# Patient Record
Sex: Female | Born: 1935
Health system: Southern US, Community
[De-identification: ages and names within clinical notes are randomized; demographics above are authoritative.]

## PROBLEM LIST (undated history)

## (undated) DIAGNOSIS — Z8719 Personal history of other diseases of the digestive system: Secondary | ICD-10-CM

## (undated) DIAGNOSIS — A159 Respiratory tuberculosis unspecified: Secondary | ICD-10-CM

## (undated) DIAGNOSIS — K222 Esophageal obstruction: Secondary | ICD-10-CM

## (undated) DIAGNOSIS — K5730 Diverticulosis of large intestine without perforation or abscess without bleeding: Secondary | ICD-10-CM

## (undated) DIAGNOSIS — F329 Major depressive disorder, single episode, unspecified: Secondary | ICD-10-CM

## (undated) DIAGNOSIS — I10 Essential (primary) hypertension: Secondary | ICD-10-CM

## (undated) DIAGNOSIS — M1990 Unspecified osteoarthritis, unspecified site: Secondary | ICD-10-CM

## (undated) DIAGNOSIS — D649 Anemia, unspecified: Secondary | ICD-10-CM

## (undated) DIAGNOSIS — G709 Myoneural disorder, unspecified: Secondary | ICD-10-CM

## (undated) DIAGNOSIS — E039 Hypothyroidism, unspecified: Secondary | ICD-10-CM

## (undated) DIAGNOSIS — F419 Anxiety disorder, unspecified: Secondary | ICD-10-CM

## (undated) DIAGNOSIS — C801 Malignant (primary) neoplasm, unspecified: Secondary | ICD-10-CM

## (undated) DIAGNOSIS — K219 Gastro-esophageal reflux disease without esophagitis: Secondary | ICD-10-CM

## (undated) DIAGNOSIS — K253 Acute gastric ulcer without hemorrhage or perforation: Secondary | ICD-10-CM

## (undated) DIAGNOSIS — K635 Polyp of colon: Secondary | ICD-10-CM

## (undated) DIAGNOSIS — F32A Depression, unspecified: Secondary | ICD-10-CM

## (undated) HISTORY — PX: EYE SURGERY: SHX253

## (undated) HISTORY — DX: Diverticulosis of large intestine without perforation or abscess without bleeding: K57.30

## (undated) HISTORY — PX: BREAST SURGERY: SHX581

## (undated) HISTORY — PX: MASTECTOMY: SHX3

## (undated) HISTORY — DX: Polyp of colon: K63.5

## (undated) HISTORY — DX: Esophageal obstruction: K22.2

## (undated) HISTORY — DX: Acute gastric ulcer without hemorrhage or perforation: K25.3

## (undated) HISTORY — PX: JOINT REPLACEMENT: SHX530

## (undated) HISTORY — PX: TONSILLECTOMY: SUR1361

---

## 1999-10-27 ENCOUNTER — Encounter: Admission: RE | Admit: 1999-10-27 | Discharge: 1999-11-30 | Payer: Self-pay | Admitting: Internal Medicine

## 1999-12-11 ENCOUNTER — Inpatient Hospital Stay (HOSPITAL_COMMUNITY): Admission: EM | Admit: 1999-12-11 | Discharge: 1999-12-13 | Payer: Self-pay

## 1999-12-11 ENCOUNTER — Encounter: Payer: Self-pay | Admitting: Gastroenterology

## 2000-01-04 DIAGNOSIS — K253 Acute gastric ulcer without hemorrhage or perforation: Secondary | ICD-10-CM

## 2000-01-04 DIAGNOSIS — Z8719 Personal history of other diseases of the digestive system: Secondary | ICD-10-CM

## 2000-01-04 DIAGNOSIS — K222 Esophageal obstruction: Secondary | ICD-10-CM

## 2000-01-04 HISTORY — DX: Esophageal obstruction: K22.2

## 2000-01-04 HISTORY — DX: Personal history of other diseases of the digestive system: Z87.19

## 2000-01-04 HISTORY — DX: Acute gastric ulcer without hemorrhage or perforation: K25.3

## 2000-02-16 ENCOUNTER — Encounter: Payer: Self-pay | Admitting: Gastroenterology

## 2000-02-16 ENCOUNTER — Other Ambulatory Visit: Admission: RE | Admit: 2000-02-16 | Discharge: 2000-02-16 | Payer: Self-pay | Admitting: Gastroenterology

## 2000-02-16 ENCOUNTER — Encounter (INDEPENDENT_AMBULATORY_CARE_PROVIDER_SITE_OTHER): Payer: Self-pay | Admitting: Specialist

## 2000-04-25 ENCOUNTER — Encounter: Payer: Self-pay | Admitting: Gastroenterology

## 2001-08-01 ENCOUNTER — Encounter: Payer: Self-pay | Admitting: Gastroenterology

## 2001-08-28 ENCOUNTER — Encounter: Payer: Self-pay | Admitting: Gastroenterology

## 2001-12-19 ENCOUNTER — Other Ambulatory Visit: Admission: RE | Admit: 2001-12-19 | Discharge: 2001-12-19 | Payer: Self-pay | Admitting: Obstetrics and Gynecology

## 2002-05-08 ENCOUNTER — Ambulatory Visit (HOSPITAL_COMMUNITY): Admission: RE | Admit: 2002-05-08 | Discharge: 2002-05-08 | Payer: Self-pay | Admitting: Obstetrics and Gynecology

## 2002-05-08 ENCOUNTER — Encounter (INDEPENDENT_AMBULATORY_CARE_PROVIDER_SITE_OTHER): Payer: Self-pay | Admitting: *Deleted

## 2003-11-18 ENCOUNTER — Ambulatory Visit: Payer: Self-pay | Admitting: Internal Medicine

## 2004-01-20 ENCOUNTER — Ambulatory Visit: Payer: Self-pay | Admitting: Internal Medicine

## 2004-01-26 ENCOUNTER — Ambulatory Visit (HOSPITAL_COMMUNITY): Admission: RE | Admit: 2004-01-26 | Discharge: 2004-01-26 | Payer: Self-pay | Admitting: Internal Medicine

## 2004-01-26 ENCOUNTER — Ambulatory Visit: Payer: Self-pay | Admitting: Internal Medicine

## 2004-02-05 ENCOUNTER — Ambulatory Visit: Payer: Self-pay

## 2004-09-16 ENCOUNTER — Ambulatory Visit: Payer: Self-pay | Admitting: Internal Medicine

## 2004-10-08 ENCOUNTER — Ambulatory Visit: Payer: Self-pay | Admitting: Physical Medicine & Rehabilitation

## 2004-10-08 ENCOUNTER — Inpatient Hospital Stay (HOSPITAL_COMMUNITY): Admission: RE | Admit: 2004-10-08 | Discharge: 2004-10-12 | Payer: Self-pay | Admitting: Orthopaedic Surgery

## 2004-10-12 ENCOUNTER — Inpatient Hospital Stay
Admission: RE | Admit: 2004-10-12 | Discharge: 2004-10-19 | Payer: Self-pay | Admitting: Physical Medicine & Rehabilitation

## 2004-11-22 ENCOUNTER — Ambulatory Visit: Payer: Self-pay | Admitting: Internal Medicine

## 2005-08-09 ENCOUNTER — Ambulatory Visit: Payer: Self-pay | Admitting: Internal Medicine

## 2005-09-01 ENCOUNTER — Ambulatory Visit: Payer: Self-pay | Admitting: Licensed Clinical Social Worker

## 2005-09-19 ENCOUNTER — Ambulatory Visit: Payer: Self-pay | Admitting: Licensed Clinical Social Worker

## 2005-10-10 ENCOUNTER — Ambulatory Visit: Payer: Self-pay | Admitting: Licensed Clinical Social Worker

## 2006-04-26 ENCOUNTER — Ambulatory Visit: Payer: Self-pay | Admitting: Internal Medicine

## 2006-04-27 LAB — CONVERTED CEMR LAB
ALT: 23 units/L (ref 0–40)
AST: 42 units/L — ABNORMAL HIGH (ref 0–37)
Albumin: 3.6 g/dL (ref 3.5–5.2)
Alkaline Phosphatase: 55 units/L (ref 39–117)
BUN: 20 mg/dL (ref 6–23)
Basophils Absolute: 0 10*3/uL (ref 0.0–0.1)
Basophils Relative: 0.6 % (ref 0.0–1.0)
Bilirubin, Direct: 0.2 mg/dL (ref 0.0–0.3)
CO2: 31 meq/L (ref 19–32)
Calcium: 9 mg/dL (ref 8.4–10.5)
Chloride: 107 meq/L (ref 96–112)
Cholesterol: 133 mg/dL (ref 0–200)
Creatinine, Ser: 1 mg/dL (ref 0.4–1.2)
Eosinophils Absolute: 0.4 10*3/uL (ref 0.0–0.6)
Eosinophils Relative: 7.4 % — ABNORMAL HIGH (ref 0.0–5.0)
GFR calc Af Amer: 70 mL/min
GFR calc non Af Amer: 58 mL/min
Glucose, Bld: 107 mg/dL — ABNORMAL HIGH (ref 70–99)
HCT: 36.5 % (ref 36.0–46.0)
HDL: 54.4 mg/dL (ref >39.0)
Hemoglobin: 12 g/dL (ref 12.0–15.0)
LDL Cholesterol: 67 mg/dL (ref 0–99)
Lymphocytes Relative: 22.9 % (ref 12.0–46.0)
MCHC: 33 g/dL (ref 30.0–36.0)
MCV: 90.1 fL (ref 78.0–100.0)
Monocytes Absolute: 0.6 10*3/uL (ref 0.2–0.7)
Monocytes Relative: 12.1 % — ABNORMAL HIGH (ref 3.0–11.0)
Neutro Abs: 2.9 10*3/uL (ref 1.4–7.7)
Neutrophils Relative %: 57 % (ref 43.0–77.0)
Platelets: 252 10*3/uL (ref 150–400)
Potassium: 4.6 meq/L (ref 3.5–5.1)
RBC: 4.05 M/uL (ref 3.87–5.11)
RDW: 13.1 % (ref 11.5–14.6)
Sodium: 143 meq/L (ref 135–145)
TSH: 1.97 microintl units/mL (ref 0.35–5.50)
Total Bilirubin: 1 mg/dL (ref 0.3–1.2)
Total CHOL/HDL Ratio: 2.4
Total Protein: 7.1 g/dL (ref 6.0–8.3)
Triglycerides: 56 mg/dL (ref 0–149)
VLDL: 11 mg/dL (ref 0–40)
WBC: 5.1 10*3/uL (ref 4.5–10.5)

## 2006-05-30 ENCOUNTER — Ambulatory Visit: Payer: Self-pay | Admitting: Internal Medicine

## 2006-06-13 ENCOUNTER — Ambulatory Visit: Payer: Self-pay | Admitting: Internal Medicine

## 2006-06-13 LAB — CONVERTED CEMR LAB: Tissue Transglutaminase Ab, IgA: <1 units (ref <7)

## 2006-11-20 ENCOUNTER — Encounter: Payer: Self-pay | Admitting: Internal Medicine

## 2006-12-18 ENCOUNTER — Telehealth: Payer: Self-pay | Admitting: Internal Medicine

## 2007-03-13 ENCOUNTER — Encounter: Payer: Self-pay | Admitting: *Deleted

## 2007-03-13 DIAGNOSIS — G609 Hereditary and idiopathic neuropathy, unspecified: Secondary | ICD-10-CM | POA: Insufficient documentation

## 2007-03-13 DIAGNOSIS — E039 Hypothyroidism, unspecified: Secondary | ICD-10-CM

## 2007-03-13 DIAGNOSIS — F32A Depression, unspecified: Secondary | ICD-10-CM | POA: Insufficient documentation

## 2007-03-13 DIAGNOSIS — L719 Rosacea, unspecified: Secondary | ICD-10-CM | POA: Insufficient documentation

## 2007-03-13 DIAGNOSIS — K5730 Diverticulosis of large intestine without perforation or abscess without bleeding: Secondary | ICD-10-CM | POA: Insufficient documentation

## 2007-03-13 DIAGNOSIS — Z8719 Personal history of other diseases of the digestive system: Secondary | ICD-10-CM | POA: Insufficient documentation

## 2007-03-13 DIAGNOSIS — Z9189 Other specified personal risk factors, not elsewhere classified: Secondary | ICD-10-CM | POA: Insufficient documentation

## 2007-03-13 DIAGNOSIS — R051 Acute cough: Secondary | ICD-10-CM | POA: Insufficient documentation

## 2007-03-13 DIAGNOSIS — F988 Other specified behavioral and emotional disorders with onset usually occurring in childhood and adolescence: Secondary | ICD-10-CM | POA: Insufficient documentation

## 2007-03-13 DIAGNOSIS — R05 Cough: Secondary | ICD-10-CM

## 2007-03-13 DIAGNOSIS — K219 Gastro-esophageal reflux disease without esophagitis: Secondary | ICD-10-CM | POA: Insufficient documentation

## 2007-03-13 DIAGNOSIS — F329 Major depressive disorder, single episode, unspecified: Secondary | ICD-10-CM | POA: Insufficient documentation

## 2007-03-13 DIAGNOSIS — C50919 Malignant neoplasm of unspecified site of unspecified female breast: Secondary | ICD-10-CM | POA: Insufficient documentation

## 2007-03-13 DIAGNOSIS — M1990 Unspecified osteoarthritis, unspecified site: Secondary | ICD-10-CM

## 2007-03-13 DIAGNOSIS — I10 Essential (primary) hypertension: Secondary | ICD-10-CM | POA: Insufficient documentation

## 2007-03-13 DIAGNOSIS — E785 Hyperlipidemia, unspecified: Secondary | ICD-10-CM | POA: Insufficient documentation

## 2007-03-13 DIAGNOSIS — Z96659 Presence of unspecified artificial knee joint: Secondary | ICD-10-CM

## 2007-09-19 ENCOUNTER — Ambulatory Visit: Payer: Self-pay | Admitting: Internal Medicine

## 2007-09-19 DIAGNOSIS — K900 Celiac disease: Secondary | ICD-10-CM | POA: Insufficient documentation

## 2007-09-19 LAB — CONVERTED CEMR LAB
ALT: 29 units/L (ref 0–35)
AST: 36 units/L (ref 0–37)
Albumin: 4 g/dL (ref 3.5–5.2)
Alkaline Phosphatase: 44 units/L (ref 39–117)
BUN: 20 mg/dL (ref 6–23)
Basophils Absolute: 0 10*3/uL (ref 0.0–0.1)
Basophils Relative: 1 % (ref 0.0–3.0)
Bilirubin, Direct: 0.2 mg/dL (ref 0.0–0.3)
CO2: 29 meq/L (ref 19–32)
Calcium: 9.3 mg/dL (ref 8.4–10.5)
Chloride: 106 meq/L (ref 96–112)
Cholesterol: 180 mg/dL (ref 0–200)
Creatinine, Ser: 1 mg/dL (ref 0.4–1.2)
Eosinophils Absolute: 0.2 10*3/uL (ref 0.0–0.7)
Eosinophils Relative: 5.1 % — ABNORMAL HIGH (ref 0.0–5.0)
GFR calc Af Amer: 70 mL/min
GFR calc non Af Amer: 57 mL/min
Glucose, Bld: 96 mg/dL (ref 70–99)
HCT: 32.7 % — ABNORMAL LOW (ref 36.0–46.0)
HDL: 67.4 mg/dL (ref >39.0)
Hemoglobin: 10.9 g/dL — ABNORMAL LOW (ref 12.0–15.0)
LDL Cholesterol: 87 mg/dL (ref 0–99)
Lymphocytes Relative: 23.7 % (ref 12.0–46.0)
MCHC: 33.2 g/dL (ref 30.0–36.0)
MCV: 83 fL (ref 78.0–100.0)
Monocytes Absolute: 0.8 10*3/uL (ref 0.1–1.0)
Monocytes Relative: 15.7 % — ABNORMAL HIGH (ref 3.0–12.0)
Neutro Abs: 2.7 10*3/uL (ref 1.4–7.7)
Neutrophils Relative %: 54.5 % (ref 43.0–77.0)
Platelets: 327 10*3/uL (ref 150–400)
Potassium: 4.6 meq/L (ref 3.5–5.1)
RBC: 3.94 M/uL (ref 3.87–5.11)
RDW: 15.9 % — ABNORMAL HIGH (ref 11.5–14.6)
Sodium: 140 meq/L (ref 135–145)
TSH: 2.65 microintl units/mL (ref 0.35–5.50)
Total Bilirubin: 1.3 mg/dL — ABNORMAL HIGH (ref 0.3–1.2)
Total CHOL/HDL Ratio: 2.7
Total Protein: 7.3 g/dL (ref 6.0–8.3)
Triglycerides: 128 mg/dL (ref 0–149)
VLDL: 26 mg/dL (ref 0–40)
WBC: 4.8 10*3/uL (ref 4.5–10.5)

## 2007-09-28 ENCOUNTER — Encounter: Payer: Self-pay | Admitting: Internal Medicine

## 2007-10-02 ENCOUNTER — Encounter: Payer: Self-pay | Admitting: Internal Medicine

## 2007-10-15 ENCOUNTER — Encounter: Payer: Self-pay | Admitting: Internal Medicine

## 2007-12-11 ENCOUNTER — Telehealth (INDEPENDENT_AMBULATORY_CARE_PROVIDER_SITE_OTHER): Payer: Self-pay | Admitting: *Deleted

## 2007-12-14 ENCOUNTER — Encounter: Payer: Self-pay | Admitting: Internal Medicine

## 2007-12-17 ENCOUNTER — Encounter: Payer: Self-pay | Admitting: Internal Medicine

## 2008-01-04 DIAGNOSIS — K635 Polyp of colon: Secondary | ICD-10-CM

## 2008-01-04 HISTORY — DX: Polyp of colon: K63.5

## 2008-04-10 ENCOUNTER — Encounter: Payer: Self-pay | Admitting: Internal Medicine

## 2008-04-12 ENCOUNTER — Encounter: Payer: Self-pay | Admitting: Internal Medicine

## 2008-04-14 ENCOUNTER — Telehealth: Payer: Self-pay | Admitting: Internal Medicine

## 2008-04-14 ENCOUNTER — Ambulatory Visit: Payer: Self-pay | Admitting: Internal Medicine

## 2008-04-14 LAB — CONVERTED CEMR LAB
Basophils Absolute: 0.1 10*3/uL (ref 0.0–0.1)
Basophils Relative: 1.3 % (ref 0.0–3.0)
Eosinophils Absolute: 0.2 10*3/uL (ref 0.0–0.7)
Eosinophils Relative: 4.2 % (ref 0.0–5.0)
HCT: 33.7 % — ABNORMAL LOW (ref 36.0–46.0)
Hemoglobin: 10.9 g/dL — ABNORMAL LOW (ref 12.0–15.0)
Lymphocytes Relative: 25.5 % (ref 12.0–46.0)
Lymphs Abs: 1.4 10*3/uL (ref 0.7–4.0)
MCHC: 32.3 g/dL (ref 30.0–36.0)
MCV: 78.2 fL (ref 78.0–100.0)
Monocytes Absolute: 0.6 10*3/uL (ref 0.1–1.0)
Monocytes Relative: 10.7 % (ref 3.0–12.0)
Neutro Abs: 3.3 10*3/uL (ref 1.4–7.7)
Neutrophils Relative %: 58.3 % (ref 43.0–77.0)
Platelets: 424 10*3/uL — ABNORMAL HIGH (ref 150.0–400.0)
RBC: 4.31 M/uL (ref 3.87–5.11)
RDW: 15.5 % — ABNORMAL HIGH (ref 11.5–14.6)
WBC: 5.6 10*3/uL (ref 4.5–10.5)

## 2008-04-15 ENCOUNTER — Telehealth: Payer: Self-pay | Admitting: Internal Medicine

## 2008-04-17 ENCOUNTER — Ambulatory Visit: Payer: Self-pay | Admitting: Internal Medicine

## 2008-04-17 DIAGNOSIS — K319 Disease of stomach and duodenum, unspecified: Secondary | ICD-10-CM | POA: Insufficient documentation

## 2008-04-17 DIAGNOSIS — D649 Anemia, unspecified: Secondary | ICD-10-CM | POA: Insufficient documentation

## 2008-04-17 DIAGNOSIS — Z91013 Allergy to seafood: Secondary | ICD-10-CM

## 2008-04-17 DIAGNOSIS — K279 Peptic ulcer, site unspecified, unspecified as acute or chronic, without hemorrhage or perforation: Secondary | ICD-10-CM | POA: Insufficient documentation

## 2008-04-17 LAB — CONVERTED CEMR LAB
Basophils Absolute: 0.1 10*3/uL (ref 0.0–0.1)
Basophils Relative: 2.3 % (ref 0.0–3.0)
CEA: 1.5 ng/mL (ref 0.0–5.0)
Eosinophils Absolute: 0.2 10*3/uL (ref 0.0–0.7)
Eosinophils Relative: 4.3 % (ref 0.0–5.0)
Folate: >20 ng/mL
HCT: 31.3 % — ABNORMAL LOW (ref 36.0–46.0)
Hemoglobin: 10.6 g/dL — ABNORMAL LOW (ref 12.0–15.0)
Iron: 71 ug/dL (ref 42–145)
Lymphocytes Relative: 26.3 % (ref 12.0–46.0)
Lymphs Abs: 1.4 10*3/uL (ref 0.7–4.0)
MCHC: 33.8 g/dL (ref 30.0–36.0)
MCV: 77.8 fL — ABNORMAL LOW (ref 78.0–100.0)
Monocytes Absolute: 0.6 10*3/uL (ref 0.1–1.0)
Monocytes Relative: 10.4 % (ref 3.0–12.0)
Neutro Abs: 3 10*3/uL (ref 1.4–7.7)
Neutrophils Relative %: 56.7 % (ref 43.0–77.0)
Platelets: 399 10*3/uL (ref 150.0–400.0)
RBC: 4.03 M/uL (ref 3.87–5.11)
RDW: 15.8 % — ABNORMAL HIGH (ref 11.5–14.6)
Retic Ct Pct: 1.1 % (ref 0.4–3.1)
Saturation Ratios: 12.2 % — ABNORMAL LOW (ref 20.0–50.0)
Transferrin: 414 mg/dL — ABNORMAL HIGH (ref 212.0–360.0)
Vitamin B-12: 412 pg/mL (ref 211–911)
WBC: 5.3 10*3/uL (ref 4.5–10.5)

## 2008-04-22 ENCOUNTER — Ambulatory Visit: Payer: Self-pay | Admitting: Gastroenterology

## 2008-04-22 DIAGNOSIS — D509 Iron deficiency anemia, unspecified: Secondary | ICD-10-CM | POA: Insufficient documentation

## 2008-04-22 DIAGNOSIS — R1032 Left lower quadrant pain: Secondary | ICD-10-CM | POA: Insufficient documentation

## 2008-05-02 ENCOUNTER — Ambulatory Visit: Payer: Self-pay | Admitting: Gastroenterology

## 2008-05-02 ENCOUNTER — Encounter: Payer: Self-pay | Admitting: Gastroenterology

## 2008-05-07 ENCOUNTER — Encounter: Payer: Self-pay | Admitting: Gastroenterology

## 2008-07-02 ENCOUNTER — Telehealth: Payer: Self-pay | Admitting: Internal Medicine

## 2008-08-21 ENCOUNTER — Ambulatory Visit: Payer: Self-pay | Admitting: Internal Medicine

## 2008-08-21 DIAGNOSIS — R109 Unspecified abdominal pain: Secondary | ICD-10-CM

## 2008-08-22 ENCOUNTER — Ambulatory Visit: Payer: Self-pay | Admitting: Internal Medicine

## 2008-08-22 LAB — CONVERTED CEMR LAB
Bilirubin Urine: NEGATIVE
Hemoglobin, Urine: NEGATIVE
Ketones, ur: NEGATIVE mg/dL
Nitrite: NEGATIVE
Specific Gravity, Urine: 1.02 (ref 1.000–1.030)
Total Protein, Urine: NEGATIVE mg/dL
Urine Glucose: NEGATIVE mg/dL
Urobilinogen, UA: 0.2 (ref 0.0–1.0)
pH: 6 (ref 5.0–8.0)

## 2008-09-01 ENCOUNTER — Telehealth: Payer: Self-pay | Admitting: Internal Medicine

## 2008-09-02 ENCOUNTER — Ambulatory Visit: Payer: Self-pay | Admitting: Internal Medicine

## 2008-09-09 ENCOUNTER — Telehealth: Payer: Self-pay | Admitting: Internal Medicine

## 2008-09-15 ENCOUNTER — Telehealth: Payer: Self-pay | Admitting: Internal Medicine

## 2008-09-19 ENCOUNTER — Ambulatory Visit: Payer: Self-pay | Admitting: Internal Medicine

## 2008-09-26 ENCOUNTER — Telehealth: Payer: Self-pay | Admitting: Internal Medicine

## 2008-10-24 ENCOUNTER — Encounter: Payer: Self-pay | Admitting: Internal Medicine

## 2009-01-15 ENCOUNTER — Ambulatory Visit: Payer: Self-pay | Admitting: Internal Medicine

## 2009-01-16 ENCOUNTER — Inpatient Hospital Stay (HOSPITAL_COMMUNITY): Admission: EM | Admit: 2009-01-16 | Discharge: 2009-01-22 | Payer: Self-pay | Admitting: Internal Medicine

## 2009-01-16 ENCOUNTER — Ambulatory Visit: Payer: Self-pay | Admitting: Internal Medicine

## 2009-01-16 ENCOUNTER — Encounter: Payer: Self-pay | Admitting: Internal Medicine

## 2009-01-22 ENCOUNTER — Encounter: Payer: Self-pay | Admitting: Internal Medicine

## 2009-01-22 ENCOUNTER — Telehealth: Payer: Self-pay | Admitting: Internal Medicine

## 2009-01-23 ENCOUNTER — Encounter: Payer: Self-pay | Admitting: Internal Medicine

## 2009-01-29 ENCOUNTER — Ambulatory Visit: Payer: Self-pay | Admitting: Internal Medicine

## 2009-02-06 ENCOUNTER — Telehealth: Payer: Self-pay | Admitting: Internal Medicine

## 2009-02-20 ENCOUNTER — Ambulatory Visit: Payer: Self-pay | Admitting: Internal Medicine

## 2009-07-21 ENCOUNTER — Telehealth: Payer: Self-pay | Admitting: Internal Medicine

## 2009-08-20 ENCOUNTER — Encounter: Payer: Self-pay | Admitting: Internal Medicine

## 2009-09-24 ENCOUNTER — Ambulatory Visit: Payer: Self-pay | Admitting: Internal Medicine

## 2009-10-12 ENCOUNTER — Telehealth: Payer: Self-pay | Admitting: Internal Medicine

## 2009-10-26 ENCOUNTER — Encounter: Payer: Self-pay | Admitting: Internal Medicine

## 2009-10-28 ENCOUNTER — Telehealth: Payer: Self-pay | Admitting: Internal Medicine

## 2009-11-03 ENCOUNTER — Ambulatory Visit: Payer: Self-pay | Admitting: Internal Medicine

## 2009-11-03 ENCOUNTER — Encounter: Payer: Self-pay | Admitting: Internal Medicine

## 2009-11-03 LAB — CONVERTED CEMR LAB
ALT: 22 units/L (ref 0–35)
AST: 28 units/L (ref 0–37)
Albumin: 4.1 g/dL (ref 3.5–5.2)
Alkaline Phosphatase: 54 units/L (ref 39–117)
BUN: 25 mg/dL — ABNORMAL HIGH (ref 6–23)
Basophils Absolute: 0 10*3/uL (ref 0.0–0.1)
Basophils Relative: 0.5 % (ref 0.0–3.0)
Bilirubin, Direct: 0.1 mg/dL (ref 0.0–0.3)
CO2: 30 meq/L (ref 19–32)
Calcium: 9.4 mg/dL (ref 8.4–10.5)
Chloride: 101 meq/L (ref 96–112)
Cholesterol: 155 mg/dL (ref 0–200)
Creatinine, Ser: 1 mg/dL (ref 0.4–1.2)
Eosinophils Absolute: 0.3 10*3/uL (ref 0.0–0.7)
Eosinophils Relative: 4.2 % (ref 0.0–5.0)
GFR calc non Af Amer: 60.6 mL/min (ref >60)
Glucose, Bld: 87 mg/dL (ref 70–99)
HCT: 31.7 % — ABNORMAL LOW (ref 36.0–46.0)
HDL: 70.8 mg/dL (ref >39.00)
Hemoglobin: 10.8 g/dL — ABNORMAL LOW (ref 12.0–15.0)
LDL Cholesterol: 57 mg/dL (ref 0–99)
Lymphocytes Relative: 28.3 % (ref 12.0–46.0)
Lymphs Abs: 1.8 10*3/uL (ref 0.7–4.0)
MCHC: 33.9 g/dL (ref 30.0–36.0)
MCV: 86.7 fL (ref 78.0–100.0)
Monocytes Absolute: 0.9 10*3/uL (ref 0.1–1.0)
Monocytes Relative: 13.4 % — ABNORMAL HIGH (ref 3.0–12.0)
Neutro Abs: 3.5 10*3/uL (ref 1.4–7.7)
Neutrophils Relative %: 53.6 % (ref 43.0–77.0)
Platelets: 322 10*3/uL (ref 150.0–400.0)
Potassium: 4.3 meq/L (ref 3.5–5.1)
RBC: 3.66 M/uL — ABNORMAL LOW (ref 3.87–5.11)
RDW: 15.8 % — ABNORMAL HIGH (ref 11.5–14.6)
Sodium: 138 meq/L (ref 135–145)
TSH: 2.71 microintl units/mL (ref 0.35–5.50)
Total Bilirubin: 1.1 mg/dL (ref 0.3–1.2)
Total CHOL/HDL Ratio: 2
Total Protein: 7.6 g/dL (ref 6.0–8.3)
Triglycerides: 134 mg/dL (ref 0.0–149.0)
VLDL: 26.8 mg/dL (ref 0.0–40.0)
WBC: 6.5 10*3/uL (ref 4.5–10.5)

## 2009-11-11 ENCOUNTER — Telehealth: Payer: Self-pay | Admitting: Internal Medicine

## 2010-02-02 NOTE — Progress Notes (Signed)
Summary: Omeprazole PA  Phone Note From Pharmacy   Call[facility]  [address] [phone][facility]  [phone] Call For: ID:  [id number]  Case:  [id number]  Summary of Call: PA request--Omeprazole 20mg  two times a day. Per [employer] this could not be completed at this time. Must do via phone, it is required due to qty limit of 90 for every 180 days. They will fax forms. Initial call taken by: Lucious Groves,  January 22, 2009 9:23 AM  Follow-up for Phone Call        form completed and awaiting signature. Follow-up by: Lucious Groves,  January 22, 2009 10:08 AM  Additional Follow-up for Phone Call Additional follow up Details #1::        Done Additional Follow-up by: Jacques Navy MD,  January 22, 2009 12:44 PM     Appended Document: Omeprazole PA Faxed to [facility], will wait for reply.  Appended Document: Omeprazole PA Per automated system--approved until 2012.

## 2010-02-02 NOTE — Progress Notes (Signed)
  Phone Note Refill Request   Refills Requested: Medication #1:  SYNTHROID 50 MCG TABS Take once daily  Medication #2:  CITALOPRAM HYDROBROMIDE 20 MG  TABS Take one tablet once daily  Medication #3:  SIMVASTATIN 20 MG TABS 1 q PM  Medication #4:  AMITRIPTYLINE HCL 25 MG  TABS Take 1-3 at bedtime Initial call taken by: Ami Bullins CMA,  October 12, 2009 4:24 PM    Prescriptions: CITALOPRAM HYDROBROMIDE 20 MG  TABS (CITALOPRAM HYDROBROMIDE) Take one tablet once daily  #90 x 3   Entered by:   Ami Bullins CMA   Authorized by:   Jacques Navy MD   Signed by:   Bill Salinas CMA on 10/12/2009   Method used:   Faxed to ...       [facility] (mail-order)             , [place]         Ph: [phone]       Fax: [phone]   RxID:   [id number] SYNTHROID 50 MCG TABS (LEVOTHYROXINE SODIUM) Take once daily  #90 x 3   Entered by:   Ami Bullins CMA   Authorized by:   Jacques Navy MD   Signed by:   Bill Salinas CMA on 10/12/2009   Method used:   Faxed to ...       [facility] (mail-order)             , [place]         Ph: [phone]       Fax: [phone]   RxID:   [id number] AMITRIPTYLINE HCL 25 MG  TABS (AMITRIPTYLINE HCL) Take 1-3 at bedtime  #180 x 3   Entered by:   Ami Bullins CMA   Authorized by:   Jacques Navy MD   Signed by:   Bill Salinas CMA on 10/12/2009   Method used:   Faxed to ...       [facility] (mail-order)             , [place]         Ph: [phone]       Fax: [phone]   RxID:   [id number] SIMVASTATIN 20 MG TABS (SIMVASTATIN) 1 q PM  #90 x 3   Entered by:   Bill Salinas CMA   Authorized by:   Jacques Navy MD   Signed by:   Bill Salinas CMA on 10/12/2009   Method used:   Faxed to ...       [facility] (mail-order)             , [place]         Ph: [phone]       Fax: [phone]   RxID:   [id number] HYDROCHLOROTHIAZIDE 25 MG TABS (HYDROCHLOROTHIAZIDE) Take once daily  #90 x 3   Entered by:   Ami Bullins CMA   Authorized by:   Jacques Navy MD    Signed by:   Bill Salinas CMA on 10/12/2009   Method used:   Faxed to ...       [facility] (mail-order)             , [place]         Ph: [phone]       Fax: [phone]   RxID:   [id number]

## 2010-02-02 NOTE — Letter (Signed)
Summary: Pt fell/[facility]  Pt fell/[facility]   Imported By: Sherian Rein 01/19/2009 13:16:38  _____________________________________________________________________  External Attachment:    Type:   Image     Comment:   External Document

## 2010-02-02 NOTE — Medication Information (Signed)
Summary: Prior Auth for Omeprazole/[facility]  Prior Auth for Omeprazole/[facility]   Imported By: Sherian Rein 01/27/2009 15:37:08  _____________________________________________________________________  External Attachment:    Type:   Image     Comment:   External Document

## 2010-02-02 NOTE — Assessment & Plan Note (Signed)
Summary: f/u arm infection/#/cd   Vital Signs:  Patient profile:   77 year old female Height:      67 inches Weight:      220 pounds BMI:     34.58 O2 Sat:      97 % on Room air Temp:     97.8 degrees F oral Pulse rate:   66 / minute BP sitting:   122 / 78  (left arm) Cuff size:   large  Vitals Entered By: Bill Salinas CMA (February 20, 2009 2:58 PM)  O2 Flow:  Room air CC: pt here for follow up with right arm infection/ ab   Primary Care Provider:  Jacques Navy MD  CC:  pt here for follow up with right arm infection/ ab.  History of Present Illness: Patient presents for follow-up of cellulitis of the right UE. she has completed an extended course of oral doxycycline. She has had no fever, arm pain but there remains a 1cm nodule over the biceps and there is erythema of the proximal UE.  Current Medications (verified): 1)  Hydrochlorothiazide 25 Mg Tabs (Hydrochlorothiazide) .... Take Once Daily 2)  Simvastatin 20 Mg Tabs (Simvastatin) .Marland Kitchen.. 1 Q Pm 3)  Synthroid 50 Mcg Tabs (Levothyroxine Sodium) .... Take Once Daily 4)  Ranitidine Hcl 150 Mg Caps (Ranitidine Hcl) .Marland Kitchen.. 1 By Mouth Two Times A Day 5)  Amitriptyline Hcl 25 Mg  Tabs (Amitriptyline Hcl) .... Take 1-3 At Bedtime 6)  Citalopram Hydrobromide 20 Mg  Tabs (Citalopram Hydrobromide) .... Take One Tablet Once Daily 7)  Fish Oil   Oil (Fish Oil) .... Take Once Daily 8)  Multivitamins   Tabs (Multiple Vitamin) .... Take Once Daily  Allergies (verified): 1)  ! Iodine 2)  * Nexium 3)  * Shellfish PMH-FH-SH reviewed-no changes except otherwise noted  Review of Systems  The patient denies anorexia, fever, weight loss, decreased hearing, chest pain, syncope, peripheral edema, abdominal pain, muscle weakness, difficulty walking, and enlarged lymph nodes.    Physical Exam  General:  overweight white female in no distress Msk:  right proximal upper extremity with erythema over the biceps, a small non-tender 1 cm nodule  over the biceps. The erythematous area is not warm to touch compared to the left.   Impression & Recommendations:  Problem # 1:  CELLULITIS, ARM (ICD-682.3) The cellulitis of the right proximal UE seems resolved despite some residual erythema. NO indication for additional antibiotics. She may resume wearing an arm sleeve.   Complete Medication List: 1)  Hydrochlorothiazide 25 Mg Tabs (Hydrochlorothiazide) .... Take once daily 2)  Simvastatin 20 Mg Tabs (Simvastatin) .Marland Kitchen.. 1 q pm 3)  Synthroid 50 Mcg Tabs (Levothyroxine sodium) .... Take once daily 4)  Ranitidine Hcl 150 Mg Caps (Ranitidine hcl) .Marland Kitchen.. 1 by mouth two times a day 5)  Amitriptyline Hcl 25 Mg Tabs (Amitriptyline hcl) .... Take 1-3 at bedtime 6)  Citalopram Hydrobromide 20 Mg Tabs (Citalopram hydrobromide) .... Take one tablet once daily 7)  Fish Oil Oil (Fish oil) .... Take once daily 8)  Multivitamins Tabs (Multiple vitamin) .... Take once daily   Preventive Care Screening  Mammogram:    Date:  10/24/2008    Results:  normal   Last Flu Shot:    Date:  10/04/2008    Results:  given   Last Pneumovax:    Date:  02/13/2004    Results:  given

## 2010-02-02 NOTE — Medication Information (Signed)
Summary: Omeprazole approved/[facility]  Omeprazole approved/[facility]   Imported By: Sherian Rein 02/06/2009 07:14:31  _____________________________________________________________________  External Attachment:    Type:   Image     Comment:   External Document

## 2010-02-02 NOTE — Progress Notes (Signed)
    Preventive Care Screening  Mammogram:    Date:  10/26/2009    Results:  normal bilateral

## 2010-02-02 NOTE — Progress Notes (Signed)
Summary: Infection Update  Phone Note Call from Patient Call back at Home Phone [phone]   Summary of Call: Patient called in regards to the infection in her arm. Per the patient she has finished the antibiotics, and the area of her arm has decreased to half the size it was previously. Patient states that is it still red, hot, and sore. Patient would like to know if she should do another round of antibiotics or come into the office for eval. Please advise. Initial call taken by: Lucious Groves,  February 06, 2009 10:29 AM  Follow-up for Phone Call        continue warm compresses. Repeat course of doxycycline 100mg  two times a day x 10 days = #20 Follow-up by: Jacques Navy MD,  February 06, 2009 3:33 PM  Additional Follow-up for Phone Call Additional follow up Details #1::        Patient notified. Additional Follow-up by: Lucious Groves,  February 06, 2009 4:09 PM    New/Updated Medications: DOXYCYCLINE HYCLATE 100 MG CAPS (DOXYCYCLINE HYCLATE) 1 by mouth bid Prescriptions: DOXYCYCLINE HYCLATE 100 MG CAPS (DOXYCYCLINE HYCLATE) 1 by mouth bid  #20 x 0   Entered by:   Lucious Groves   Authorized by:   Jacques Navy MD   Signed by:   Lucious Groves on 02/06/2009   Method used:   [facility]        [facility]  [address] [phone][facility] (retail)       [address]       [place], [place]  [address]       Ph: [phone] or [phone]       Fax: [phone]   RxID:   [id number]

## 2010-02-02 NOTE — Assessment & Plan Note (Signed)
Summary: FU ON MEDS/NWS   Vital Signs:  Patient profile:   77 year old female Height:      67 inches Weight:      226 pounds BMI:     35.52 O2 Sat:      98 % on Room air Temp:     97.4 degrees F oral Pulse rate:   78 / minute BP sitting:   126 / 78  (left arm) Cuff size:   large  Vitals Entered By: Ami Bullins CMA (November 03, 2009 3:10 PM)  O2 Flow:  Room air CC: pt here for follow up/ ab  Vision Screening:      Vision Comments: Normal eye exam october 2011 with Dr Dagoberto Ligas   Primary Care Provider:  Jacques Navy MD  CC:  pt here for follow up/ ab.  History of Present Illness: Mrs. Gras presents today for a follow up on medication and for her anual physical.  She has no complaints today.  She feels that her chronic conditions are well controlled and has been taking all her medication as directed.  On falls assesment she did report a fall last weekend.  She was on a step stool and when getting down she lost her balance because she says she wasn't paying attention slipped and landed on her buttocks.  She denies any soreness or pain as a result of the fall.  She feels very safe at home and has no broblem with her balance or her ability to walk.  She states she fell mainly because she was clumsy not because she felt unstable.    She reports being able to manage all of her finances and house hold chores.  She is able to live independently safely.  She denies any depressed feeling or suicidal ideation, and is very happy with her current regimen of anti-depressants.    She has had her mammogram for the year and states that there were no abnormalities found.  Preventive Screening-Counseling & Management  Alcohol-Tobacco     Alcohol drinks/day: <1     Alcohol type: wine     >5/day in last 3 mos: yes     Smoking Status: never  Caffeine-Diet-Exercise     Caffeine use/day: 2 cups per day     Does Patient Exercise: no  Hep-HIV-STD-Contraception     Dental Visit-last 6  months yes     Sun Exposure-Excessive: no  Safety-Violence-Falls     Seat Belt Use: yes     Helmet Use: n/a     Firearms in the Home: firearms in the home     Smoke Detectors: yes     Violence in the Home: no risk noted     Sexual Abuse: no     Fall Risk: small fall risk  Current Medications (verified): 1)  Hydrochlorothiazide 25 Mg Tabs (Hydrochlorothiazide) .... Take Once Daily 2)  Simvastatin 20 Mg Tabs (Simvastatin) .Marland Kitchen.. 1 Q Pm 3)  Synthroid 50 Mcg Tabs (Levothyroxine Sodium) .... Take Once Daily 4)  Ranitidine Hcl 150 Mg Caps (Ranitidine Hcl) .Marland Kitchen.. 1 By Mouth Two Times A Day 5)  Amitriptyline Hcl 25 Mg  Tabs (Amitriptyline Hcl) .... Take 1-3 At Bedtime 6)  Citalopram Hydrobromide 20 Mg  Tabs (Citalopram Hydrobromide) .... Take One Tablet Once Daily 7)  Fish Oil   Oil (Fish Oil) .... Take Once Daily 8)  Multivitamins   Tabs (Multiple Vitamin) .... Take Once Daily  Allergies (verified): 1)  ! Iodine 2)  *  Nexium 3)  * Shellfish  Past History:  Past Medical History: Last updated: 04/22/2008 CELIAC DISEASE (ICD-579.0) Hx of DIVERTICULOSIS, COLON (ICD-562.10) BREAST BIOPSY, HX OF X2 (ICD-V15.9) PERIPHERAL NEUROPATHY (ICD-356.9) TONSILLECTOMY, UNDER AGE 75, HX OF (ICD-V15.89) MASTECTOMY, MODIFIED RADICAL, HX OF RIGHT (ICD-V45.71) Hx of ADENOCARCINOMA, BREAST, RIGHT (ICD-174.9) GASTROINTESTINAL HEMORRHAGE, HX OF (ICD-V12.79) HYPOTHYROIDISM (ICD-244.9) HYPERLIPIDEMIA (ICD-272.4) KNEE REPLACEMENT, LEFT, HX OF (ICD-V43.65) DEGENERATIVE JOINT DISEASE (ICD-715.90) DEPRESSION (ICD-311) ATTENTION DEFICIT DISORDER (ICD-314.00) ROSACEA (ICD-695.3) Hx of COUGH, CHRONIC (ICD-786.2) GASTROESOPHAGEAL REFLUX DISEASE (ICD-530.81) HYPERTENSION (ICD-401.9) Esophageal stricture 2001 Gastric ulcer with bleeding 2001  Past Surgical History: Last updated: 03/13/2007 BREAST BIOPSY, HX OF X2 (ICD-V15.9) TONSILLECTOMY, UNDER AGE 77, HX OF (ICD-V15.89) MASTECTOMY, MODIFIED RADICAL, HX OF  RIGHT (ICD-V45.71) KNEE REPLACEMENT, LEFT, HX OF (ICD-V43.65)  Family History: Last updated: 04/17/2008 Lung cancer: father Family History of Heart Disease: Mother  Social History: Last updated: 04/22/2008 Married Patient has never smoked.  Alcohol Use - no Daily Caffeine Use 1 per day Illicit Drug Use - no Patient does not get regular exercise.   Risk Factors: Alcohol Use: <1 (11/03/2009) >5 drinks/d w/in last 3 months: yes (11/03/2009) Caffeine Use: 2 cups per day (11/03/2009) Exercise: no (11/03/2009)  Risk Factors: Smoking Status: never (11/03/2009)  Social History: Caffeine use/day:  2 cups per day Dental Care w/in 6 mos.:  yes Sun Exposure-Excessive:  no Seat Belt Use:  yes Fall Risk:  small fall risk  Physical Exam  General:  alert, well-developed, and well-nourished.   Head:  normocephalic and atraumatic.   Eyes:  pupils equal, pupils round, pupils reactive to light, and corneas and lenses clear.   Ears:  no external deformities, R cerumen impaction, and L Cerumen impaction.   Nose:  no external deformity, no nasal discharge, no mucosal pallor, no mucosal edema, and no airflow obstruction.   Mouth:  good dentition, pharynx pink and moist, no erythema, and no exudates.   Neck:  supple, full ROM, and no masses.   Lungs:  normal respiratory effort, normal breath sounds, no crackles, and no wheezes.   Heart:  normal rate, regular rhythm, no murmur, no gallop, and no rub.   Abdomen:  soft, non-tender, normal bowel sounds, no distention, no masses, no guarding, no rigidity, no abdominal hernia, and no hepatomegaly.   Msk:  normal ROM, no joint tenderness, no joint swelling, and no joint warmth.   Pulses:  2+ radial, 1+ PT and DP bilaterally Extremities:  1+ left pedal edema and 1+ right pedal edema.   Neurologic:  alert & oriented X3, cranial nerves II-XII intact, strength normal in all extremities, sensation intact to light touch, sensation intact to pinprick, and  gait normal.   Skin:  turgor normal, color normal, and no rashes.   Cervical Nodes:  no anterior cervical adenopathy and no posterior cervical adenopathy.   Psych:  Oriented X3, memory intact for recent and remote, normally interactive, good eye contact, not anxious appearing, and not depressed appearing.     Impression & Recommendations:  Problem # 1:  ROUTINE GENERAL MEDICAL EXAM@HEALTH  CARE FACL (ICD-V70.0) Mrs. Verville presents today with no complaints.  Interval history is negative for any acute illnes, surgery or injury. Her phsical exam is normal. Her lab results are within normal limits. Last mammogram October 24, '11. Last colonoscopy August '03. Immunizations: Pnemovax Sept '11.   She is 100% independent and able to perform ALDs such as managing her finances.  She is able to interact socially and does not have any signs  of depression.      Orders: Medicare -1st Annual Wellness Visit [phone])  Problem # 2:  HYPOTHYROIDISM (ICD-244.9) The patient only complains of occasional dry skin, but other than that has been taking her Synthroid as directed and is not showing any signs of hypo or hyperthyroidism.  Plan:  Continue current medication           Check TFT  Her updated medication list for this problem includes:    Synthroid 50 Mcg Tabs (Levothyroxine sodium) .Marland Kitchen... Take once daily  Orders: TLB-TSH (Thyroid Stimulating Hormone) (84443-TSH)  Problem # 3:  HYPERLIPIDEMIA (ICD-272.4) She has been taking her medication as instructed.  BEcause she is on Simvastatin she needs a liver function test as well.  Plan:  Continue current medication           Check Lipid panel and LFTs.  Her updated medication list for this problem includes:    Simvastatin 20 Mg Tabs (Simvastatin) .Marland Kitchen... 1 q pm  Orders: TLB-Lipid Panel (80061-LIPID) TLB-Hepatic/Liver Function Pnl (80076-HEPATIC)  Addendum - LDL 57  =  great control  Problem # 4:  HYPERTENSION (ICD-401.9) The patients blood pressure  is well controlled with her BP being 126/78 at todays visit.  Plan:  Continue current medication           Check BMP  Her updated medication list for this problem includes:    Hydrochlorothiazide 25 Mg Tabs (Hydrochlorothiazide) .Marland Kitchen... Take once daily  Orders: TLB-BMP (Basic Metabolic Panel-BMET) (80048-METABOL)  Complete Medication List: 1)  Hydrochlorothiazide 25 Mg Tabs (Hydrochlorothiazide) .... Take once daily 2)  Simvastatin 20 Mg Tabs (Simvastatin) .Marland Kitchen.. 1 q pm 3)  Synthroid 50 Mcg Tabs (Levothyroxine sodium) .... Take once daily 4)  Ranitidine Hcl 150 Mg Caps (Ranitidine hcl) .Marland Kitchen.. 1 by mouth two times a day 5)  Amitriptyline Hcl 25 Mg Tabs (Amitriptyline hcl) .... Take 1-3 at bedtime 6)  Citalopram Hydrobromide 20 Mg Tabs (Citalopram hydrobromide) .... Take one tablet once daily 7)  Fish Oil Oil (Fish oil) .... Take once daily 8)  Multivitamins Tabs (Multiple vitamin) .... Take once daily  Other Orders: TLB-CBC Platelet - w/Differential (85025-CBCD)  Patient: Danelia Marriott Note: All result statuses are Final unless otherwise noted.  Tests: (1) CBC Platelet w/Diff (CBCD)   White Cell Count          6.5 K/uL                    4.5-10.5   Red Cell Count       [L]  3.66 Mil/uL                 3.87-5.11   Hemoglobin           [L]  10.8 g/dL                   60.4-54.0   Hematocrit           [L]  31.7 %                      36.0-46.0   MCV                       86.7 fl                     78.0-100.0   MCHC  33.9 g/dL                   60.4-54.0   RDW                  [H]  15.8 %                      11.5-14.6   Platelet Count            322.0 K/uL                  150.0-400.0   Neutrophil %              53.6 %                      43.0-77.0   Lymphocyte %              28.3 %                      12.0-46.0   Monocyte %           [H]  13.4 %                      3.0-12.0   Eosinophils%              4.2 %                       0.0-5.0   Basophils %                0.5 %                       0.0-3.0   Neutrophill Absolute      3.5 K/uL                    1.4-7.7   Lymphocyte Absolute       1.8 K/uL                    0.7-4.0   Monocyte Absolute         0.9 K/uL                    0.1-1.0  Eosinophils, Absolute                             0.3 K/uL                    0.0-0.7   Basophils Absolute        0.0 K/uL                    0.0-0.1  Tests: (2) TSH (TSH)   FastTSH                   2.71 uIU/mL                 0.35-5.50  Tests: (3) Lipid Panel (LIPID)   Cholesterol               155 mg/dL                   9-811     ATP III Classification            Desirable:  <  200 mg/dL                    Borderline High:  200 - 239 mg/dL               High:  > = 240 mg/dL   Triglycerides             134.0 mg/dL                 1.6-109.6     Normal:  <150 mg/dL     Borderline High:  045 - 199 mg/dL   HDL                       40.98 mg/dL                 >11.91   VLDL Cholesterol          26.8 mg/dL                  4.7-82.9   LDL Cholesterol           57 mg/dL                    5-62  CHO/HDL Ratio:  CHD Risk                             2                    Men          Women     1/2 Average Risk     3.4          3.3     Average Risk          5.0          4.4     2X Average Risk          9.6          7.1     3X Average Risk          15.0          11.0                           Tests: (4) Hepatic/Liver Function Panel (HEPATIC)   Total Bilirubin           1.1 mg/dL                   1.3-0.8   Direct Bilirubin          0.1 mg/dL                   6.5-7.8   Alkaline Phosphatase      54 U/L                      39-117   AST                       28 U/L                      0-37   ALT                       22 U/L  0-35   Total Protein             7.6 g/dL                    1.6-1.0   Albumin                   4.1 g/dL                    9.6-0.4  Tests: (5) BMP (METABOL)   Sodium                    138 mEq/L                    135-145   Potassium                 4.3 mEq/L                   3.5-5.1   Chloride                  101 mEq/L                   96-112   Carbon Dioxide            30 mEq/L                    19-32   Glucose                   87 mg/dL                    54-09   BUN                  [H]  25 mg/dL                    8-11   Creatinine                1.0 mg/dL                   9.1-4.7   Calcium                   9.4 mg/dL                   8.2-95.6   GFR                       60.60 mL/min                >60  Orders Added: 1)  TLB-CBC Platelet - w/Differential [85025-CBCD] 2)  TLB-TSH (Thyroid Stimulating Hormone) [84443-TSH] 3)  TLB-Lipid Panel [80061-LIPID] 4)  TLB-Hepatic/Liver Function Pnl [80076-HEPATIC] 5)  TLB-BMP (Basic Metabolic Panel-BMET) [80048-METABOL] 6)  Medicare -1st Annual Wellness Visit [G0438]

## 2010-02-02 NOTE — Progress Notes (Signed)
Summary: Nose bleed  Phone Note Call from Patient   Summary of Call: Pt c/o "bad" nose bleed. Took 15 minutes for it to stop. She has never had nose bleed as an adult  and would like a call back.  Initial call taken by: Lamar Sprinkles, CMA,  July 21, 2009 1:30 PM  Follow-up for Phone Call        Spoke w/pt No c/o pain, h/a's, sinus issues. No med changes. Overall feels like her normal self. She was very concerned about this nose bleed b/c she has never had one and would like MD to advise.   Follow-up by: Lamar Sprinkles, CMA,  July 21, 2009 2:05 PM  Additional Follow-up for Phone Call Additional follow up Details #1::        ON no medications that would contribute to epistaxis.  called pt. - no answer. If she has recurrence may need ENT to exam and look for superficial vessels that may be the culprit.  Additional Follow-up by: Jacques Navy MD,  July 21, 2009 5:31 PM    Additional Follow-up for Phone Call Additional follow up Details #2::    Pt informed  Follow-up by: Lamar Sprinkles, CMA,  July 22, 2009 11:33 AM

## 2010-02-02 NOTE — Assessment & Plan Note (Signed)
Summary: POST HOSP/NWS   Vital Signs:  Patient profile:   77 year old female Height:      67 inches Weight:      221 pounds BMI:     34.74 O2 Sat:      99 % on Room air Temp:     97.6 degrees F oral Pulse rate:   86 / minute BP sitting:   128 / 62  (left arm) Cuff size:   large  Vitals Entered By: Bill Salinas CMA (January 29, 2009 3:17 PM)  O2 Flow:  Room air CC: hosp fu/ ab   Primary Care Provider:  Jacques Navy MD  CC:  hosp fu/ ab.  History of Present Illness: Hospitalized for right UE cellulits - she recieved a full 7 days of Vancomycin and then was switched to doxycyline. she had a furuncle over the right biceps with pointing. Since d/c/ she has continued the doxycyline and application of heat to the arm. she is much better but still has some residual redness and tenderness to the arm.  she needs to discuss meds: state health plan is changing. She has been told to switch to prilosec otc.Will try H2 blocker therapy.   Current Medications (verified): 1)  Hydrochlorothiazide 25 Mg Tabs (Hydrochlorothiazide) .... Take Once Daily 2)  Simvastatin 20 Mg Tabs (Simvastatin) .Marland Kitchen.. 1 Q Pm 3)  Synthroid 50 Mcg Tabs (Levothyroxine Sodium) .... Take Once Daily 4)  Omeprazole 20 Mg Cpdr (Omeprazole) .... Take 2 Tabs Daily 5)  Amitriptyline Hcl 25 Mg  Tabs (Amitriptyline Hcl) .... Take 1-3 At Bedtime 6)  Citalopram Hydrobromide 20 Mg  Tabs (Citalopram Hydrobromide) .... Take One Tablet Once Daily 7)  Fish Oil   Oil (Fish Oil) .... Take Once Daily 8)  Multivitamins   Tabs (Multiple Vitamin) .... Take Once Daily  Allergies (verified): 1)  ! Iodine 2)  * Nexium 3)  * Shellfish  Past History:  Past Medical History: Last updated: 04/22/2008 CELIAC DISEASE (ICD-579.0) Hx of DIVERTICULOSIS, COLON (ICD-562.10) BREAST BIOPSY, HX OF X2 (ICD-V15.9) PERIPHERAL NEUROPATHY (ICD-356.9) TONSILLECTOMY, UNDER AGE 90, HX OF (ICD-V15.89) MASTECTOMY, MODIFIED RADICAL, HX OF RIGHT  (ICD-V45.71) Hx of ADENOCARCINOMA, BREAST, RIGHT (ICD-174.9) GASTROINTESTINAL HEMORRHAGE, HX OF (ICD-V12.79) HYPOTHYROIDISM (ICD-244.9) HYPERLIPIDEMIA (ICD-272.4) KNEE REPLACEMENT, LEFT, HX OF (ICD-V43.65) DEGENERATIVE JOINT DISEASE (ICD-715.90) DEPRESSION (ICD-311) ATTENTION DEFICIT DISORDER (ICD-314.00) ROSACEA (ICD-695.3) Hx of COUGH, CHRONIC (ICD-786.2) GASTROESOPHAGEAL REFLUX DISEASE (ICD-530.81) HYPERTENSION (ICD-401.9) Esophageal stricture 2001 Gastric ulcer with bleeding 2001  Past Surgical History: Last updated: 03/13/2007 BREAST BIOPSY, HX OF X2 (ICD-V15.9) TONSILLECTOMY, UNDER AGE 90, HX OF (ICD-V15.89) MASTECTOMY, MODIFIED RADICAL, HX OF RIGHT (ICD-V45.71) KNEE REPLACEMENT, LEFT, HX OF (ICD-V43.65) PSH reviewed for relevance, FH reviewed for relevance  Review of Systems  The patient denies anorexia, fever, weight loss, weight gain, vision loss, dyspnea on exertion, peripheral edema, headaches, hemoptysis, abdominal pain, severe indigestion/heartburn, incontinence, muscle weakness, difficulty walking, depression, and abnormal bleeding.    Physical Exam  General:  alert, well-developed, and well-nourished.   Head:  normocephalic, atraumatic, and no abnormalities observed.   Eyes:  pupils equal and pupils round.   Skin:  area over the right biceps with erythema and there is a small nodule Subcutaneously, but no pointing lesion. The posterior arm is red and hot.   Impression & Recommendations:  Problem # 1:  CELLULITIS, ARM (ICD-682.3)  Much better but with residual redness.  Plan - extend doxycyline 2100mg  two times a day for additional 7 days.   Her updated medication list  for this problem includes:    Doxycycline Hyclate 100 Mg Caps (Doxycycline hyclate) .Marland Kitchen... 1 by mouth two times a day x 7 day  Problem # 2:  PUD (ICD-533.90) Patient is stable doing well. She has reduced EtOH intake and red meat intake.  Plan switch from PPI to H2 blocker therapy.  Her  updated medication list for this problem includes:    Ranitidine Hcl 150 Mg Caps (Ranitidine hcl) .Marland Kitchen... 1 by mouth two times a day  Complete Medication List: 1)  Hydrochlorothiazide 25 Mg Tabs (Hydrochlorothiazide) .... Take once daily 2)  Simvastatin 20 Mg Tabs (Simvastatin) .Marland Kitchen.. 1 q pm 3)  Synthroid 50 Mcg Tabs (Levothyroxine sodium) .... Take once daily 4)  Ranitidine Hcl 150 Mg Caps (Ranitidine hcl) .Marland Kitchen.. 1 by mouth two times a day 5)  Amitriptyline Hcl 25 Mg Tabs (Amitriptyline hcl) .... Take 1-3 at bedtime 6)  Citalopram Hydrobromide 20 Mg Tabs (Citalopram hydrobromide) .... Take one tablet once daily 7)  Fish Oil Oil (Fish oil) .... Take once daily 8)  Multivitamins Tabs (Multiple vitamin) .... Take once daily 9)  Doxycycline Hyclate 100 Mg Caps (Doxycycline hyclate) .Marland Kitchen.. 1 by mouth two times a day x 7 day Prescriptions: RANITIDINE HCL 150 MG CAPS (RANITIDINE HCL) 1 by mouth two times a day  #60 x 12   Entered and Authorized by:   Jacques Navy MD   Signed by:   Jacques Navy MD on 01/29/2009   Method used:   Electronically to        [facility] [address] [phone]* (retail)       [address]       [place], [place]  [address]       Ph: [phone] or [phone]       Fax: [phone]   RxID:   [id number] DOXYCYCLINE HYCLATE 100 MG CAPS (DOXYCYCLINE HYCLATE) 1 by mouth two times a day x 7 day  #14 x 0   Entered and Authorized by:   Jacques Navy MD   Signed by:   Jacques Navy MD on 01/29/2009   Method used:   Electronically to        [facility] [address] [phone]* (retail)       [address]       [place], [place]  [address]       Ph: [phone] or [phone]       Fax: [phone]   RxID:   [id number]

## 2010-02-02 NOTE — Progress Notes (Signed)
Summary: REQ FOR RX  Phone Note Call from Patient Call back at Home Phone [phone]   Summary of Call: Patient is requesting refill of omeprazole to go [facility]. This is not on med list but pt states she is taking and pharm has rx history. OK to fill?  Initial call taken by: Lamar Sprinkles, CMA,  November 11, 2009 12:29 PM  Follow-up for Phone Call        K omeprazole 40mg  # 30 1 q AM, refill as needed. Add to med list Follow-up by: Jacques Navy MD,  November 11, 2009 10:50 PM  Additional Follow-up for Phone Call Additional follow up Details #1::        No answer, no vm..............................Marland KitchenLamar Sprinkles, CMA  November 12, 2009 11:18 AM     Additional Follow-up for Phone Call Additional follow up Details #2::    lmoam for pt Follow-up by: Ami Bullins CMA,  November 12, 2009 11:53 AM  Additional Follow-up for Phone Call Additional follow up Details #3:: Details for Additional Follow-up Action Taken: Pt informed  Additional Follow-up by: Lamar Sprinkles, CMA,  November 12, 2009 3:47 PM  New/Updated Medications: OMEPRAZOLE 40 MG CPDR (OMEPRAZOLE) 1 every am Prescriptions: OMEPRAZOLE 40 MG CPDR (OMEPRAZOLE) 1 every am  #90 x 3   Entered by:   Lamar Sprinkles, CMA   Authorized by:   Jacques Navy MD   Signed by:   Lamar Sprinkles, CMA on 11/12/2009   Method used:   [facility]ically [facility]        [facility] [address] [phone]* (retail)       [address]       [place], [place]  [address]       Ph: [phone] or [phone]       Fax: [phone]   RxID:   [id number]

## 2010-02-02 NOTE — Assessment & Plan Note (Signed)
Summary: FLU AND PNEUMONIA VAC  MEN  STC  Nurse Visit   Allergies: 1)  ! Iodine 2)  * Nexium 3)  * Shellfish  Immunizations Administered:  Pneumonia Vaccine:    Vaccine Type: Pneumovax    Site: left deltoid    Mfr: Merck    Dose: 0.5 ml    Route: IM    Given by: Alysia Penna    Exp. Date: 03/18/2011    Lot #: [id number]    VIS given: 12/08/08 version given September 24, 2009.  Orders Added: 1)  Flu Vaccine 65yrs + MEDICARE PATIENTS [Q2039] 2)  Administration Flu vaccine - MCR [G0008] 3)  Pneumococcal Vaccine [90732] 4)  Admin 1st Vaccine [56387]      Flu Vaccine Consent Questions     Do you have a history of severe allergic reactions to this vaccine? no    Any prior history of allergic reactions to egg and/or gelatin? no    Do you have a sensitivity to the preservative Thimersol? no    Do you have a past history of Guillan-Barre Syndrome? no    Do you currently have an acute febrile illness? no    Have you ever had a severe reaction to latex? no    Vaccine information given and explained to patient? yes    Are you currently pregnant? no    Lot Number:[id number]   Exp Date:07/03/2010   Site Given  Left Deltoid IMu

## 2010-02-02 NOTE — Initial Assessments (Signed)
Summary: cellulitis / SD   Vital Signs:  Patient profile:   77 year old female Height:      67 inches Weight:      223 pounds BMI:     35.05 O2 Sat:      97 % on Room air Temp:     100.7 degrees F oral Pulse rate:   94 / minute BP sitting:   108 / 60  (left arm) Cuff size:   large  Vitals Entered By: Bill Salinas CMA (January 16, 2009 10:55 AM)  O2 Flow:  Room air CC: office visit for evaluation of cellulitis/ ab   Primary Care Provider:  Jacques Navy MD  CC:  office visit for evaluation of cellulitis/ ab.  History of Present Illness: Patient fell yesterday striking her right arm. This AM she awoke with terrible pain in the right arm and marked erythema. She went to Urgent Care where she had x-rays with no difinitive evidence of a fracture although there is a question about and area of the distal radius. Her arm is red and hot and painful. Her temperature is going up. Her BP is down. She is now admitted with acute cellulitis.   Current Medications (verified): 1)  Hydrochlorothiazide 25 Mg Tabs (Hydrochlorothiazide) .... Take Once Daily 2)  Simvastatin 20 Mg Tabs (Simvastatin) .Marland Kitchen.. 1 Q Pm 3)  Synthroid 50 Mcg Tabs (Levothyroxine Sodium) .... Take Once Daily 4)  Omeprazole 20 Mg Cpdr (Omeprazole) .... Take 2 Tabs Daily 5)  Amitriptyline Hcl 25 Mg  Tabs (Amitriptyline Hcl) .... Take 1-3 At Bedtime 6)  Citalopram Hydrobromide 20 Mg  Tabs (Citalopram Hydrobromide) .... Take One Tablet Once Daily 7)  Fish Oil   Oil (Fish Oil) .... Take Once Daily 8)  Multivitamins   Tabs (Multiple Vitamin) .... Take Once Daily  Allergies (verified): 1)  ! Iodine 2)  * Nexium 3)  * Shellfish  Past History:  Past Medical History: Last updated: 04/22/2008 CELIAC DISEASE (ICD-579.0) Hx of DIVERTICULOSIS, COLON (ICD-562.10) BREAST BIOPSY, HX OF X2 (ICD-V15.9) PERIPHERAL NEUROPATHY (ICD-356.9) TONSILLECTOMY, UNDER AGE 41, HX OF (ICD-V15.89) MASTECTOMY, MODIFIED RADICAL, HX OF RIGHT  (ICD-V45.71) Hx of ADENOCARCINOMA, BREAST, RIGHT (ICD-174.9) GASTROINTESTINAL HEMORRHAGE, HX OF (ICD-V12.79) HYPOTHYROIDISM (ICD-244.9) HYPERLIPIDEMIA (ICD-272.4) KNEE REPLACEMENT, LEFT, HX OF (ICD-V43.65) DEGENERATIVE JOINT DISEASE (ICD-715.90) DEPRESSION (ICD-311) ATTENTION DEFICIT DISORDER (ICD-314.00) ROSACEA (ICD-695.3) Hx of COUGH, CHRONIC (ICD-786.2) GASTROESOPHAGEAL REFLUX DISEASE (ICD-530.81) HYPERTENSION (ICD-401.9) Esophageal stricture 2001 Gastric ulcer with bleeding 2001  Past Surgical History: Last updated: 03/13/2007 BREAST BIOPSY, HX OF X2 (ICD-V15.9) TONSILLECTOMY, UNDER AGE 41, HX OF (ICD-V15.89) MASTECTOMY, MODIFIED RADICAL, HX OF RIGHT (ICD-V45.71) KNEE REPLACEMENT, LEFT, HX OF (ICD-V43.65)  Family History: Last updated: 04/17/2008 Lung cancer: father Family History of Heart Disease: Mother  Social History: Last updated: 04/22/2008 Married Patient has never smoked.  Alcohol Use - no Daily Caffeine Use 1 per day Illicit Drug Use - no Patient does not get regular exercise.   Risk Factors: Exercise: no (04/22/2008)  Risk Factors: Smoking Status: never (04/22/2008)  Review of Systems  The patient denies anorexia, fever, weight loss, weight gain, decreased hearing, hoarseness, syncope, dyspnea on exertion, peripheral edema, headaches, abdominal pain, incontinence, muscle weakness, difficulty walking, depression, enlarged lymph nodes, and angioedema.    Physical Exam  General:  Heavy set white female in no distress Head:  Normocephalic and atraumatic without obvious abnormalities. No apparent alopecia or balding. Eyes:  No corneal or conjunctival inflammation noted. EOMI. Perrla. Funduscopic exam benign, without hemorrhages, exudates or papilledema.  Vision grossly normal. Ears:  External ear exam shows no significant lesions or deformities.  Otoscopic examination reveals clear canals, tympanic membranes are intact bilaterally without bulging,  retraction, inflammation or discharge. Hearing is grossly normal bilaterally. Nose:  no external deformity and no external erythema.   Mouth:  Oral mucosa and oropharynx without lesions or exudates.  Teeth in good repair. Neck:  supple, full ROM, and no thyromegaly.   Chest Wall:  no deformities.   Lungs:  Normal respiratory effort, chest expands symmetrically. Lungs are clear to auscultation, no crackles or wheezes. Heart:  Normal rate and regular rhythm. S1 and S2 normal without gallop, murmur, click, rub or other extra sounds. Abdomen:  soft, non-tender, normal bowel sounds, no distention, no rigidity, and no hepatomegaly.   Genitalia:  deferred Msk:  normal ROM, no joint tenderness, no joint swelling, no joint warmth, no redness over joints, and no joint instability.  Knees enlarged but no tender Pulses:  2+ radial Extremities:  1+ distal LE edema Neurologic:  alert & oriented X3, cranial nerves II-XII intact, strength normal in all extremities, sensation intact to light touch, gait normal, and DTRs symmetrical and normal.   Skin:  right arm is rred from mid-forearm to 7 cm above the elbow, hot and tender to touch. Cervical Nodes:  supra-clavicular node right Axillary Nodes:  tender but no descrete nodes Inguinal Nodes:  none Psych:  Cognition and judgment appear intact. Alert and cooperative with normal attention span and concentration. No apparent delusions, illusions, hallucinations   Impression & Recommendations:  Problem # 1:  CELLULITIS, ARM (ICD-682.3)  Acute rapidly progressive cellulitis of right arm with fever and drop in BP  Plan - regular admit          routine lab - CBCD, Blood cultures from two sites.           Vancomycin -pharmacy to dose, Cefazolin 2g IV q 8            Orders: No Charge Patient Arrived (NCPA0) (NCPA0)  Problem # 2:  ANEMIA, IRON DEFICIENCY (ICD-280.9) Stable\  Plan - CBC a admission  The following medications were removed from the medication  list:    Ferrous Sulfate 324 Mg Tbec (Ferrous sulfate) .Marland Kitchen... 1 tab by mouth two times a day  Problem # 3:  HYPERTENSION (ICD-401.9) Will continue home meds  Her updated medication list for this problem includes:    Hydrochlorothiazide 25 Mg Tabs (Hydrochlorothiazide) .Marland Kitchen... Take once daily  Complete Medication List: 1)  Hydrochlorothiazide 25 Mg Tabs (Hydrochlorothiazide) .... Take once daily 2)  Simvastatin 20 Mg Tabs (Simvastatin) .Marland Kitchen.. 1 q pm 3)  Synthroid 50 Mcg Tabs (Levothyroxine sodium) .... Take once daily 4)  Omeprazole 20 Mg Cpdr (Omeprazole) .... Take 2 tabs daily 5)  Amitriptyline Hcl 25 Mg Tabs (Amitriptyline hcl) .... Take 1-3 at bedtime 6)  Citalopram Hydrobromide 20 Mg Tabs (Citalopram hydrobromide) .... Take one tablet once daily 7)  Fish Oil Oil (Fish oil) .... Take once daily 8)  Multivitamins Tabs (Multiple vitamin) .... Take once daily

## 2010-02-18 ENCOUNTER — Telehealth (INDEPENDENT_AMBULATORY_CARE_PROVIDER_SITE_OTHER): Payer: Self-pay | Admitting: *Deleted

## 2010-02-22 ENCOUNTER — Encounter: Payer: Self-pay | Admitting: Internal Medicine

## 2010-02-24 ENCOUNTER — Encounter: Payer: Self-pay | Admitting: Internal Medicine

## 2010-02-24 ENCOUNTER — Ambulatory Visit (INDEPENDENT_AMBULATORY_CARE_PROVIDER_SITE_OTHER): Payer: Medicare Other | Admitting: Internal Medicine

## 2010-02-24 DIAGNOSIS — R05 Cough: Secondary | ICD-10-CM

## 2010-03-02 NOTE — Progress Notes (Signed)
Summary: PA-Omeprazole  Phone Note From Pharmacy   Summary of Call: PA-Omeprazole called [facility] @ [phone], awaiting form. Daisy Bryant  February 18, 2010 4:59 PM Form to Dr Debby Bud to complete. Daisy Bryant  February 19, 2010 12:41 PM Faxed to [facility] @ [phone], awaiting approval. Daisy Bryant  February 22, 2010 3:10 PM PA Approved 02/01/10 - 02/22/11, pt aware. Initial call taken by: Daisy Bryant,  February 23, 2010 3:08 PM

## 2010-03-02 NOTE — Assessment & Plan Note (Signed)
Summary: URI / NWS  #   Vital Signs:  Patient profile:   77 year old female Height:      67 inches Weight:      220 pounds BMI:     34.58 O2 Sat:      97 % on Room air Temp:     98.8 degrees F oral Pulse rate:   75 / minute BP sitting:   120 / 72  (left arm) Cuff size:   large  Vitals Entered By: Bill Salinas CMA (February 24, 2010 11:23 AM)  O2 Flow:  Room air CC: pt here with c/o cough with sore throat. Pt was seen at [facility] and given Zpak and tesslon perles. Pt states meds helped but still has slight cough with sore throat and fatige/ ab   Primary Care Provider:  Jacques Navy MD  CC:  pt here with c/o cough with sore throat. Pt was seen at [facility] and given Zpak and tesslon perles. Pt states meds helped but still has slight cough with sore throat and fatige/ ab.  History of Present Illness: Daisy Bryant presents with a 3 week h/o sore throat and sinus draining. she had terrible coughing  that is controlled by benzonatate perle. She did got to [facility] about 10 days  ago and was started on the Z-pak and the tessalon perles. No fever since finishing the Z-pak. The persistent symptoms are the coughing and the sore throat and malaise. No hard chills. MIld SOB/DOE, no wheezing. No N/V/D.  GI- very few symptoms since stopping the bread - gluten intolerance.   At Urgent Care she was told that she had a heart murmur.   Current Medications (verified): 1)  Hydrochlorothiazide 25 Mg Tabs (Hydrochlorothiazide) .... Take Once Daily 2)  Simvastatin 20 Mg Tabs (Simvastatin) .Marland Kitchen.. 1 Q Pm 3)  Synthroid 50 Mcg Tabs (Levothyroxine Sodium) .... Take Once Daily 4)  Amitriptyline Hcl 25 Mg  Tabs (Amitriptyline Hcl) .... Take 1-3 At Bedtime 5)  Citalopram Hydrobromide 20 Mg  Tabs (Citalopram Hydrobromide) .... Take One Tablet Once Daily 6)  Fish Oil   Oil (Fish Oil) .... Take Once Daily 7)  Multivitamins   Tabs (Multiple Vitamin) .... Take Once Daily 8)  Omeprazole 40 Mg Cpdr (Omeprazole) .Marland Kitchen.. 1  Every Am  Allergies (verified): 1)  ! Iodine 2)  * Nexium 3)  * Shellfish  Past History:  Past Medical History: Last updated: 04/22/2008 CELIAC DISEASE (ICD-579.0) Hx of DIVERTICULOSIS, COLON (ICD-562.10) BREAST BIOPSY, HX OF X2 (ICD-V15.9) PERIPHERAL NEUROPATHY (ICD-356.9) TONSILLECTOMY, UNDER AGE 55, HX OF (ICD-V15.89) MASTECTOMY, MODIFIED RADICAL, HX OF RIGHT (ICD-V45.71) Hx of ADENOCARCINOMA, BREAST, RIGHT (ICD-174.9) GASTROINTESTINAL HEMORRHAGE, HX OF (ICD-V12.79) HYPOTHYROIDISM (ICD-244.9) HYPERLIPIDEMIA (ICD-272.4) KNEE REPLACEMENT, LEFT, HX OF (ICD-V43.65) DEGENERATIVE JOINT DISEASE (ICD-715.90) DEPRESSION (ICD-311) ATTENTION DEFICIT DISORDER (ICD-314.00) ROSACEA (ICD-695.3) Hx of COUGH, CHRONIC (ICD-786.2) GASTROESOPHAGEAL REFLUX DISEASE (ICD-530.81) HYPERTENSION (ICD-401.9) Esophageal stricture 2001 Gastric ulcer with bleeding 2001  Past Surgical History: Last updated: 03/13/2007 BREAST BIOPSY, HX OF X2 (ICD-V15.9) TONSILLECTOMY, UNDER AGE 55, HX OF (ICD-V15.89) MASTECTOMY, MODIFIED RADICAL, HX OF RIGHT (ICD-V45.71) KNEE REPLACEMENT, LEFT, HX OF (ICD-V43.65)  Family History: Last updated: 04/17/2008 Lung cancer: father Family History of Heart Disease: Mother  Social History: Last updated: 04/22/2008 Married Patient has never smoked.  Alcohol Use - no Daily Caffeine Use 1 per day Illicit Drug Use - no Patient does not get regular exercise.   Review of Systems       The patient complains of anorexia,  hoarseness, and dyspnea on exertion.  The patient denies fever, vision loss, decreased hearing, chest pain, peripheral edema, abdominal pain, incontinence, suspicious skin lesions, enlarged lymph nodes, and angioedema.    Physical Exam  General:  alert, well-developed, and well-nourished.   Head:  normocephalic, atraumatic, and no abnormalities observed.  No sinus tenderness to percussion Eyes:  C&S clear Mouth:  throat is clear Neck:  supple and no  masses.   Lungs:  normal respiratory effort, no intercostal retractions, no accessory muscle use, normal breath sounds, no crackles, and no wheezes.   Heart:  normal rate and regular rhythm.     Impression & Recommendations:  Problem # 1:  COUGH (ICD-786.2) cyclical cough  Plan - continue tessalon perles           phen/co           prednisone 20x 3, 10 x 6  Complete Medication List: 1)  Hydrochlorothiazide 25 Mg Tabs (Hydrochlorothiazide) .... Take once daily 2)  Simvastatin 20 Mg Tabs (Simvastatin) .Marland Kitchen.. 1 q pm 3)  Synthroid 50 Mcg Tabs (Levothyroxine sodium) .... Take once daily 4)  Amitriptyline Hcl 25 Mg Tabs (Amitriptyline hcl) .... Take 1-3 at bedtime 5)  Citalopram Hydrobromide 20 Mg Tabs (Citalopram hydrobromide) .... Take one tablet once daily 6)  Fish Oil Oil (Fish oil) .... Take once daily 7)  Multivitamins Tabs (Multiple vitamin) .... Take once daily 8)  Omeprazole 40 Mg Cpdr (Omeprazole) .Marland Kitchen.. 1 every am 9)  Benzonatate 100 Mg Caps (Benzonatate) .Marland Kitchen.. 1 by mouth three times a day 10)  Promethazine-codeine 6.25-10 Mg/33ml Syrp (Promethazine-codeine) .Marland Kitchen.. 1 tsp q 6 for cough 11)  Prednisone 10 Mg Tabs (Prednisone) .... 2 tabs q d x 3 days, 1 tab once daily x 6 days  Patient Instructions: 1)  cough - no sign of active bacterial infection.Suspect a "cyclical" cough. Plan - continue tessalon perles; add phenergan with codeine cough syrup 1 tsp every 6 hrs; prednisone 10mg   2 tabs once daily x 3, 1 tab once daily x 6. 2)  Heart Murmur - I don't hear a murmur. Prescriptions: PREDNISONE 10 MG TABS (PREDNISONE) 2 tabs q d x 3 days, 1 tab once daily x 6 days  #12 x 0   Entered and Authorized by:   Jacques Navy MD   Signed by:   Jacques Navy MD on 02/24/2010   Method used:   Handwritten   RxID:   [id number] PROMETHAZINE-CODEINE 6.25-10 MG/5ML SYRP (PROMETHAZINE-CODEINE) 1 tsp q 6 for cough  #8 oz x 1   Entered and Authorized by:   Jacques Navy MD   Signed by:    Jacques Navy MD on 02/24/2010   Method used:   Handwritten   RxID:   [id number]    Orders Added: 1)  Est. Patient Level III [84132]

## 2010-03-02 NOTE — Medication Information (Signed)
Summary: Prior Authorization & Approved Omeprazole / [facility]  Prior Authorization & Approved Omeprazole / [facility]   Imported By: Lennie Odor 02/24/2010 12:22:44  _____________________________________________________________________  External Attachment:    Type:   Image     Comment:   External Document

## 2010-03-20 LAB — CBC
HCT: 31.9 % — ABNORMAL LOW (ref 36.0–46.0)
Hemoglobin: 10.9 g/dL — ABNORMAL LOW (ref 12.0–15.0)
MCHC: 34.1 g/dL (ref 30.0–36.0)
MCV: 84.5 fL (ref 78.0–100.0)
Platelets: 251 10*3/uL (ref 150–400)
RBC: 3.77 MIL/uL — ABNORMAL LOW (ref 3.87–5.11)
RDW: 15.6 % — ABNORMAL HIGH (ref 11.5–15.5)
WBC: 17.2 10*3/uL — ABNORMAL HIGH (ref 4.0–10.5)

## 2010-03-20 LAB — DIFFERENTIAL
Basophils Absolute: 0 10*3/uL (ref 0.0–0.1)
Basophils Relative: 0 % (ref 0–1)
Eosinophils Absolute: 0 10*3/uL (ref 0.0–0.7)
Eosinophils Relative: 0 % (ref 0–5)
Lymphocytes Relative: 3 % — ABNORMAL LOW (ref 12–46)
Lymphs Abs: 0.5 10*3/uL — ABNORMAL LOW (ref 0.7–4.0)
Monocytes Absolute: 0.8 10*3/uL (ref 0.1–1.0)
Monocytes Relative: 5 % (ref 3–12)
Neutro Abs: 15.9 10*3/uL — ABNORMAL HIGH (ref 1.7–7.7)
Neutrophils Relative %: 92 % — ABNORMAL HIGH (ref 43–77)

## 2010-03-20 LAB — CULTURE, BLOOD (ROUTINE X 2)
Culture: NO GROWTH
Culture: NO GROWTH

## 2010-03-20 LAB — BASIC METABOLIC PANEL
BUN: 20 mg/dL (ref 6–23)
CO2: 24 mEq/L (ref 19–32)
Calcium: 8.6 mg/dL (ref 8.4–10.5)
Chloride: 98 mEq/L (ref 96–112)
Creatinine, Ser: 1.1 mg/dL (ref 0.4–1.2)
GFR calc Af Amer: 58 mL/min — ABNORMAL LOW (ref >60)
GFR calc non Af Amer: 48 mL/min — ABNORMAL LOW (ref >60)
Glucose, Bld: 121 mg/dL — ABNORMAL HIGH (ref 70–99)
Potassium: 4 mEq/L (ref 3.5–5.1)
Sodium: 134 mEq/L — ABNORMAL LOW (ref 135–145)

## 2010-03-21 LAB — BASIC METABOLIC PANEL
CO2: 31 mEq/L (ref 19–32)
Calcium: 8.6 mg/dL (ref 8.4–10.5)
Chloride: 100 mEq/L (ref 96–112)
Creatinine, Ser: 1.03 mg/dL (ref 0.4–1.2)
GFR calc Af Amer: >60 mL/min (ref >60)
Sodium: 137 mEq/L (ref 135–145)

## 2010-03-21 LAB — CBC
HCT: 27.7 % — ABNORMAL LOW (ref 36.0–46.0)
Hemoglobin: 9.4 g/dL — ABNORMAL LOW (ref 12.0–15.0)
MCHC: 33.8 g/dL (ref 30.0–36.0)
MCV: 85.1 fL (ref 78.0–100.0)
Platelets: 207 10*3/uL (ref 150–400)
RBC: 3.26 MIL/uL — ABNORMAL LOW (ref 3.87–5.11)
RDW: 15.7 % — ABNORMAL HIGH (ref 11.5–15.5)
WBC: 8.7 10*3/uL (ref 4.0–10.5)

## 2010-03-21 LAB — DIFFERENTIAL
Basophils Absolute: 0 10*3/uL (ref 0.0–0.1)
Eosinophils Relative: 4 % (ref 0–5)
Lymphocytes Relative: 8 % — ABNORMAL LOW (ref 12–46)
Lymphs Abs: 0.7 10*3/uL (ref 0.7–4.0)
Neutrophils Relative %: 79 % — ABNORMAL HIGH (ref 43–77)

## 2010-05-18 NOTE — Letter (Signed)
May 30, 2006    Windy Fast L. Earlene Plater, M.D.  [address]  [place]  [place] [address]   RE:  Daisy Bryant, Daisy Bryant  MRN:  [id number]  /  DOB:  23-Aug-1930   Dear Ron:   Thank you very much for seeing Theadore Nan, a pleasant patient in my  practice, who is 77 years old and is having increasing problems stress  incontinence.  She is at a point where every morning when she wakes up  she has stress incontinence before being able to reach the bathroom.  She does report that previously she had been told by a gynecologist that  because a difficult labor, she was going to need her plumbing reworked  at some point.   Please find enclosed my dictation from September 16, 2004 which does  detail the patient's past medical history, family history and social  history.   An update on her social history is that her husband Lavada Langsam, a former  patient of yours, did pass away to sepsis one year ago.   If I could provide any additional information that will assist you in  the care of this nice patient, please do not hesitate to contact me.    Sincerely,      Rosalyn Gess. Norins, MD  Electronically Signed    MEN/Med[facility]  DD: 05/30/2006  DT: 05/30/2006  Job #: [id number]

## 2010-05-21 NOTE — H&P (Signed)
[facility]  Patient:    Daisy Bryant, Daisy Bryant                        MRN: [id number] Adm. Date:  [id number] Attending:  Evette Georges CC:         Rosalyn Gess. Norins, M.D. [facility]   History and Physical  PRIMARY CARE PHYSICIAN:  Rosalyn Gess. Norins, M.D.  ADMITTING PHYSICIAN:  Evette Georges, M.D. [facility]  TIME OF ADMISSION:  6:00 p.m.  HISTORY OF PRESENT ILLNESS:  This is one of many [facility] admissions for this 77 year old married white female, gravida 4, para 4, AB 0.  She comes in today as an acute ERE valve because of a GI bleed.  The patient states she felt well until two days ago, when she developed dark, tarry stools.  She had no fever, chills, nausea or vomiting.  Friday, the day prior to admission, she also had two black stools.  The day of admission she had one black stool and felt very weak.  Her husband called and she was advised by me to come to the emergency room stat.  In the emergency room she was found to have a BP of 60 systolic, and was extremely pale.  She was immediately placed in a stretcher.  IV fluids were started.  She was placed on a monitor.  Stat hemoglobin was 6.0.  She was subsequently triaged and admitted to the ICU for treatment of GI bleed.  The patient has never had a history of previous GI bleeds in the past.  At one point and time many years ago she was told by Dr. Loraine Leriche she might have an ulcer and was given Pepcid.  No diagnostic studies were done at the time.  She had a lower flexible sigmoidoscopy by her primary care physician, Dr. Illene Regulus, within the last two years -- that was normal.  PAST MEDICAL HISTORY: 1. Childbirth x 4. 2. Breast cancer on the right 10 years ago.  Had a modified radical    mastectomy.  Since that time she has had chronic lymphedema of her    arm.  She had subsequent chemotherapy and has done well.  She had a    biopsy of the left breast after that which was  negative.  PAST ILLNESSES:  None.  PAST INJURIES:  None.  ALLERGIES:  SHELL FISH (i.e., shrimp, mussels and scallops.  States gives her hives).  CURRENT MEDICATIONS: 1. Celexa 20 mg q.d. 2. Elavil 10 mg q.h.s. for depression, secondary to death of her mother. 3. Synthroid 50 mcg q.d. 4. Aleve two tablets b.i.d. (which she has been on for many years because    of OA). 5. Glucosamine one b.i.d.  PAST SURGERIES:  Outpatient surgery to her knee.  HABITS:  She does not smoke or drink any alcohol.  REVIEW OF SYSTEMS:  GENERAL:  Her weight has been steady.  HEENT:  She does wear glasses for distance, and gets routine eye checks for glaucoma.  She has decreased hearing, low range secondary to chemotherapy.  She gets regular dental care.  PULMONARY:  She has had no history of any pulmonary disease, except she was told as a child she had TB.  This was treated by bed rest for many months at home.  She was not given any medication.  She says her subsequent chest x-rays show a lot of calcium.  CARDIOVASCULAR:  She has  never had any history of heart disease.  GASTROINTESTINAL:  She has had no history of GI problems in the past, except for noted above.  GENITOURINARY:  She has had no history of GU problems.  OB/GYN:  She is gravida 4, para 4, AB 0.  Her LMP was 17 years ago.  She has never been on HRT.  ENDOCRINE:  Negative, except for the hypothyroidism (which was corrected with Synthroid).  FAMILY HISTORY:  Mother died at 37 of sepsis; she had a fractured hip and had either COPD or CHF (patient is not sure which).  Her father died of lung cancer (was a smoker).  One brother died of suicide.  No sisters.  PREVENTIVE TREATMENTS:  She is up on all her health maintenance activities. She gets routine mammography.  As noted above, Dr. Debby Bud did a flexible sigmoidoscopy in 2000 which was negative.  She is also up on her vaccinations.  SOCIAL HISTORY:  She is married.  [place]  She is a retired Runner, broadcasting/film/video, originally from [place], [place].  She has four children, one of whom lives in [place].  She lives here with her husband.  PHYSICAL EXAMINATION:  VITAL SIGNS:  Temperature 98, pulse 80 and regular, respirations 12 and regular, blood pressure initially 60/40 (now 100/70).  GENERAL:  She has IV fluids running.  Foley catheter was inserted.  She was placed head down.  In general she is a well-developed, pale female; in no acute distress.  HEENT:  Negative.  NECK:  Supple.  The thyroid is not enlarged.  No carotid bruits.  CHEST:  Clear to auscultation.  CARDIAC:  Negative.  BREASTS:  Left breast normal, except for a scar in the 12 oclock position from previous biopsy.  The right breast has been surgically removed.  She had a radical mastectomy which included the removal of lymph nodes in her arm. She has chronic lymphedema and wears a stocking around her arm.  ABDOMINAL:  Negative.  PELVIC:  Deferred.  RECTAL:  Normal. Stool guaiac-positive and black.  EXTREMITIES:  Normal skin.  Peripheral pulses normal, except she does have 2+ edema (which she says is chronic).  LABORATORY DATA:  Prothrombin time, PTT and platelet count all within normal limits.  Amylase and lipase were normal.  Metabolic panel normal, except for blood sugar at 151, with a BUN of 49 and creatinine normal at 0.7.  Hemoglobin 6.0.  IMPRESSION: 1. GI bleed.  Probably upper secondary to NSAID. 2. History of right breast cancer.  With subsequent modified radical    mastectomy and lymph node removal, with subsequent lymphedema in    that arm.  She also had chemotherapy and radiation. 3. Status post childbirth x 4. 4. Status post biopsy of left breast. 5. History of shellfish allergy.  PLAN:  Admit to ICU.  IV fluids.  Type and cross three units of packed red blood cells, give three units over the next 18 hours.  Will give Lasix between  each  unit because of the already underlying peripheral edema.  Will monitor. Dr. Russella Dar was called for a GI evaluation. DD:  12/11/99 TD:  12/12/99 Job: [id number]

## 2010-05-21 NOTE — H&P (Signed)
NAME:  Daisy Bryant, Daisy Bryant                           ACCOUNT NO.:  [id number]   MEDICAL RECORD NO.:  [id number]                   PATIENT TYPE:  AMB   LOCATION:  SDC                                  FACILITY:  [facility]   PHYSICIAN:  Naima A. Dillard, M.D.              DATE OF BIRTH:  Jan 13, 1930   DATE OF ADMISSION:  DATE OF DISCHARGE:                                HISTORY & PHYSICAL   CHIEF COMPLAINT:  Postmenopausal bleeding.   HISTORY AND PHYSICAL:  The patient is a 77 year old African-American female  gravida 4, para 4 who has been menopausal for many years who presented to me  on December 19, 2001 stating that she had some spotting and she had an  increased endometrial stripe on ultrasound.  The patient took Tamoxifen from  1992 to 1997 after a radical mastectomy for breast cancer.  She also used  HRT for many years about five to six years and stopped it in 1991.  The  patient says she only spotted one time after urination and has not seen it  since.  She denies having any history of fibroids.  She denies any new  medications.  No longer has menopausal symptoms, vaginal discharge,  abdominal pain, or increased stress.  The patient attempted endometrial  biopsy with cervical dilation on March 25, 2002 and patient did not tolerate  it so therefore we are proceeding with a D&C, hysteroscopy.   PAST MEDICAL HISTORY:  1. Breast cancer.  2. Hiatal hernia.  3. Hypothyroidism.  4. ADD with depression.  5. History of a bleeding ulcer.   PAST SURGICAL HISTORY:  1. Right radical mastectomy.  2. Tonsillectomy.   MEDICATIONS:  1. Nexium.  2. Celexa.  3. Synthroid.  4. Amitriptyline.   Each medicine taken every day.   ALLERGIES:  The patient is allergic to [facility].   PAST GYN HISTORY:  The patient denies any history of sexually transmitted  diseases.  She is sexually active without any problems.  Her last Pap smear  was 1997.  It was within normal limits.   SOCIAL HISTORY:   Negative x3.   FAMILY HISTORY:  Significant for father having lung cancer.  No GYN cancer.   REVIEW OF SYSTEMS:  ENDOCRINE:  Significant for hypothyroidism.  PSYCHIATRIC:  Significant for ADD and depression.  GENITOURINARY:  Significant for postmenopausal vaginal bleeding.  CARDIOVASCULAR:  Unremarkable.  GASTROINTESTINAL:  Significant for hiatal hernia and GERD.   PHYSICAL EXAMINATION:  GENERAL:  The patient is 231 pounds, blood pressure  120/80.  HEENT:  Pupils are equal.  Hearing is normal.  Throat is clear.  NECK:  Thyroid is not enlarged.  HEART:  Regular rate and rhythm.  CHEST:  Clear to auscultation bilaterally.  BREASTS:  Right breast is absent.  There are no lesions along the chest  wall.  The left breast has no masses, discharge, skin changes, or nipple  retraction.  BACK:  No CVA tenderness.  ABDOMEN:  Nontender without any masses or organomegaly.  EXTREMITIES:  No clubbing, cyanosis, edema.  NEUROLOGIC:  Within normal limits.  PELVIC:  Vulva, vaginal examination is atrophic without masses or lesions.  Cervix is nontender without any lesions, stenotic.  Uterus is normal size,  shape, and consistency and nontender.  Adnexa has no masses and nontender.  Rectovaginal examination has no masses.  The patient has increased  endometrial stripe with postmenopausal vaginal bleeding.   We attempted an endometrial biopsy which was unsuccessful.   PLAN:  Will proceed with D&C, hysteroscopy.  The patient understands the  risks are, but not limited to, bleeding, infection, damage to internal  organs such as bowel, bladder, and major blood vessels.                                               Naima A. Normand Sloop, M.D.    NAD/MEDQ  D:  05/07/2002  T:  05/07/2002  Job:  [id number]

## 2010-05-21 NOTE — Discharge Summary (Signed)
[name], [name] NO.:  [id number]   MEDICAL RECORD NO.:  [id number]          PATIENT TYPE:  INP   LOCATION:  5034                         FACILITY:  [facility]   PHYSICIAN:  Vanita Panda. Magnus Ivan, M.D.DATE OF BIRTH:  [birth year]   DATE OF ADMISSION:  10/08/2004  DATE OF DISCHARGE:  10/12/2004                                 DISCHARGE SUMMARY   ADMISSION DIAGNOSIS:  Left knee severe osteoarthritis/degenerative joint  disease.   DISCHARGE DIAGNOSIS:  Left knee severe degenerative joint disease, status  post total knee arthroplasty.   PROCEDURE:  Left total knee arthroplasty on October 08, 2004.   HOSPITAL COURSE:  Briefly, Daisy Bryant is a 77 year old patient well known to  me. She had severe degenerative joint disease involving her left knee and  failed conservative therapy including injections, NSAIDs, and physical  therapy. The pain in her knee had gotten to the point where it was affecting  her activities of daily living and she was to proceed with elective total  knee arthroplasty. The risks and benefits of this were well explained to her  and documented, and the intricacies of surgery were also explained to her.  She did wish to proceed with surgery.   PROCEDURE DESCRIPTION:  On the day of surgery she was brought to the  operating room, placed on the operating table, and general anesthesia was  obtained. An attempt at a femoral block was obtained as well. The patient  then underwent a left total knee arthroplasty using a computer navigated  system without complications. For a detailed description of the operation,  please refer to the dictated operating note in the patient's medical record.  Postoperatively, she began working well with physical therapy and was even  placed on a CPM to allow for motion of the knee. She progressed well and on  postoperative day #4 was deemed safe for transfer to the rehabilitation  section of the hospital. She was tolerating  oral medications as well as oral  diet. She was transitioned to Coumadin for DVT prophylaxis and was doing  well with this.   DISPOSITION:  To rehabilitation.   DISCHARGE INSTRUCTIONS:  While she is in rehabilitation she will continue on  her Coumadin and oral pain medications and work aggressively with physical  therapy for range of motion of her knee as well as gait training and  generalized activities of daily living. Follow up will be established in my  office two weeks after discharge from therapy.           ______________________________  Vanita Panda. Magnus Ivan, M.D.     CYB/MEDQ  D:  11/30/2004  T:  11/30/2004  Job:  [id number]

## 2010-05-21 NOTE — Op Note (Signed)
NAMECOBI, ALDAPE NO.:  [id number]   MEDICAL RECORD NO.:  [id number]          PATIENT TYPE:  INP   LOCATION:  5034                         FACILITY:  [facility]   PHYSICIAN:  Vanita Panda. Magnus Ivan, M.D.DATE OF BIRTH:  November 14, 1930   DATE OF PROCEDURE:  10/08/2004  DATE OF DISCHARGE:                                 OPERATIVE REPORT   PREOPERATIVE DIAGNOSIS:  Left knee severe degenerative joint disease.   POSTOPERATIVE DIAGNOSIS:  Left knee severe degenerative joint disease.   PROCEDURE:  Left total knee arthroplasty.   COMPONENTS:  DePuy/[employer] RP Sigma cruciate substituting total  knee with size-4 femur, size-3 tibia, size-12.5 polyethylene insert, a 35-mm  patella.   SURGEON:  Vanita Panda. Magnus Ivan, M.D.   ANESTHESIA:  General with regional/femoral block.   ANTIBIOTICS:  Ancef 1 gram IV.   BLOOD LOSS:  100 cc.   TOURNIQUET TIME:  Two hours.   COMPLICATIONS:  None.   INDICATIONS:  Briefly, Ms. Azpeitia is a pleasant 77 year old female who I had  seen over almost a period of a year.  She has had bilateral knee pain for  some time.  We had tried NSAIDs injections and physical therapy on her  knees.  She is certainly an obese individual, weighing between 260 and 280  pounds, but has been a quite motivated and active individual.  After  continued knee pain and severe tricompartmental arthritis of both knees, it  had been recommended that she undergo total knee arthroplasty.  I had given  her information concerning total knees some time ago, and she said she  wanted to think about.  Eventually she felt that this was starting to impact  her activities of daily living and wished to proceed with surgery.  The  risks and benefits of this were explained to her including the risk of deep  venous thrombosis, inferior, blood loss and death.  She wished to proceed  with surgery after the risks and benefits were explained to her and well  understood.   PROCEDURE DESCRIPTION:  After informed consent was obtained and the  appropriate left leg was marked, a femoral block was then obtained and Ms.  Eberlein was brought to the operating room and placed supine on the operating  table.  General anesthesia was then obtained and a nonsterile tourniquet was  placed around her upper thigh.  Her arms and other leg were appropriately  padded.  Then the leg was prepped with DuraPrep from the upper thigh down to  the toes.  Sterile drapes were applied including a sterile stockinette.  Esmarch was used to wrap out the leg, and the tourniquet was inflated to 300  mm of pressure.  Next, a midline incision was made directly over the patella  from the thigh down to the level of the tibial tubercle.  This was carried  down through the soft tissues.  Then a medial parapatellar arthrotomy was  carried out to exposure the knee.  There was a large effusion noted about  the knee and significant findings of tricompartmental arthritis.  The  cartilage  under the trochlea as well as the medial femoral condyle was  completely worn away as well as osteophytes surrounding the lateral  compartment and throughout the tibial plateau as well.  Next, the  osteophytes were removed with the rongeur.  The knee was set up for computer  navigation using the Roscoe system for navigating the knee.  Once the  osteophytes were cleaned, the hip was put through a range of motion and  appropriate points were marked on the leg using the navigation system with  marking the medial and lateral malleolus, the anterior tibia, the anterior  femur, the medial and lateral femoral condyles, the medial and lateral  tibial plateaus, Whiteside's line and the epicondylar axis to map and  generate a computer-generated model of the knee for navigation purposes.  Next, the knee was further cleaned of any soft tissue debris, and we  proceeded with starting first with our tibial cut.   The level was judged  to take approximately 8 mm off of the low side and 10  mm off of the high side of the plateau, a cutting guide was placed in the  tibia, and the tibial cut was made.  Next, appropriate jigs were selected,  and it was felt that a size-4 femur would be appropriate.  The cutting guide  was placed on the femur and then the distal femur cut was made.  After  making the distal femur cut, the next cutting block was placed and  posterior, anterior and chamfer cuts were made as well.  Next, a ligament  balancing guide was placed within the knee and the knee was put through  flexion and extension, and flexion and extension gaps were found to be  equal.  Appropriate soft tissue releases were performed.  Of note, the  patient had a mild varus deformity of a few degrees prior to starting the  case and appropriate soft tissue releases were performed to get the knee in  a better aligned position.  Once the soft tissue balancing was carried out,  the tibia was prepared further with the appropriate guides for inserting the  tibial tray.  Next, trial components were placed in the knee, and the knee  was put through a range of motion.  This included a 12.5-mm polyethylene  trial insert.  The knee was put through a full motion of flexion and  extension and was found to have full extension to at least 120 degrees of  flexion.  The decision was made to then proceed with the patella  resurfacing.  The patella was cut, and a size 35-mm patellar button was  placed as a trial component.  The knee again was put through a range of  motion, and the patella appeared to track well without malalignment.  All  trial components were then removed, and the knee was copiously irrigated  with pulsatile lavage solution.  The decision was made then to place the  real components, a size-4 femur with a size-3 tibial tray was selected.  Again, this was a cruciate-substituting knee.  A 35-mm patella was also selected.  DePuy SmartSet  cement was then mixed, and the components were  cemented into place without difficulty.  Extruding cement was removed with  Glorious Peach and other instrumentation.  The patella was secured as well.  Once the  components had dried, a 12.5-mm trial insert was placed.  The knee was put  through a range of motion and this was found to be stable, thus  the trial  polyethylene insert was removed and a 12.5 real polyethylene insert was  placed.  The knee again was put through a range of motion and found to be  stable.   The tourniquet was then deflated at exactly two hours.  The knee was  pulsatile lavaged again with an additional three liters of pulsatile lavage  solution.  Hemostasis was obtained with electrocautery.  A medium Hemovac  was placed in the knee joint.  The parapatellar arthrotomy incision was then  closed with interrupted #1 Ethibond suture.  Deep tissues were closed with  interrupted 0 Vicryl followed by interrupted 2-0 Vicryl in the subcutaneous  tissues.  Staples were used to close the midline incision as well as the  small incisions for the femoral and tibial pins for the navigation devices.  These pins were removed at the end of the case as well.  A sterile dressing  was placed around the knee followed by a knee immobilizer.  The patient was  awakened, extubated and taken to the recovery room in stable condition.           ______________________________  Vanita Panda. Magnus Ivan, M.D.     CYB/MEDQ  D:  10/09/2004  T:  10/09/2004  Job:  [id number]

## 2010-05-21 NOTE — Discharge Summary (Signed)
Daisy Bryant, Daisy [name]:  [id number]   MEDICAL RECORD NO.:  [id number]          PATIENT TYPE:  ORB   LOCATION:  4522                         FACILITY:  [facility]   PHYSICIAN:  Daisy Bryant, P.A.  DATE OF BIRTH:  [birth year]   DATE OF ADMISSION:  10/12/2004  DATE OF DISCHARGE:  10/18/2004                                 DISCHARGE SUMMARY   DISCHARGE DIAGNOSES:  1.  Left total knee arthroplasty secondary to degenerative disk disease      October 08, 2004.  2.  Pain management.  3.  Coumadin for deep vein thrombosis prophylaxis.  4.  Postoperative anemia.  5.  Hypertension.  6.  Hypothyroidism.  7.  Hyperlipidemia  8.  Depression.  9.  Morbid obesity.   HISTORY OF PRESENT ILLNESS:  This is a 77 year old female with history of  bilateral knee pain, left greater than right, secondary to degenerative  joint disease and no relief with conservative care.  Underwent a left total  knee arthroplasty October 08, 2004, per Dr. Magnus Bryant.  Placed on Coumadin for  deep vein thrombosis prophylaxis, weightbearing as tolerated.  Postoperative  anemia 7.7.  Transfused two units of packed red blood cells October 7 with  hemoglobin improved to 9.0.  No chest pain.  No shortness of breath.  The  patient was admitted to [facility].   PAST MEDICAL HISTORY:  See discharge diagnoses.  No alcohol or tobacco.   ALLERGIES:  IODINE.   SOCIAL HISTORY:  Lives with husband.  Husband works out of the house but can  do no lifting.  Local daughter works.  One level home.  One step to entry.   MEDICATIONS:  1.  Elavil 25 mg two tablets at bedtime.  2.  Hydrochlorothiazide 25 mg daily.  3.  Lipitor 20 mg at bedtime.  4.  Synthroid 50 mcg daily.  5.  Aciphex 20 mg daily.  6.  Celexa 40 mg one half tablet daily.   HOSPITAL COURSE:  Patient with progressive gains while on rehabilitation  services with therapies initiated on a daily basis.  The following issues  are followed  during patient's rehab course.  Pertaining to [name] left  total knee arthroplasty, October 08, 2004, surgical site healing nicely.  No  signs or infections.  She was weightbearing as tolerated, ambulating  extended distances with a walker.  Home health therapies had been arranged.  She was using Vicodin, Robaxin as needed for pain management with good  result.  Coumadin for deep vein thrombosis prophylaxis with latest INR of  2.8.  She would complete Coumadin protocol as advised.  Postoperative anemia  stable with latest hemoglobin of 8.8, hematocrit 26.9 and she continued on  iron supplement.  Blood pressures were monitored with hydrochlorothiazide.  Diastolic pressures 76-87.  She remained on a hormone supplement for  hypothyroidism.  Lipitor for hyperlipidemia without issue.  She had a  history of depression and she was on Celexa as prior to hospital admission.   Overall, for her functional status, she was minimal assist bed mobility,  minimal guard  for transfers, minimal guard ambulating with a rolling walker,  minimal assist car transfers, navigating stares with supervision, simple  setup for activities of daily living, upper body and minimal assist lower  body.  She was discharged to home in stable condition.   LABORATORY DATA:  Latest INR of 2.8, hemoglobin 8.8, hematocrit 26.9,  platelets 340,000.  Sodium 132, potassium 3.1 with supplement added.  BUN  12, creatinine 1.1.   DISCHARGE MEDICATIONS:  1.  Coumadin with latest dose of 4 mg to be completed on November 08, 2004.  2.  Trinsicon capsule twice daily.  3.  Elavil 50 mg at bedtime.  4.  Hydrochlorothiazide 25 mg daily.  5.  Zocor 40 mg daily.  6.  Synthroid 50 mcg daily.  7.  Protonix 40 mg daily.  8.  Celexa 20 mg daily.  9.  Vicodin 5/500 1-2 tablets q.4 h p.r.n. pain.  10. Robaxin 500 mg q.6 h  p.r.n. spasms.   DISCHARGE INSTRUCTIONS:  1.  Activity:  As tolerated.  2.  Diet:  Regular.  3.  Wound Care:   Cleanse incision daily with warm soap and water.  4.  Special Instructions:  Home Health nurse to complete Coumadin protocol.   FOLLOWUP:  The patient should follow up with Dr. Magnus Bryant, Orthopedic  Services, call for an appointment; Dr. Debby Bryant, Medical Management.      Daisy Bryant, P.A.     DA/MEDQ  D:  10/18/2004  T:  10/18/2004  Job:  [id number]   cc:   Daisy Bryant. Daisy Bryant, M.D.  Fax: [phone]   Daisy Gess. Bryant, M.D. [facility]  [address]  [place]  [place] [address]

## 2010-05-21 NOTE — Op Note (Signed)
NAME:  Daisy Bryant, Daisy Bryant                           ACCOUNT NO.:  [id number]   MEDICAL RECORD NO.:  [id number]                   PATIENT TYPE:  AMB   LOCATION:  SDC                                  FACILITY:  [facility]   PHYSICIAN:  Naima A. Dillard, M.D.              DATE OF BIRTH:  [birth year]   DATE OF PROCEDURE:  05/08/2002  DATE OF DISCHARGE:                                 OPERATIVE REPORT   PREOPERATIVE DIAGNOSIS:  Postmenopausal vaginal bleeding with a history of  Tamoxifen x5 years.   POSTOPERATIVE DIAGNOSIS:  Postmenopausal vaginal bleeding with a history of  Tamoxifen x5 years.   PROCEDURE:  1. Dilatation and curettage.  2. Hysteroscopy directed biopsy of endometrium.  3. Repair of cervical laceration from the tenaculum.   SURGEON:  Naima A. Normand Sloop, M.D.   ANESTHESIA:  General laryngeal mask airway.   IV FLUIDS:  400 mL.   ESTIMATED BLOOD LOSS:  Minimal.   COMPLICATIONS:  None.   FINDINGS:  Atrophic endometrium.  There was one area of fluffy endometrium  near the left ostium.  No fibroids and no polyps noted.   PROCEDURE IN DETAIL:  The patient was taken to the operating room where she  was given general anesthesia with laryngeal mask airway.  She was placed in  dorsal lithotomy position and prepped and draped in a normal sterile fashion  with Hibiclens.  The bladder was drained about 100 mL.  The patient was  examined and noted to have small anteverted uterus and no adnexal masses.  A  bivalve speculum was placed into the vagina.  The anterior lip of the cervix  was grasped with a single tooth tenaculum.  Cervix was then tried to dilate  with the sound but because of stenosis the cervix was dilated with mini  dilators first.  Once the os was open the uterus sounded to 7 cm.  The  cervix was further dilated with Pratt dilators up to 21.  The diagnostic  scope was placed into the uterine cavity.  Both ostia were visualized.  There were no fibroids and no polyps.  There  was mainly atrophic  endometrium, but one area of fluffy endometrium located near the left ostia.  Because of her history of Tamoxifen use, I wanted to do a directed biopsy of  this area so cervix was further dilated with Shawnie Pons dilators up to 31.  The  hysteroscope was placed into the uterus and a biopsy forceps was then used  to remove the fluffy area of endometrium.  The hysteroscope was then  removed.  A sharp curettage was then done.  Minimal curettings were  obtained.  There was a small laceration maybe about 0.5 cm in size on the  anterior lip that was created from the tenaculum, from pulling the  tenaculum.  This was  repaired with a figure-of-eight stitch of 0 Vicryl.  All instruments were  removed from the vagina and cervix.  Hemostasis was noted at the cervix.  Sponge, lap, and needle counts were correct x2.  The patient went to  recovery room in stable condition.                                               Naima A. Normand Sloop, M.D.    NAD/MEDQ  D:  05/08/2002  T:  05/08/2002  Job:  [id number]

## 2010-05-21 NOTE — H&P (Signed)
Daisy Bryant, VIZCARRONDO NO.:  [id number]   MEDICAL RECORD NO.:  [id number]          PATIENT TYPE:  ORB   LOCATION:  4522                         FACILITY:  [facility]   PHYSICIAN:  Erick Colace, M.D.DATE OF BIRTH:  [birth year]   DATE OF ADMISSION:  10/12/2004  DATE OF DISCHARGE:                                HISTORY & PHYSICAL   REASON FOR ADMISSION:  Left total knee replacement with reduced self care  and mobility skills.   HISTORY:  A 77 year old female with history of bilateral knee pain, left  greater than right, secondary to end-stage DJD.  She had no improvement with  conservative care and, therefore, underwent left total knee replacement  October 08, 2004, by Dr. Magnus Ivan.  She was placed on Warfarin protocol for  DVT prophylaxis and made weightbearing as tolerated.  She had postoperative  acute blood loss anemia with hemoglobin of 7.7 and was transfused 2 units of  packed red blood cells on October 09, 2004, with followup hemoglobin of 9.  She is now transferred to subacute care unit for a rehab program involving  PT and OT.   REVIEW OF SYSTEMS:  Negative chest pain, negative nausea and vomiting.  Positive reflux, positive joint swelling.   PAST MEDICAL HISTORY:  1.  Hypertension.  2.  Hyperlipidemia.  3.  Hypothyroidism.  4.  Morbid obesity.  5.  Tonsillectomy.  6.  Right mastectomy.  7.  Right knee arthroplasty.  8.  Depression.   Negative ETOH or tobacco usage.   FAMILY HISTORY:  Significant for father with lung cancer.  Mother died of  sepsis at 100 or 57.  One brother died of suicide.   SOCIAL HISTORY:  Lives with husband.  One step to enter one-level home.  Husband works outside of the house and unable to lift.  Local daughter  works.   FUNCTIONAL HISTORY:  Independent prior to admission.  Current functional  status: Needs assistance with ADLs and mobility.   MEDICATIONS AT HOME:  1.  Elavil 50 nightly.  2.  Hydrochlorothiazide 25  p.o. daily.  3.  Lipitor 20 mg p.o. nightly.  4.  Synthroid 50 mcg p.o. daily.  5.  Aciphex 20 mg p.o. daily.  6.  Celexa 40 mg 1/2 p.o. daily.   ALLERGIES:  DIOVAN.   PHYSICAL EXAMINATION:  VITAL SIGNS: Blood pressure 106/65, pulse 81,  respirations 20, temperature 98.7.  GENERAL: Morbidly obese female in no acute distress.  HEENT: Eyes anicteric, not injected.  External ENT normal.  NECK:  Supple without adenopathy.  LUNGS:  Respiratory effort is good, lungs clear.  HEART: Regular rate and rhythm. No murmurs, rubs, or extra sounds.  ABDOMEN: Positive bowel sounds.  Soft, nontender to palpation.  EXTREMITIES:  No clubbing, cyanosis, or edema.  NEUROLOGIC: Motor strength bilateral upper extremities 5/5.  Right lower  extremity 4/5, hip flexor 4/5, quad, 5/5 TA and gastroc. She has only 1/5  hip flexor and quad strength and 4/5 TA and gastroc on left side.  Mood,  memory, orientation, mood, and affect are intact.   IMPRESSION:  1.  Left total knee arthroplasty secondary to degenerative joint disease      October 08, 2004, postoperative day #4 with decreased self care and      mobility skills.  Will initiate [facility] level PT, OT.  2.  Pain management with Vicodin and Robaxin p.r.n.  3.  Deep vein thrombosis prophylaxis with Coumadin per pharmacy protocol.  4.  Postoperative anemia.  Follow up CBC, monitor.  5.  Hypertension.  Continue hydrochlorothiazide.  6.  Hypothyroidism.  Continue Synthroid.  7.  Hyperlipidemia.  Continue Zocor.  8.  Depression. Continue Celexa.   Estimated length of stay is 7 days.  Good rehab candidate.      Erick Colace, M.D.  Electronically Signed     AEK/MEDQ  D:  10/12/2004  T:  10/12/2004  Job:  [id number]   cc:   Rosalyn Gess. Norins, M.D. [facility]  [address]  [place]  [place] [address]   Vanita Panda. Magnus Ivan, M.D.  Fax: [phone]

## 2010-05-21 NOTE — Discharge Summary (Signed)
[place]  Patient:    Daisy Bryant, Daisy Bryant                        MRN: [id number] Adm. Date:  [id number] Disc. Date: 12/13/99 Attending:  Evette Georges Dictator:   Cornell Barman, P.A. CC:         Venita Lick. Pleas Koch., M.D. [facility]  Rosalyn Gess. Norins, M.D. [facility]   Discharge Summary  DISCHARGE DIAGNOSES: 1. Gastrointestinal bleed. 2. Anemia. 3. Gastric ulcer. 4. CLOtest positive.  BRIEF ADMISSION HISTORY:  Daisy Bryant is a 77 year old white female who presented with three day history of black stools.  The patient has been on Aleve twice daily secondary to arthritis pain.  LABS ON ADMISSION:  Hemoglobin 6.4, MCV 86.5.  BUN 49, glucose 151.  HOSPITAL COURSE:  #1 - GASTROINTESTINAL BLEED:  The patient was admitted with significant microcytic anemia.  The patient was transfused three units of packed red blood cells.  Venita Lick. Pleas Koch., M.D., performed an endoscopy on December 11, 1999.  This revealed a hiatal hernia and gastric ulcers.  She also had a stricture in the distal esophagus. Biopsies were obtained.  Her CLOtest biopsy was positive for Helicobacter pylori.  The patient has remained hemodynamically stable since her transfusion.  The patient was felt to be stable for discharge home.  #2 - DEGENERATIVE JOINT DISEASE:  The patient has been instructed not to take aspirin or nonsteroidal anti-inflammatories.  The patient is to use Darvocet or Tylenol as needed for pain.  LABS AT DISCHARGE:  Hemoglobin 9.3, hematocrit 27.  Helicobacter screen was positive.  Lipase was 31, amylase 43.  MEDICATIONS AT DISCHARGE: 1. Protonix 40 mg b.i.d. for 10 days then q.d. 2. Biaxin 500 mg q.d. for 10 days. 3. Amoxicillin 500 mg b.i.d. for 10 days. 4. Celexa 20 mg q.d. 5. Elavil 10 mg h.s. 6. Synthroid 50 mcg q.d. 7. Darvocet-N 100 one to two tablets q.4-6h. p.r.n.  FOLLOW-UP:  The patient is to follow up with Casimiro Needle E. Norins, M.D., in two to  three weeks and Dr. Russella Dar in two to three weeks. DD:  12/13/99 TD:  12/13/99 Job: [id number]

## 2010-10-22 ENCOUNTER — Other Ambulatory Visit: Payer: Self-pay | Admitting: Internal Medicine

## 2010-11-07 ENCOUNTER — Other Ambulatory Visit: Payer: Self-pay | Admitting: Internal Medicine

## 2010-11-09 ENCOUNTER — Other Ambulatory Visit: Payer: Self-pay | Admitting: Internal Medicine

## 2010-11-10 ENCOUNTER — Other Ambulatory Visit: Payer: Self-pay | Admitting: *Deleted

## 2010-11-10 MED ORDER — OMEPRAZOLE 40 MG PO CPDR: 40.0000 mg | DELAYED_RELEASE_CAPSULE | Freq: Every day | ORAL | Status: DC

## 2010-11-10 NOTE — Telephone Encounter (Signed)
k

## 2010-11-12 NOTE — Telephone Encounter (Signed)
Ok to refill all 4 rx x 11

## 2010-11-15 NOTE — Telephone Encounter (Signed)
Ok for refill prn 

## 2010-11-17 ENCOUNTER — Other Ambulatory Visit: Payer: Self-pay | Admitting: Internal Medicine

## 2011-01-08 ENCOUNTER — Ambulatory Visit (INDEPENDENT_AMBULATORY_CARE_PROVIDER_SITE_OTHER): Payer: Medicare Other

## 2011-01-08 DIAGNOSIS — M542 Cervicalgia: Secondary | ICD-10-CM

## 2011-01-27 ENCOUNTER — Other Ambulatory Visit: Payer: Self-pay | Admitting: Internal Medicine

## 2011-04-23 ENCOUNTER — Other Ambulatory Visit: Payer: Self-pay | Admitting: Internal Medicine

## 2011-04-26 ENCOUNTER — Telehealth: Payer: Self-pay | Admitting: *Deleted

## 2011-04-26 NOTE — Telephone Encounter (Signed)
Patient requesting Rx for "Bra Prosthesis" to [facility] Fax: [phone]/SLS

## 2011-04-27 NOTE — Telephone Encounter (Signed)
OK- please prepare RX

## 2011-04-29 NOTE — Telephone Encounter (Signed)
Order written and faxed to [facility]/SLS

## 2011-07-13 ENCOUNTER — Other Ambulatory Visit: Payer: Self-pay | Admitting: Internal Medicine

## 2011-08-04 ENCOUNTER — Encounter: Payer: Self-pay | Admitting: Internal Medicine

## 2011-08-04 ENCOUNTER — Ambulatory Visit (INDEPENDENT_AMBULATORY_CARE_PROVIDER_SITE_OTHER): Payer: Medicare Other | Admitting: Internal Medicine

## 2011-08-04 ENCOUNTER — Other Ambulatory Visit (INDEPENDENT_AMBULATORY_CARE_PROVIDER_SITE_OTHER): Payer: Medicare Other

## 2011-08-04 VITALS — BP 118/80 | HR 71 | Temp 98.0°F | Resp 16 | Wt 226.0 lb

## 2011-08-04 DIAGNOSIS — R300 Dysuria: Secondary | ICD-10-CM

## 2011-08-04 LAB — URINALYSIS, ROUTINE W REFLEX MICROSCOPIC
Hgb urine dipstick: NEGATIVE
Nitrite: NEGATIVE
Specific Gravity, Urine: 1.025 (ref 1.000–1.030)
Total Protein, Urine: NEGATIVE
Urobilinogen, UA: 0.2 (ref 0.0–1.0)

## 2011-08-04 MED ORDER — ESTROGENS, CONJUGATED 0.625 MG/GM VA CREA: TOPICAL_CREAM | Freq: Every day | VAGINAL | Status: DC

## 2011-08-04 NOTE — Patient Instructions (Addendum)
Pain with urination = dysuria, but your symptoms do not strongly suggest a bladder infection. Your symptoms are suggestion of atrophic vaginitis - thinning of the vaginal mucosa due to estrogen deprivation. Plan Urinalysis to be sure you do not have an infection.  Topical estrogen cream: premarin cream- 1 g (vaginal applicator) daily for 1 week then twice a week after that. It takes 4-8 weeks to see real results.   Atrophic Vaginitis Atrophic vaginitis is a problem of low levels of estrogen in women. This problem can happen at any age. It is most common in women who have gone through menopause ("the change").   HOW WILL I KNOW IF I HAVE THIS PROBLEM? You may have:  Trouble with peeing (urinating), such as:   Going to the bathroom often.   A hard time holding your pee until you reach a bathroom.   Leaking pee.   Having pain when you pee.   Itching or a burning feeling.   Vaginal bleeding and spotting.   Pain during sex.   Dryness of the vagina.   A yellow, bad-smelling fluid (discharge) coming from the vagina.  HOW WILL MY DOCTOR CHECK FOR THIS PROBLEM?  During your exam, your doctor will likely find the problem.   If there is a vaginal fluid, it may be checked for infection.  HOW WILL THIS PROBLEM BE TREATED? Keep the vulvar skin as clean as possible. Moisturizers and lubricants can help with some of the symptoms. Estrogen replacement can help. There are 2 ways to take estrogen:  Systemic estrogen gets estrogen to your whole body. It takes many weeks or months before the symptoms get better.   You take an estrogen pill.   You use a skin patch. This is a patch that you put on your skin.   If you still have your uterus, your doctor may ask you to take a hormone. Talk to your doctor about the right medicine for you.   Estrogen cream.  This puts estrogen only at the part of your body where you apply it. The cream is put into the vagina or put on the vulvar skin. For some  women, estrogen cream works faster than pills or the patch.  CAN ALL WOMEN WITH THIS PROBLEM USE ESTROGEN? No. Women with certain types of cancer, liver problems, or problems with blood clots should not take estrogen. Your doctor can help you decide the best treatment for your symptoms. Document Released: 06/08/2007 Document Revised: 12/09/2010 Document Reviewed: 06/08/2007 [facility] Patient Information 2012 [place], [place].

## 2011-08-04 NOTE — Progress Notes (Signed)
  Subjective:    Patient ID: Daisy Bryant, female    DOB: [birth year], 78 y.o.   MRN: [id number]  HPI Daisy Bryant presents with a 7 day h/o urinary frequency, dysuria both at the meatus and in the bladder. No post void spasm. No fevers or chills, no flank pain, no suprapubic pain. Admits to vaginal dryness  No past medical history on file. No past surgical history on file. No family history on file. History   Social History  . Marital Status: Widowed    Spouse Name: N/A    Number of Children: N/A  . Years of Education: N/A   Occupational History  . Not on file.   Social History Main Topics  . Smoking status: Never Smoker   . Smokeless tobacco: Not on file  . Alcohol Use: Not on file  . Drug Use: Not on file  . Sexually Active: Not on file   Other Topics Concern  . Not on file   Social History Narrative  . No narrative on file    Current Outpatient Prescriptions on File Prior to Visit  Medication Sig Dispense Refill  . amitriptyline (ELAVIL) 25 MG tablet TAKE 1 TO 3 TABLETS AT BEDTIME  180 tablet  2  . citalopram (CELEXA) 20 MG tablet TAKE 1 TABLET DAILY  90 tablet  2  . hydrochlorothiazide (HYDRODIURIL) 25 MG tablet TAKE 1 TABLET DAILY  30 tablet  0  . levothyroxine (SYNTHROID, LEVOTHROID) 50 MCG tablet TAKE 1 TABLET DAILY  90 tablet  0  . omeprazole (PRILOSEC) 40 MG capsule TAKE ONE CAPSULE EVERY MORNING  90 capsule  3  . simvastatin (ZOCOR) 20 MG tablet TAKE 1 TABLET EVERY EVENING  90 tablet  0      Review of Systems System review is negative for any constitutional, cardiac, pulmonary, GI or neuro symptoms or complaints other than as described in the HPI.     Objective:   Physical Exam Filed Vitals:   08/04/11 1615  BP: 118/80  Pulse: 71  Temp: 98 F (36.7 C)  Resp: 16   Wt Readings from Last 3 Encounters:  08/04/11 226 lb (102.513 kg)  02/24/10 220 lb (99.791 kg)  11/03/09 226 lb (102.513 kg)   Gen'l- older overweight white woman in no acute  distress HEENT- C&S clear Cor - RRR Pulm - normal respirations.  U/A negative      Assessment & Plan:  Dysuria - few signs of UTI. Suspect atrophic vaginitis as source of discomfort.  Plan Topical estrogen cream - Premarin to be used daily for 10-14 days then twice a week.

## 2011-08-10 ENCOUNTER — Encounter: Payer: Self-pay | Admitting: Internal Medicine

## 2011-09-23 ENCOUNTER — Other Ambulatory Visit: Payer: Self-pay | Admitting: Internal Medicine

## 2011-10-01 ENCOUNTER — Other Ambulatory Visit: Payer: Self-pay | Admitting: Internal Medicine

## 2011-10-08 ENCOUNTER — Other Ambulatory Visit: Payer: Self-pay | Admitting: Internal Medicine

## 2011-11-07 ENCOUNTER — Encounter (HOSPITAL_COMMUNITY): Payer: Self-pay | Admitting: Internal Medicine

## 2011-11-07 ENCOUNTER — Telehealth: Payer: Self-pay | Admitting: Internal Medicine

## 2011-11-07 ENCOUNTER — Observation Stay (HOSPITAL_COMMUNITY)
Admission: AD | Admit: 2011-11-07 | Discharge: 2011-11-10 | Disposition: A | Discharge status: Home / Self Care | Payer: Medicare Other | Source: Ambulatory Visit | Attending: Internal Medicine | Admitting: Internal Medicine

## 2011-11-07 DIAGNOSIS — D649 Anemia, unspecified: Secondary | ICD-10-CM

## 2011-11-07 DIAGNOSIS — Z01812 Encounter for preprocedural laboratory examination: Secondary | ICD-10-CM | POA: Insufficient documentation

## 2011-11-07 DIAGNOSIS — K319 Disease of stomach and duodenum, unspecified: Secondary | ICD-10-CM

## 2011-11-07 DIAGNOSIS — K279 Peptic ulcer, site unspecified, unspecified as acute or chronic, without hemorrhage or perforation: Secondary | ICD-10-CM | POA: Diagnosis present

## 2011-11-07 DIAGNOSIS — R109 Unspecified abdominal pain: Secondary | ICD-10-CM

## 2011-11-07 DIAGNOSIS — I10 Essential (primary) hypertension: Secondary | ICD-10-CM

## 2011-11-07 DIAGNOSIS — K449 Diaphragmatic hernia without obstruction or gangrene: Secondary | ICD-10-CM

## 2011-11-07 DIAGNOSIS — E039 Hypothyroidism, unspecified: Secondary | ICD-10-CM | POA: Diagnosis present

## 2011-11-07 DIAGNOSIS — K222 Esophageal obstruction: Secondary | ICD-10-CM | POA: Insufficient documentation

## 2011-11-07 DIAGNOSIS — D500 Iron deficiency anemia secondary to blood loss (chronic): Principal | ICD-10-CM | POA: Insufficient documentation

## 2011-11-07 DIAGNOSIS — D509 Iron deficiency anemia, unspecified: Secondary | ICD-10-CM

## 2011-11-07 HISTORY — DX: Respiratory tuberculosis unspecified: A15.9

## 2011-11-07 HISTORY — DX: Anemia, unspecified: D64.9

## 2011-11-07 HISTORY — DX: Personal history of other diseases of the digestive system: Z87.19

## 2011-11-07 HISTORY — DX: Malignant (primary) neoplasm, unspecified: C80.1

## 2011-11-07 HISTORY — DX: Major depressive disorder, single episode, unspecified: F32.9

## 2011-11-07 HISTORY — DX: Gastro-esophageal reflux disease without esophagitis: K21.9

## 2011-11-07 HISTORY — DX: Unspecified osteoarthritis, unspecified site: M19.90

## 2011-11-07 HISTORY — DX: Essential (primary) hypertension: I10

## 2011-11-07 HISTORY — DX: Anxiety disorder, unspecified: F41.9

## 2011-11-07 HISTORY — DX: Myoneural disorder, unspecified: G70.9

## 2011-11-07 HISTORY — DX: Hypothyroidism, unspecified: E03.9

## 2011-11-07 HISTORY — DX: Depression, unspecified: F32.A

## 2011-11-07 LAB — BASIC METABOLIC PANEL
CO2: 27 mEq/L (ref 19–32)
Calcium: 8.9 mg/dL (ref 8.4–10.5)
Potassium: 3.8 mEq/L (ref 3.5–5.1)
Sodium: 138 mEq/L (ref 135–145)

## 2011-11-07 LAB — HEMATOCRIT: HCT: 24.6 % — ABNORMAL LOW (ref 36.0–46.0)

## 2011-11-07 LAB — HEMOGLOBIN: Hemoglobin: 7.3 g/dL — ABNORMAL LOW (ref 12.0–15.0)

## 2011-11-07 MED ORDER — CITALOPRAM HYDROBROMIDE 20 MG PO TABS
20.0000 mg | ORAL_TABLET | Freq: Every day | ORAL | Status: DC
  Administered 2011-11-08 – 2011-11-10 (×2): 20 mg via ORAL
  Filled 2011-11-07 (×3): qty 1

## 2011-11-07 MED ORDER — ACETAMINOPHEN 650 MG RE SUPP: 650.0000 mg | Freq: Four times a day (QID) | RECTAL | Status: DC | PRN

## 2011-11-07 MED ORDER — HYDROCHLOROTHIAZIDE 25 MG PO TABS
25.0000 mg | ORAL_TABLET | Freq: Every day | ORAL | Status: DC
  Administered 2011-11-08 – 2011-11-10 (×2): 25 mg via ORAL
  Filled 2011-11-07 (×3): qty 1

## 2011-11-07 MED ORDER — AMITRIPTYLINE HCL 50 MG PO TABS
50.0000 mg | ORAL_TABLET | Freq: Every day | ORAL | Status: DC
  Administered 2011-11-07 – 2011-11-09 (×3): 50 mg via ORAL
  Filled 2011-11-07 (×4): qty 1

## 2011-11-07 MED ORDER — ACETAMINOPHEN 325 MG PO TABS: 650.0000 mg | ORAL_TABLET | Freq: Four times a day (QID) | ORAL | Status: DC | PRN

## 2011-11-07 MED ORDER — SODIUM CHLORIDE 0.9 % IV SOLN
INTRAVENOUS | Status: DC
  Administered 2011-11-07 – 2011-11-09 (×2): via INTRAVENOUS

## 2011-11-07 MED ORDER — LEVOTHYROXINE SODIUM 50 MCG PO TABS
50.0000 ug | ORAL_TABLET | Freq: Every day | ORAL | Status: DC
  Administered 2011-11-08 – 2011-11-10 (×3): 50 ug via ORAL
  Filled 2011-11-07 (×5): qty 1

## 2011-11-07 MED ORDER — FUROSEMIDE 10 MG/ML IJ SOLN
20.0000 mg | Freq: Once | INTRAMUSCULAR | Status: AC
  Administered 2011-11-07: 20 mg via INTRAVENOUS
  Filled 2011-11-07: qty 2

## 2011-11-07 MED ORDER — PANTOPRAZOLE SODIUM 40 MG PO TBEC
40.0000 mg | DELAYED_RELEASE_TABLET | Freq: Every day | ORAL | Status: DC
  Administered 2011-11-07: 40 mg via ORAL
  Filled 2011-11-07 (×2): qty 1

## 2011-11-07 NOTE — Telephone Encounter (Signed)
Spoke with daughter of patient with instructions of need for mother to be admitted this evening at [facility] for accute anemia. Daughter instructed to come here to Dr, Debby Bud office [facility] at 4:30 this evening for admission orders and talk with patient.

## 2011-11-07 NOTE — Telephone Encounter (Signed)
Spoke with Zella Ball at [facility] admission with orders of patient to be admitted MedSurg, diagnosis Acute Anemia.

## 2011-11-07 NOTE — Telephone Encounter (Signed)
Caller: Suzanne/Child; Patient Name: Daisy Bryant; PCP: Illene Regulus (Adults only); Best Callback Phone Number: [phone]; Reason for call: Other Pt is calling from [place] - she went to [facility] yesterday. Her hgb was 8. She states the MD there advised that she should be seen by her MD within 24h. She is on her way home today. Pt had an EKG and CXR. Pt was becoming SOB after moving around. all was normal except the Hgb. Pt was dx with anemia. 02 sats were 98% on r/a.  RN triaged  and scheduled an appt tomorrow at 11:00 with Dr.Norins. Pt states is she is still she is ok/if she moves around she is SOB.  Pts daughter is faxing the records/she is asking that staff call her back. Should her Mom be transfused today? can she wait until tomorrow. RN sent office note. OFFICE PLEASE CALL THE DAUGHTER/SUZANNE  BACK ON HER CELL # [phone]

## 2011-11-07 NOTE — H&P (Signed)
Daisy Bryant is an 78 y.o. female.   Chief Complaint: Known anemia with shortness of breath and weakness HPI: Daisy Bryant has a h/o UGI bleed to peptic ulcer in the antrum April '10 by EGD. She has been on omeprazole. For several weeks she has noticed a drop in level of energy and has been canceling activities. Last Thursday she noted that she was very weak and short of breath with minimal activity. Due to these symptoms she presented to Urgent Care [facility] with [facility] Sunday November 3rd  where she was found to have a Hgb of 8.0 grams but heme negative stool. She declined hospitalization so that she could return home. She continues to be short of breath with minimal activity, she gets a strong palpation (thumping) and mild chest pressure. She is now for observation in order to transfuse due to symptomatic anemia and for GI evaluation.    Past Medical History: CELIAC DISEASE (ICD-579.0) Hx of DIVERTICULOSIS, COLON (ICD-562.10) BREAST BIOPSY, HX OF X2 (ICD-V15.9) PERIPHERAL NEUROPATHY (ICD-356.9) TONSILLECTOMY, UNDER AGE 64, HX OF (ICD-V15.89) MASTECTOMY, MODIFIED RADICAL, HX OF RIGHT (ICD-V45.71) Hx of ADENOCARCINOMA, BREAST, RIGHT (ICD-174.9) GASTROINTESTINAL HEMORRHAGE, HX OF (ICD-V12.79) HYPOTHYROIDISM (ICD-244.9) HYPERLIPIDEMIA (ICD-272.4) KNEE REPLACEMENT, LEFT, HX OF (ICD-V43.65) DEGENERATIVE JOINT DISEASE (ICD-715.90) DEPRESSION (ICD-311) ATTENTION DEFICIT DISORDER (ICD-314.00) ROSACEA (ICD-695.3) Hx of COUGH, CHRONIC (ICD-786.2) GASTROESOPHAGEAL REFLUX DISEASE (ICD-530.81) HYPERTENSION (ICD-401.9) Esophageal stricture 2001 Gastric ulcer with bleeding 2001  Past Surgical History BREAST BIOPSY, HX OF X2 (ICD-V15.9) TONSILLECTOMY, UNDER AGE 66, HX OF (ICD-V15.89) MASTECTOMY, MODIFIED RADICAL, HX OF RIGHT (ICD-V45.71) KNEE REPLACEMENT, LEFT, HX OF (ICD-V43.65)  Family History: Lung cancer: father Family History of Heart Disease:  Mother      Social History:  History   Social History  . Marital Status: Widowed    Spouse Name: N/A    Number of Children: 4  . Years of Education: 17   Occupational History  . counseling -retired    Social History Main Topics  . Smoking status: Never Smoker   . Smokeless tobacco: Never Used  . Alcohol Use: 0.5 oz/week    1 drink(s) per week     Comment: wine  . Drug Use: No  . Sexually Active: No   Other Topics Concern  . 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 Married '55 - 2 years/divorced, married '65 - 2007/widowed. 2 sons - '55, '69; 2 dtr - '56, '68; 3 grandchildren. LIves alone and is independent all ADLs.  ACP- no CPR,  OK for short term mechanical ventilation, no heroic or futile measure in the face of irreversible disease.     Allergies:  Allergies  Allergen Reactions  . Esomeprazole Magnesium     REACTION: causes diarrhea  . Iodine     REACTION: itching   No current facility-administered medications on file prior to encounter.   Current Outpatient Prescriptions on File Prior to Encounter  Medication Sig Dispense Refill  . amitriptyline (ELAVIL) 25 MG tablet TAKE 1 TO 3 TABLETS AT BEDTIME  180 tablet  2  . citalopram (CELEXA) 20 MG tablet TAKE 1 TABLET DAILY  90 tablet  1  . conjugated estrogens (PREMARIN) vaginal cream Place vaginally daily. 1 vaginal application daily for 10 days then twice a week on an on-going basis.  42.5 g  12  . hydrochlorothiazide (HYDRODIURIL) 25 MG tablet TAKE 1 TABLET DAILY (NO FURTHER REFILLS WITHOUT  OFFICE VISIT)  30 tablet  0  . levothyroxine (SYNTHROID, LEVOTHROID) 50 MCG tablet TAKE 1 TABLET DAILY. OVERDUE FOR PHYSICAL. MUST BE SEEN FOR REFILL.  90 tablet  3  . omeprazole (PRILOSEC) 40 MG capsule TAKE ONE CAPSULE EVERY MORNING  90 capsule  3  . simvastatin (ZOCOR) 20 MG tablet TAKE 1 TABLET EVERY EVENING  90 tablet  3    Review of  Systems  Constitutional: Positive for malaise/fatigue.  Eyes: Negative.   Respiratory: Positive for shortness of breath. Negative for hemoptysis and wheezing.   Cardiovascular: Positive for chest pain and palpitations.       Short of breath with activity  Gastrointestinal: Negative for heartburn, nausea, abdominal pain and melena.  Genitourinary: Negative.   Musculoskeletal: Positive for myalgias.  Skin: Negative.   Neurological: Positive for weakness and headaches.  Endo/Heme/Allergies: Negative.   Psychiatric/Behavioral: Negative.    Outside labs: Cmet normal with glucose 123, Hgb 8, Hct 27, WBC 5.5 MCV 71.7, Plt 417  CXR - right mastectomy, hiatal hernia  Physical Exam  T 97.6 122/72  HR 89 R- 12 at rest, O2 sat 97.6 Gen'l- obese white woman in no distress. Pale Neck - supple, w/o thyromegaly Nodes - negative neck Cor 2+ radial pulse, 1+ DP pulse, no JVD, no carotid bruit, RRR PUlm - normal respirations, Lungs CTAP Abd - BS+ x 4, obese, no guarding or rebound, very tender to deep palpation epigastrium Rectal deferred to unremarkable exam by report Nov 3rd Ext - swelling at both ankle, no deformities Neuro - A&O x 3 CN II-XII normal, normal gait. HEENT - oropharynx w/o lesions, dentition in good repair, throat clear, C&S clear, PERRLA Assessment/Plan 1. Anemia - patient is progressively symptomatic with SOB/DOE, heavy palpations and mild chest pressure  Plan Med/Surg observation  T&C 2 units pRBCs - transfuse when ready  Pre-transfusion lab: CBC, anemia panel  Post-transfusion - H/H at 4 hrs  2. GI - UGI blood loss is most probable with h/o peptic ulcer in the past, most recently '10, tenderness on exam  Plan GI consult  Clear liquids after MN for possible EGD 11/5.  Daisy Bryant 11/07/2011, 6:39 PM

## 2011-11-07 NOTE — Telephone Encounter (Signed)
Call patient - if willing can admit to hospital this evening. If they agree I need to know to call for a bed and they should come to the office late afternoon for a direct admission.

## 2011-11-08 ENCOUNTER — Encounter (HOSPITAL_COMMUNITY): Payer: Self-pay | Admitting: *Deleted

## 2011-11-08 ENCOUNTER — Ambulatory Visit: Payer: Medicare Other | Admitting: Internal Medicine

## 2011-11-08 DIAGNOSIS — K319 Disease of stomach and duodenum, unspecified: Secondary | ICD-10-CM

## 2011-11-08 DIAGNOSIS — R109 Unspecified abdominal pain: Secondary | ICD-10-CM

## 2011-11-08 DIAGNOSIS — D649 Anemia, unspecified: Secondary | ICD-10-CM

## 2011-11-08 DIAGNOSIS — D509 Iron deficiency anemia, unspecified: Secondary | ICD-10-CM

## 2011-11-08 LAB — IRON AND TIBC
Iron: <10 ug/dL — ABNORMAL LOW (ref 42–135)
UIBC: 513 ug/dL — ABNORMAL HIGH (ref 125–400)

## 2011-11-08 LAB — CBC
Hemoglobin: 9.3 g/dL — ABNORMAL LOW (ref 12.0–15.0)
MCH: 23.3 pg — ABNORMAL LOW (ref 26.0–34.0)
MCV: 75 fL — ABNORMAL LOW (ref 78.0–100.0)
RBC: 4 MIL/uL (ref 3.87–5.11)

## 2011-11-08 MED ORDER — PANTOPRAZOLE SODIUM 40 MG PO TBEC
40.0000 mg | DELAYED_RELEASE_TABLET | Freq: Two times a day (BID) | ORAL | Status: DC
  Administered 2011-11-08 – 2011-11-10 (×4): 40 mg via ORAL
  Filled 2011-11-08 (×6): qty 1

## 2011-11-08 MED ORDER — SENNOSIDES-DOCUSATE SODIUM 8.6-50 MG PO TABS
2.0000 | ORAL_TABLET | Freq: Every day | ORAL | Status: DC
  Administered 2011-11-09: 2 via ORAL
  Filled 2011-11-08 (×3): qty 2

## 2011-11-08 MED ORDER — SODIUM CHLORIDE 0.9 % IV SOLN
1300.0000 mg | Freq: Once | INTRAVENOUS | Status: AC
  Administered 2011-11-08: 1300 mg via INTRAVENOUS
  Filled 2011-11-08: qty 26

## 2011-11-08 MED ORDER — SODIUM CHLORIDE 0.9 % IV SOLN
25.0000 mg | Freq: Once | INTRAVENOUS | Status: AC
  Administered 2011-11-08: 25 mg via INTRAVENOUS
  Filled 2011-11-08: qty 0.5

## 2011-11-08 MED ORDER — POLYSACCHARIDE IRON COMPLEX 150 MG PO CAPS
150.0000 mg | ORAL_CAPSULE | Freq: Every day | ORAL | Status: DC
  Administered 2011-11-08 – 2011-11-10 (×2): 150 mg via ORAL
  Filled 2011-11-08 (×3): qty 1

## 2011-11-08 NOTE — Consult Note (Signed)
   Chart was reviewed and patient was examined. X-rays were reviewed.   Pt has chronic GI blood loss.  In view of relatively recent colonoscopy doubt LGI neoplasm.  Active PUD is less likely in the presence of PPI therapy and absence of NSAID use.  AVMs anywhere along the GI tract may be a source for bleeding.  Small bowel neoplasm (usually benign) is also a possibility.  Recommend EGD/enteroscopy.  If negative will schedule a capsule endoscopy as an outpatient.  Barbette Hair. Arlyce Dice, M.D., [facility] Cell [phone]

## 2011-11-08 NOTE — Progress Notes (Signed)
Subjective: Received 2 units PRBCs, no events. F/U H/H  @ 1000 hrs. She feels much better: headache is resolved, no palpations or chest pressure.   Objective: Lab: Lab Results  Component Value Date   WBC 6.5 11/03/2009   HGB 7.3* 11/07/2011   HCT 24.6* 11/07/2011   MCV 86.7 11/03/2009   PLT 322.0 11/03/2009   BMET    Component Value Date/Time   NA 138 11/07/2011 2010   K 3.8 11/07/2011 2010   CL 101 11/07/2011 2010   CO2 27 11/07/2011 2010   GLUCOSE 111* 11/07/2011 2010   BUN 15 11/07/2011 2010   CREATININE 1.13* 11/07/2011 2010   CALCIUM 8.9 11/07/2011 2010   GFRNONAA 45* 11/07/2011 2010   GFRAA 52* 11/07/2011 2010   Fe <10  Imaging:  Scheduled Meds:   . amitriptyline  50 mg Oral QHS  . citalopram  20 mg Oral Daily  . [COMPLETED] furosemide  20 mg Intravenous Once  . hydrochlorothiazide  25 mg Oral Daily  . levothyroxine  50 mcg Oral QAC breakfast  . pantoprazole  40 mg Oral Daily   Continuous Infusions:   . sodium chloride 50 mL/hr at 11/08/11 0544   PRN Meds:.acetaminophen, acetaminophen   Physical Exam: Filed Vitals:   11/08/11 0532  BP: 146/81  Pulse: 65  Temp: 97.8 F (36.6 C)  Resp: 18   Gen'l - overweight white woman who has much better color this AM Cor- RRR Pulm - normal respirations Abd - BS+, tender at epigastrium     Assessment/Plan: 1. GI - blood loss anemia - high risk for recurrent PUD with bleed.   Plan H/H @ 1000, will repeat at 1800 hrs  GI consult called. - possible EGD today or tomorrow - will make NPO  Continue PPI therapy   Michael Norins [facility] (o) [phone]; (c) [phone] Call-grp - [facility] Tele: [phone]  11/08/2011, 7:41 AM    

## 2011-11-08 NOTE — Progress Notes (Signed)
MEDICATION RELATED CONSULT NOTE - INITIAL   Pharmacy Consult for IV Iron Indication: Severe iron deficiency anemia  Allergies  Allergen Reactions  . Esomeprazole Magnesium     REACTION: causes diarrhea  . Iodine     REACTION: itching    Patient Measurements: Height: 5' 7.5" (171.5 cm) Weight: 225 lb 8 oz (102.286 kg) IBW/kg (Calculated) : 62.75   Vital Signs: Temp: 97.8 F (36.6 C) (11/05 0532) Temp src: Oral (11/05 0532) BP: 146/81 mmHg (11/05 0532) Pulse Rate: 65  (11/05 0532) Intake/Output from previous day: 11/04 0701 - 11/05 0700 In: 890 [I.V.:190; Blood:700] Out: -  Intake/Output from this shift:    Labs:  Basename 11/07/11 2010  WBC --  HGB 7.3*  HCT 24.6*  PLT --  APTT --  CREATININE 1.13*  LABCREA --  CREATININE 1.13*  CREAT24HRUR --  MG --  PHOS --  ALBUMIN --  PROT --  ALBUMIN --  AST --  ALT --  ALKPHOS --  BILITOT --  BILIDIR --  IBILI --   Estimated Creatinine Clearance: 50.1 ml/min (by C-G formula based on Cr of 1.13).  Medical History: Past Medical History  Diagnosis Date  . Hypertension   . Hypothyroidism   . Depression   . Anxiety   . Tuberculosis   . GERD (gastroesophageal reflux disease)   . H/O hiatal hernia   . Neuromuscular disorder   . Cancer   . Arthritis   . Anemia    Assessment:  20 yof with h/o UGI bleed in 2010 now with progress decline in energy found with Hgb of 8 @ Urgent Care.  Patient admitted for observation, received 2 units of PRBCs. GI consulted. Anemia panel on 11/4 with Iron < 10, Ferritin 5.  MD ordered IV Iron dosing per pharmacy for severe iron deficiency anemia.  Patient wts 102.3kg, last Hgb 7.3 (prior to 2 units of PRBCs).  No specific goal Hgb by MD, pharmacy will aim for goal of 13.   Plan:   Iron Dextran 25 mg as test dose.  RN will be informed to observe patient for any potential reaction within 1 hour.  If pt tolerates, pharmacy will be informed to enter a dose of Iron Dextran 1300 mg x  1 to be given over 4-6 hours.  Per protocol, patient will be receiving oral iron, will give Nu-Iron 150 mg po daily and Senna-S 2 tablets qhs (hold for diarrhea).  Geoffry Paradise, PharmD, BCPS Pager: [phone] 8:22 AM Pharmacy #: [phone]

## 2011-11-08 NOTE — Consult Note (Signed)
Referring Provider: No ref. provider found Primary Care Physician:  Illene Regulus, MD Primary Gastroenterologist:  Dr. Russella Dar  Reason for Consultation:  Anemia; history of PUD  HPI: Daisy Bryant is a 78 y.o. female who presented to Dr. Debby Bud office yesterday after being seen at an urgent care and found to have a Hgb of 8 grams.  She went to the urgent care due to symptoms of SOB and worsening fatigue, particularly on exertion, for the past month.  Dr. Debby Bud recommended that she be admitted to the hospital for further evaluation and treatment.  Here her Hgb was 7.3 grams.  Hgb two years ago was 10.8 grams.  Was given two units of PRBC's and Hgb increased to 9.3 grams.  Vit B12 level was normal and folate is pending.  Iron levels are extremely low, therefore she was given a dose of IV iron and started on oral iron daily.  Is on BID PPI here.  She does take prilosec daily at home and DOES NOT take any NSAID's due to her history of GIB in the past.  In 12/1999 she was hospitalized for GIB; she had a gastric ulcer from NSAID use at that time.  Denies seeing dark or bloody stools at home.  No nausea, vomiting, bowel changes, abdominal pain, decreased appetite or weight loss.  Last EGD in 04/2008 she had friable mucosa in the antrum and a 6 mm ulcer in the antrum; biopsies for Hpylori were negative at that time.  Had colonoscopy in 2010 as well with diverticulosis and one hyperplastic colonic polyp.   Past Medical History  Diagnosis Date  . Hypertension   . Hypothyroidism   . Depression   . Anxiety   . Tuberculosis   . GERD (gastroesophageal reflux disease)   . H/O hiatal hernia   . Neuromuscular disorder   . Cancer   . Arthritis   . Anemia     Past Surgical History  Procedure Date  . Joint replacement   . Mastectomy   . Breast surgery   . Eye surgery     Prior to Admission medications   Medication Sig Start Date End Date Taking? Authorizing Provider  amitriptyline (ELAVIL) 25 MG  tablet Take 50 mg by mouth at bedtime.   Yes Historical Provider, MD  citalopram (CELEXA) 20 MG tablet TAKE 1 TABLET DAILY 10/01/11  Yes Jacques Navy, MD  conjugated estrogens (PREMARIN) vaginal cream Place vaginally daily. 1 vaginal application daily for 10 days then twice a week on an on-going basis. 08/04/11 08/03/12 Yes Jacques Navy, MD  conjugated estrogens (PREMARIN) vaginal cream Place 1 g vaginally once a week. Thursday.   Yes Historical Provider, MD  hydrochlorothiazide (HYDRODIURIL) 25 MG tablet Take 25 mg by mouth daily.   Yes Historical Provider, MD  levothyroxine (SYNTHROID, LEVOTHROID) 50 MCG tablet Take 50 mcg by mouth daily.   Yes Historical Provider, MD  omeprazole (PRILOSEC) 20 MG capsule Take 40 mg by mouth every morning.   Yes Historical Provider, MD  simvastatin (ZOCOR) 20 MG tablet Take 20 mg by mouth every evening.   Yes Historical Provider, MD    Current Facility-Administered Medications  Medication Dose Route Frequency Provider Last Rate Last Dose  . 0.9 %  sodium chloride infusion   Intravenous Continuous Jacques Navy, MD 50 mL/hr at 11/08/11 0544    . acetaminophen (TYLENOL) tablet 650 mg  650 mg Oral Q6H PRN Jacques Navy, MD       Or  .  acetaminophen (TYLENOL) suppository 650 mg  650 mg Rectal Q6H PRN Jacques Navy, MD      . amitriptyline (ELAVIL) tablet 50 mg  50 mg Oral QHS Jacques Navy, MD   50 mg at 11/07/11 2303  . citalopram (CELEXA) tablet 20 mg  20 mg Oral Daily Jacques Navy, MD   20 mg at 11/08/11 0941  . [COMPLETED] furosemide (LASIX) injection 20 mg  20 mg Intravenous Once Jacques Navy, MD   20 mg at 11/07/11 2301  . hydrochlorothiazide (HYDRODIURIL) tablet 25 mg  25 mg Oral Daily Jacques Navy, MD   25 mg at 11/08/11 0942  . iron dextran complex (INFED) 25 mg in sodium chloride 0.9 % 50 mL IVPB  25 mg Intravenous Once Thuyvan Thi Phan, PHARMD      . iron polysaccharides (NIFEREX) capsule 150 mg  150 mg Oral Daily Thuyvan Thi  Phan, PHARMD      . levothyroxine (SYNTHROID, LEVOTHROID) tablet 50 mcg  50 mcg Oral QAC breakfast Jacques Navy, MD   50 mcg at 11/08/11 0942  . pantoprazole (PROTONIX) EC tablet 40 mg  40 mg Oral BID WC Jacques Navy, MD   40 mg at 11/08/11 0941  . senna-docusate (Senokot-S) tablet 2 tablet  2 tablet Oral QHS Thuyvan Thi Phan, PHARMD      . [DISCONTINUED] pantoprazole (PROTONIX) EC tablet 40 mg  40 mg Oral Daily Jacques Navy, MD   40 mg at 11/07/11 2026    Allergies as of 11/07/2011 - Review Complete 11/07/2011  Allergen Reaction Noted  . Esomeprazole magnesium    . Iodine      No family history on file.  History   Social History  . Marital Status: Widowed    Spouse Name: N/A    Number of Children: 4  . Years of Education: 17   Occupational History  . counseling -retired    Social History Main Topics  . Smoking status: Never Smoker   . Smokeless tobacco: Never Used  . Alcohol Use: 0.5 oz/week    1 drink(s) per week     Comment: wine  . Drug Use: No  . Sexually Active: No   Other Topics Concern  . 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 Married '55 - 2 years/divorced, married '65 - 2007/widowed. 2 sons - '55, '69; 2 dtr - '56, '68; 3 grandchildren. LIves alone and is independent all ADLs.  ACP- no CPR,  OK for short term mechanical ventilation, no heroic or futile measure in the face of irreversible disease.     Review of Systems: Ten point ROS is O/W negative except as mentioned in HPI.  Physical Exam: Vital signs in last 24 hours: Temp:  [97.8 F (36.6 C)-98.2 F (36.8 C)] 97.8 F (36.6 C) (11/05 0532) Pulse Rate:  [64-88] 65  (11/05 0532) Resp:  [18-20] 18  (11/05 0532) BP: (112-157)/(71-89) 146/81 mmHg (11/05 0532) SpO2:  [96 %-98 %] 98 % (11/05 0532) Weight:  [225 lb 8 oz (102.286 kg)] 225 lb 8 oz (102.286 kg) (11/04 1909)   General:   Alert, Well-developed,  well-nourished, pleasant and cooperative in NAD. Head:  Normocephalic and atraumatic. Eyes:  Sclera clear, no icterus.  Conjunctiva pink. Ears:  Normal auditory acuity. Mouth:  No deformity or lesions.   Lungs:  Clear throughout to auscultation.  No wheezes, crackles,  or rhonchi. Heart:  Regular rate and rhythm; no murmurs, clicks, rubs,  or gallops. Abdomen:  Soft, non-distended, BS active.  TTP in the epigastrium with deep palpation only. Rectal:  Deferred  Msk:  Symmetrical without gross deformities. Pulses:  Normal pulses noted. Extremities:  Without clubbing or edema. Neurologic:  Alert and  oriented x4;  grossly normal neurologically. Skin:  Intact without significant lesions or rashes. Psych:  Alert and cooperative. Normal mood and affect.  Intake/Output from previous day: 11/04 0701 - 11/05 0700 In: 890 [I.V.:190; Blood:700] Out: -   Lab Results:  Basename 11/08/11 0908 11/07/11 2010  WBC 3.9* --  HGB 9.3* 7.3*  HCT 30.0* 24.6*  PLT 339 --   BMET  Basename 11/07/11 2010  NA 138  K 3.8  CL 101  CO2 27  GLUCOSE 111*  BUN 15  CREATININE 1.13*  CALCIUM 8.9    IMPRESSION:  -Acute on chronic IDA:  Rule out GI blood loss.  Hgb stable s/p 2 units of PRBC's. -History of GIB from PUD.  PLAN: -Monitor Hgb and transfuse prn. -Will plan for EGD with enteroscopy 11/6. -Continue PPI. -Agree with iron supplementations.  ZEHR, JESSICA D.  11/08/2011, 10:17 AM  Pager number [phone]

## 2011-11-09 ENCOUNTER — Encounter (HOSPITAL_COMMUNITY): Payer: Self-pay

## 2011-11-09 ENCOUNTER — Encounter (HOSPITAL_COMMUNITY)
Admission: AD | Disposition: A | Discharge status: Home / Self Care | Payer: Self-pay | Source: Ambulatory Visit | Attending: Internal Medicine

## 2011-11-09 DIAGNOSIS — K449 Diaphragmatic hernia without obstruction or gangrene: Secondary | ICD-10-CM

## 2011-11-09 HISTORY — PX: ENTEROSCOPY: SHX5533

## 2011-11-09 LAB — HEMOGLOBIN AND HEMATOCRIT, BLOOD
HCT: 34.4 % — ABNORMAL LOW (ref 36.0–46.0)
Hemoglobin: 10.7 g/dL — ABNORMAL LOW (ref 12.0–15.0)

## 2011-11-09 LAB — TYPE AND SCREEN
ABO/RH(D): O POS
Unit division: 0

## 2011-11-09 LAB — FOLATE: Folate: 18.7 ng/mL (ref >5.4)

## 2011-11-09 SURGERY — ENTEROSCOPY
Anesthesia: Moderate Sedation

## 2011-11-09 MED ORDER — BUTAMBEN-TETRACAINE-BENZOCAINE 2-2-14 % EX AERO
INHALATION_SPRAY | CUTANEOUS | Status: DC | PRN
  Administered 2011-11-09: 2 via TOPICAL

## 2011-11-09 MED ORDER — MIDAZOLAM HCL 10 MG/2ML IJ SOLN
INTRAMUSCULAR | Status: DC | PRN
  Administered 2011-11-09 (×2): 2 mg via INTRAVENOUS
  Administered 2011-11-09: 1 mg via INTRAVENOUS

## 2011-11-09 MED ORDER — SODIUM CHLORIDE 0.9 % IV SOLN: INTRAVENOUS | Status: DC

## 2011-11-09 MED ORDER — FENTANYL CITRATE 0.05 MG/ML IJ SOLN
INTRAMUSCULAR | Status: DC | PRN
  Administered 2011-11-09 (×3): 25 ug via INTRAVENOUS

## 2011-11-09 NOTE — Progress Notes (Signed)
Upper endoscopy/enteroscopy was remarkable for a very large hilar hernia encompassing all but the distal stomach. Although no erosions were seen she certainly could have Cameron erosions.  I have ordered an upper GI to rule out a paraesophageal hiatal hernia.

## 2011-11-09 NOTE — Progress Notes (Signed)
Received from Endo via wheelchair, alert and oriented without complaints of pain.  Vital signs 157/85, 98-68-18, 93%.  Assisted ordering regular meal.  Iv in progress NS at 50ml hour left forearm.

## 2011-11-09 NOTE — Progress Notes (Signed)
No change overnight. Hgb last PM 9.8g.  \For EGD/enteroscopy this AM  Hopefully home this PM

## 2011-11-09 NOTE — H&P (View-Only) (Signed)
Subjective: Received 2 units PRBCs, no events. F/U H/H  @ 1000 hrs. She feels much better: headache is resolved, no palpations or chest pressure.   Objective: Lab: Lab Results  Component Value Date   WBC 6.5 11/03/2009   HGB 7.3* 11/07/2011   HCT 24.6* 11/07/2011   MCV 86.7 11/03/2009   PLT 322.0 11/03/2009   BMET    Component Value Date/Time   NA 138 11/07/2011 2010   K 3.8 11/07/2011 2010   CL 101 11/07/2011 2010   CO2 27 11/07/2011 2010   GLUCOSE 111* 11/07/2011 2010   BUN 15 11/07/2011 2010   CREATININE 1.13* 11/07/2011 2010   CALCIUM 8.9 11/07/2011 2010   GFRNONAA 45* 11/07/2011 2010   GFRAA 52* 11/07/2011 2010   Fe <10  Imaging:  Scheduled Meds:   . amitriptyline  50 mg Oral QHS  . citalopram  20 mg Oral Daily  . [COMPLETED] furosemide  20 mg Intravenous Once  . hydrochlorothiazide  25 mg Oral Daily  . levothyroxine  50 mcg Oral QAC breakfast  . pantoprazole  40 mg Oral Daily   Continuous Infusions:   . sodium chloride 50 mL/hr at 11/08/11 0544   PRN Meds:.acetaminophen, acetaminophen   Physical Exam: Filed Vitals:   11/08/11 0532  BP: 146/81  Pulse: 65  Temp: 97.8 F (36.6 C)  Resp: 18   Gen'l - overweight white woman who has much better color this AM Cor- RRR Pulm - normal respirations Abd - BS+, tender at epigastrium     Assessment/Plan: 1. GI - blood loss anemia - high risk for recurrent PUD with bleed.   Plan H/H @ 1000, will repeat at 1800 hrs  GI consult called. - possible EGD today or tomorrow - will make NPO  Continue PPI therapy   Illene Regulus [place] [facility] (o) [phone]; (c) [phone] Call-grp - Patsi Sears IM Tele: [phone]  11/08/2011, 7:41 AM

## 2011-11-09 NOTE — Interval H&P Note (Signed)
History and Physical Interval Note:  11/09/2011 11:56 AM  Daisy Bryant  has presented today for surgery, with the diagnosis of GIB; rule out ulcer, AVM, etc.  The various methods of treatment have been discussed with the patient and family. After consideration of risks, benefits and other options for treatment, the patient has consented to  Procedure(s) (LRB) with comments: ENTEROSCOPY (N/A) as a surgical intervention .  The patient's history has been reviewed, patient examined, no change in status, stable for surgery.  I have reviewed the patient's chart and labs.  Questions were answered to the patient's satisfaction.    The recent H&P (dated **11/08/11*) was reviewed, the patient was examined and there is no change in the patients condition since that H&P was completed.   [name]  11/09/2011, 11:56 AM    [name]

## 2011-11-09 NOTE — Progress Notes (Signed)
No change overnight.  Hgb is stable at 9.8 grams.  She is to have EGD/enteroscopy today with potential D/C this PM.

## 2011-11-09 NOTE — Progress Notes (Signed)
Pt transported to endo via w/c. dph

## 2011-11-09 NOTE — Op Note (Signed)
[facility] [address] [place], [address]   OPERATIVE PROCEDURE REPORT  PATIENT: Daisy Bryant, Daisy Bryant  MR#: [id number] BIRTHDATE: [birth year] , 81  yrs. old GENDER: Female ENDOSCOPIST: Louis Meckel, MD REFERRED BY: PROCEDURE DATE: 11/09/2011 PROCEDURE:   Diagnostic small bowel enteroscopy ASA CLASS:   Class II INDICATIONS: MEDICATIONS: These medications were titrated to patient response per physician's verbal order, Versed 5 mg IV, and Fentanyl 75 mcg IV  TOPICAL ANESTHETIC:   Cetacaine Spray  DESCRIPTION OF PROCEDURE:   After the risks benefits and alternatives of the procedure were thoroughly explained, informed consent was obtained.  The Pentax EC-[id number] (s/n [id number]) endoscope was introduced through the mouth  and advanced to the proximal jejunum  , limited by Without limitations.   The instrument was slowly withdrawn as the mucosa was fully examined.    A large hiatal hernia was found   The vast majority of the stomach was in the chest with only a small portion of the distal antrum below the diaphragm.The remainder of the small bowel enteroscopy exam was otherwise normal.   A early stricture was found in the gastroesophageal junction.   Retroflexed views revealed no abnormalities.    The scope was then withdrawn from the patient and the procedure terminated.  COMPLICATIONS: There were no complications. ENDOSCOPIC IMPRESSION: 1.   Large hiatal hernia encompassing almost the entire stomach 2.  early esophageal stricture   RECOMMENDATIONS: upper GI series REPEAT EXAM:  _______________________________ eSignedLouis Meckel, MD 11/09/2011 12:21 PM   CC:

## 2011-11-10 ENCOUNTER — Encounter (HOSPITAL_COMMUNITY): Payer: Self-pay

## 2011-11-10 ENCOUNTER — Observation Stay (HOSPITAL_COMMUNITY): Payer: Medicare Other

## 2011-11-10 ENCOUNTER — Encounter (HOSPITAL_COMMUNITY): Payer: Self-pay | Admitting: Gastroenterology

## 2011-11-10 ENCOUNTER — Encounter: Payer: Medicare Other | Admitting: Internal Medicine

## 2011-11-10 DIAGNOSIS — I10 Essential (primary) hypertension: Secondary | ICD-10-CM

## 2011-11-10 MED ORDER — OMEPRAZOLE 40 MG PO CPDR: 40.0000 mg | DELAYED_RELEASE_CAPSULE | Freq: Two times a day (BID) | ORAL | Status: DC

## 2011-11-10 MED ORDER — POLYSACCHARIDE IRON COMPLEX 150 MG PO CAPS: 150.0000 mg | ORAL_CAPSULE | Freq: Every day | ORAL | Status: DC

## 2011-11-10 NOTE — Progress Notes (Signed)
[facility] Progress Note  Subjective:  Feels fine.  Waiting to go to UGI.  Hoping to go home today.  Objective:  Vital signs in last 24 hours: Temp:  [97.5 F (36.4 C)-98.1 F (36.7 C)] 97.8 F (36.6 C) (11/07 0825) Pulse Rate:  [63-87] 76  (11/07 0829) Resp:  [14-19] 18  (11/07 0825) BP: (135-198)/(81-115) 148/87 mmHg (11/07 0829) SpO2:  [91 %-99 %] 97 % (11/07 0827) Last BM Date: 11/10/11 General:   Alert, Well-developed, in NAD Heart:  Regular rate and rhythm; no murmurs Pulm:  CTAB.  No W/R/R. Abdomen:  Soft, nontender and nondistended. Normal bowel sounds, without guarding, and without rebound.   Extremities:  Without edema. Neurologic:  Alert and  oriented x4;  grossly normal neurologically. Psych:  Alert and cooperative. Normal mood and affect.  Intake/Output from previous day: 11/06 0701 - 11/07 0700 In: 1050.8 [I.V.:1050.8] Out: -   Lab Results:  Basename 11/09/11 1828 11/08/11 1819 11/08/11 0908  WBC -- -- 3.9*  HGB 10.7* 9.8* 9.3*  HCT 34.4* 31.5* 30.0*  PLT -- -- 339   BMET  Basename 11/07/11 2010  NA 138  K 3.8  CL 101  CO2 27  GLUCOSE 111*  BUN 15  CREATININE 1.13*  CALCIUM 8.9    Assessment / Plan: -Acute on chronic IDA: Hgb stable and continuing to improve s/p 2 units of PRBC's.  -Large hiatal hernia seem on EGD.  No erosions seen, but could have Cameron lesions from the hernia.  -Monitor Hgb. -Follow up UGI that was ordered to rule out paraesophageal hiatal hernia. -Continue PPI.  -Agree with iron supplementations. -Likely D/C today.    LOS: 3 days   Olivette Beckmann D.  11/10/2011, 9:28 AM  Pager number [phone]

## 2011-11-10 NOTE — Progress Notes (Signed)
Pt and daughter given discharge instructions, medication information, and information about the follow-up appt. Pt and daughter verbalized understanding of all instructions and information. Pt did not have any questions or concerns. Pt did not want MYCHART set up at this time. Orson Ape D 11/10/2011

## 2011-11-10 NOTE — Progress Notes (Signed)
Subjective: Feels ok, ready for d/c  Objective: Lab: Lab Results  Component Value Date   WBC 3.9* 11/08/2011   HGB 10.7* 11/09/2011   HCT 34.4* 11/09/2011   MCV 75.0* 11/08/2011   PLT 339 11/08/2011   BMET    Component Value Date/Time   NA 138 11/07/2011 2010   K 3.8 11/07/2011 2010   CL 101 11/07/2011 2010   CO2 27 11/07/2011 2010   GLUCOSE 111* 11/07/2011 2010   BUN 15 11/07/2011 2010   CREATININE 1.13* 11/07/2011 2010   CALCIUM 8.9 11/07/2011 2010   GFRNONAA 45* 11/07/2011 2010   GFRAA 52* 11/07/2011 2010     Imaging:  Scheduled Meds:   . amitriptyline  50 mg Oral QHS  . citalopram  20 mg Oral Daily  . hydrochlorothiazide  25 mg Oral Daily  . iron polysaccharides  150 mg Oral Daily  . levothyroxine  50 mcg Oral QAC breakfast  . pantoprazole  40 mg Oral BID WC  . senna-docusate  2 tablet Oral QHS   Continuous Infusions:   . sodium chloride 50 mL/hr at 11/09/11 0703  . sodium chloride     PRN Meds:.acetaminophen, acetaminophen   Physical Exam: Filed Vitals:   11/10/11 0829  BP: 148/87  Pulse: 76  Temp:   Resp:    D     Assessment/Plan: oing well. Hgb stable. UGI done - radiologist reading pending.  Dictated #[id number]  Ready for d/c   Illene Regulus  IM (o) [phone]; (c) [phone] Call-grp - Patsi Sears IM Tele: [phone]  11/10/2011, 12:28 PM

## 2011-11-11 NOTE — Progress Notes (Signed)
I have personally taken an interval history, reviewed the chart, and examined the patient.  I agree with the extender's note, impression and recommendations.  Christyana Corwin D. Tane Biegler, MD, FACG [facility] [phone]  

## 2011-11-11 NOTE — Discharge Summary (Signed)
NAMEANGILA, [name]:  [id number]  MEDICAL RECORD NO.:  [id number]  LOCATION:  1515                         FACILITY:  [facility]  PHYSICIAN:  Daisy Gess. Chava Dulac, MD  DATE OF BIRTH:  03-15-1930  DATE OF ADMISSION:  11/07/2011 DATE OF DISCHARGE:  11/10/2011                              DISCHARGE SUMMARY   ADMITTING DIAGNOSES: 1. Iron-deficiency anemia. 2. Massive hiatal hernia.  DISCHARGE DIAGNOSES: 1. Iron-deficiency anemia. 2. Massive hiatal hernia.  CONSULTANTS:  Daisy Hair. Arlyce Dice, MD,FACG from Gastroenterology.  TEST RESULTS:  The patient had upper GI study done on the day of discharge, result is pending.  The patient does have a very large hiatal hernia with stomach occupying much of the chest.  The patient did have upper endoscopy performed November 09, 2011, which showed the patient to have a large hiatal hernia encompassing almost the entire stomach.  She had early esophageal stricture.  No bleeding source was identified.  HISTORY OF PRESENT ILLNESS:  Daisy Bryant is a 78 year old Caucasian woman with a history of known hiatal hernia and known dyspepsia.  She had been taking omeprazole on a daily basis.  For several weeks, she has noticed a drop in her level of energy.  Thursday prior to admission, she was in the [place] area and became very weak and short of breath with minimal activities.  She was seen at the [facility] on Sunday November 06, 2011, where she was found to have a hemoglobin of 8 g, but heme-negative stool by clinic physician exam. She declined hospitalization so she could return home.  She continued to be short of breath with minimal activity, and had strong palpitations with chest pressure.  She was subsequently admitted.  Please see the H and P for past medical history, family history, social history, and admission exam.  HOSPITAL COURSE: 1. Anemia.  The patient was found to have a hemoglobin of  7.3 g at the     time of admission.  She was typed, cross-matched, and transfused 2     units of blood.  After this treatment, she did have a stable     hemoglobin going to 9.3 g and holding stable.  So at the time of     discharge, her hemoglobin was actually 10.7 g.  The patient also     was found to be significantly iron deficient with an iron     saturation not calculated since her total iron was less than 10.     She was given IV iron during this hospitalization. 2. The patient did well.  She is able to take a diet.  She tolerated     her upper GI series without difficulty.  She has had no sign or     evidence of ongoing bleeding.  She has had no difficulty with     swallowing or regurgitation. 3. With the patient being hemodynamically stable with her hemoglobin     being stable at 10.7 g at this time she is ready for discharge to     home with close outpatient followup.  The patient has otherwise     remained  medically stable.  DISCHARGE PHYSICAL EXAMINATION:  VITAL SIGNS:  Temperature was 97.8, blood pressure 148/87, pulse 76, respirations 18, O2 sats 97% on room air. GENERAL APPEARANCE:  This is an overweight Caucasian woman looking younger than her stated chronologic age.  She is in no acute distress. HEENT:  Conjunctivae and sclerae were clear without icterus. NECK:  Supple. CHEST:  The patient has no deformities. LUNGS:  The patient is moving air well with no rales, wheezes, or rhonchi. CARDIOVASCULAR:  2+ radial pulse.  Her precordium is quiet.  Her heart rate is regular.  No murmurs were appreciated. ABDOMEN:  Obese.  She had positive bowel sounds.  No guarding or rebound.  No tenderness. NEUROLOGIC:  The patient is awake, alert.  She is oriented to person, place, time, and context.  FINAL LABORATORY:  Hemoglobin on November 09, 2011, at 1828 hours was 10.7 g with a hematocrit of 34.4%.  Last white count was low normal at 3.9, platelet count was normal at 339.  Final  chemistries from November 07, 2011, with a sodium of 138, potassium 3.8, chloride 101, CO2 of 27, BUN of 15, creatinine 1.13, glucose was 111.  DISPOSITION:  The patient is discharged to home.  She will continue on all of her home medications, except she will increase her omeprazole to twice daily.  The patient will take an over-the-counter iron supplement.  The patient will be seen in the office in 5-7 days at which time, she will also have followup laboratory, hemoglobin and hematocrit.  The patient was see the GI Service as instructed.  The patient's condition at the time of discharge dictation is hemodynamically stable.     Daisy Gess Kollin Udell, MD     MEN/MEDQ  D:  11/10/2011  T:  11/11/2011  Job:  [id number]  cc:   Daisy Hair. Arlyce Dice, MD,FACG [address] [place] [address]

## 2011-11-23 ENCOUNTER — Encounter: Payer: Self-pay | Admitting: Internal Medicine

## 2011-11-23 ENCOUNTER — Other Ambulatory Visit (INDEPENDENT_AMBULATORY_CARE_PROVIDER_SITE_OTHER): Payer: Medicare Other

## 2011-11-23 ENCOUNTER — Ambulatory Visit (INDEPENDENT_AMBULATORY_CARE_PROVIDER_SITE_OTHER): Payer: Medicare Other | Admitting: Internal Medicine

## 2011-11-23 VITALS — BP 122/68 | HR 82 | Temp 98.3°F | Resp 10 | Wt 224.1 lb

## 2011-11-23 DIAGNOSIS — D509 Iron deficiency anemia, unspecified: Secondary | ICD-10-CM

## 2011-11-23 LAB — IBC PANEL: Saturation Ratios: 25.7 % (ref 20.0–50.0)

## 2011-11-23 MED ORDER — AMITRIPTYLINE HCL 25 MG PO TABS: 50.0000 mg | ORAL_TABLET | Freq: Every day | ORAL | Status: DC

## 2011-11-23 MED ORDER — POLYSACCHARIDE IRON COMPLEX 150 MG PO CAPS: 150.0000 mg | ORAL_CAPSULE | Freq: Every day | ORAL | Status: DC

## 2011-11-23 NOTE — Patient Instructions (Addendum)
Good to see you here and out of the hospital. It is good to know that you are happy with the care your received (phlebotomy excluded).  Today we'll check the hemoglobin and the iron level. No need for GI follow-up if the numbers are good.  You may resume your coffee.

## 2011-11-23 NOTE — Progress Notes (Signed)
  Subjective:    Patient ID: Daisy Bryant, female    DOB: January 28, 1930, 78 y.o.   MRN: [id number]  HPI Daisy Bryant was recently hospitalized for a GI bleed-most likely from [place] erosions of the hiatal hernia - received 2 units of blood. She also received IV iron. She has been doing well since discharge.  PMH, FamHx and SocHx reviewed for any changes and relevance.  Current Outpatient Prescriptions on File Prior to Visit  Medication Sig Dispense Refill  . citalopram (CELEXA) 20 MG tablet TAKE 1 TABLET DAILY  90 tablet  1  . conjugated estrogens (PREMARIN) vaginal cream Place vaginally daily. 1 vaginal application daily for 10 days then twice a week on an on-going basis.  42.5 g  12  . hydrochlorothiazide (HYDRODIURIL) 25 MG tablet Take 25 mg by mouth daily.      Marland Kitchen levothyroxine (SYNTHROID, LEVOTHROID) 50 MCG tablet Take 50 mcg by mouth daily.      Marland Kitchen omeprazole (PRILOSEC) 40 MG capsule Take 1 capsule (40 mg total) by mouth 2 (two) times daily before lunch and supper.  90 capsule  3  . simvastatin (ZOCOR) 20 MG tablet Take 20 mg by mouth every evening.      . [DISCONTINUED] amitriptyline (ELAVIL) 25 MG tablet Take 50 mg by mouth at bedtime.      . [DISCONTINUED] iron polysaccharides (NIFEREX) 150 MG capsule Take 1 capsule (150 mg total) by mouth daily.  30 capsule  11      Review of Systems System review is negative for any constitutional, cardiac, pulmonary, GI or neuro symptoms or complaints other than as described in the HPI.     Objective:   Physical Exam .vtials Gen'l- WNWD white woman in no distress HEENT - C&S clear Cor- RRR PUlm - Normal respirations.       Assessment & Plan:

## 2011-11-24 ENCOUNTER — Other Ambulatory Visit: Payer: Self-pay | Admitting: *Deleted

## 2011-11-24 DIAGNOSIS — D509 Iron deficiency anemia, unspecified: Secondary | ICD-10-CM

## 2011-11-24 MED ORDER — POLYSACCHARIDE IRON COMPLEX 150 MG PO CAPS: 150.0000 mg | ORAL_CAPSULE | Freq: Every day | ORAL | Status: DC

## 2011-11-24 NOTE — Assessment & Plan Note (Signed)
Hospitalized NOv '13 - UGI bleed - possible [place] erosions in massive hiatal hernia. Hgb to 7.7, Iron <10: Transfused and given IV iron. Since discharge - doing well  Plan - f/u lab  Addendum -  Lab Results  Component Value Date   HGB 11.6* 11/23/2011   Iron 115  Plan - continue oral iron.

## 2012-02-06 ENCOUNTER — Other Ambulatory Visit: Payer: Self-pay | Admitting: Internal Medicine

## 2012-02-08 ENCOUNTER — Other Ambulatory Visit: Payer: Self-pay | Admitting: Internal Medicine

## 2012-02-14 ENCOUNTER — Other Ambulatory Visit: Payer: Self-pay | Admitting: *Deleted

## 2012-02-14 DIAGNOSIS — D509 Iron deficiency anemia, unspecified: Secondary | ICD-10-CM

## 2012-02-14 MED ORDER — AMITRIPTYLINE HCL 25 MG PO TABS: 50.0000 mg | ORAL_TABLET | Freq: Every day | ORAL | Status: DC

## 2012-02-14 MED ORDER — HYDROCHLOROTHIAZIDE 25 MG PO TABS: 25.0000 mg | ORAL_TABLET | Freq: Every day | ORAL | Status: DC

## 2012-02-14 NOTE — Telephone Encounter (Signed)
Left msg on triage stating pt is wanting refill on her amitriptyline & HCTZ...Raechel Chute

## 2012-03-08 ENCOUNTER — Other Ambulatory Visit: Payer: Self-pay | Admitting: Internal Medicine

## 2012-03-31 ENCOUNTER — Other Ambulatory Visit: Payer: Self-pay | Admitting: Internal Medicine

## 2012-05-27 ENCOUNTER — Other Ambulatory Visit: Payer: Self-pay | Admitting: Internal Medicine

## 2012-06-12 ENCOUNTER — Ambulatory Visit (INDEPENDENT_AMBULATORY_CARE_PROVIDER_SITE_OTHER): Payer: Medicare PPO | Admitting: Internal Medicine

## 2012-06-12 ENCOUNTER — Encounter: Payer: Self-pay | Admitting: Internal Medicine

## 2012-06-12 VITALS — BP 140/80 | HR 63 | Temp 97.8°F | Resp 12 | Ht 67.5 in | Wt 231.0 lb

## 2012-06-12 DIAGNOSIS — D509 Iron deficiency anemia, unspecified: Secondary | ICD-10-CM

## 2012-06-12 NOTE — Progress Notes (Signed)
Subjective:    Patient ID: Daisy Bryant, female    DOB: December 14, 1930, 79 y.o.   MRN: [id number]  HPI Mrs. Calandra reports that she has gradually been getting weaker and sluggish, low on energy. Her children has told her she was pale. She has a history of blood loss in the past with hospitalization November '13 with 3 units of blood. EGD at that time revealed massive hiatal hernia but no frank bleeding source. She was also given iron. Her symptoms are reminiscent of previous episode of anemia. She does admit to DOE.  Past Medical History  Diagnosis Date  . Hypertension   . Hypothyroidism   . Depression   . Anxiety   . GERD (gastroesophageal reflux disease)   . H/O hiatal hernia   . Neuromuscular disorder   . Cancer   . Arthritis   . Anemia   . Tuberculosis    Past Surgical History  Procedure Laterality Date  . Joint replacement    . Mastectomy    . Breast surgery    . Eye surgery    . Enteroscopy  11/09/2011    Procedure: ENTEROSCOPY;  Surgeon: Louis Meckel, MD;  Location: [facility];  Service: Endoscopy;  Laterality: N/A;   History reviewed. No pertinent family history. History   Social History  . Marital Status: Widowed    Spouse Name: N/A    Number of Children: 4  . Years of Education: 17   Occupational History  . counseling -retired    Social History Main Topics  . Smoking status: Never Smoker   . Smokeless tobacco: Never Used  . Alcohol Use: 0.5 oz/week    1 drink(s) per week     Comment: wine  . Drug Use: No  . Sexually Active: No   Other Topics Concern  . 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 Married '55 - 2 years/divorced, married '65 - 2007/widowed. 2 sons - '55, '69; 2 dtr - '56, '68; 3 grandchildren. LIves alone and is independent all ADLs.  ACP- no CPR,  OK for short term mechanical ventilation, no heroic or futile measure in the face of irreversible disease.      Current Outpatient Prescriptions on File Prior to Visit  Medication Sig Dispense Refill  . amitriptyline (ELAVIL) 25 MG tablet Take 2 tablets (50 mg total) by mouth at bedtime.  60 tablet  5  . citalopram (CELEXA) 20 MG tablet TAKE 1 TABLET DAILY  90 tablet  0  . conjugated estrogens (PREMARIN) vaginal cream Place vaginally daily. 1 vaginal application daily for 10 days then twice a week on an on-going basis.  42.5 g  12  . levothyroxine (SYNTHROID, LEVOTHROID) 50 MCG tablet TAKE 1 TABLET EVERY DAY  30 tablet  2  . omeprazole (PRILOSEC) 40 MG capsule TAKE 1 CAPSULE BY MOUTH DAILY.  90 capsule  1  . simvastatin (ZOCOR) 20 MG tablet Take 20 mg by mouth every evening.       No current facility-administered medications on file prior to visit.      Review of Systems System review is negative for any constitutional, cardiac, pulmonary, GI or neuro symptoms or complaints other than as described in the HPI.     Objective:   Physical Exam Filed Vitals:   06/12/12 1658  BP: 140/80  Pulse: 63  Temp: 97.8 F (36.6 C)  Resp: 12  Wt Readings from Last 3 Encounters:  06/12/12 231 lb (104.781 kg)  11/23/11 224 lb 1.9 oz (101.66 kg)  11/07/11 225 lb 8 oz (102.286 kg)   Gen'l - overweight white woman in no distress but a bit pale HEENT- C&S clear, PERRLA Neck - supple, w/o thyromegaly Cor- 2+ radial, no JVD, no carotid bruits, RRR Pulm - good breath sounds Abd - obese, BS+ x 4, tender to deep palpation epigastrium       Assessment & Plan:

## 2012-06-12 NOTE — Assessment & Plan Note (Signed)
Daisy Bryant presents with symptoms that are very similar to her presentation last Nov '13. She is medically stable to go home tonight.  Plan Early AM STAT lab: H/H and IBC panel  For anemia will admit for observation : transfusion and IV iron if needed.

## 2012-06-13 ENCOUNTER — Other Ambulatory Visit (INDEPENDENT_AMBULATORY_CARE_PROVIDER_SITE_OTHER): Payer: Medicare PPO

## 2012-06-13 DIAGNOSIS — D509 Iron deficiency anemia, unspecified: Secondary | ICD-10-CM

## 2012-06-13 LAB — HEMOGLOBIN AND HEMATOCRIT, BLOOD: Hemoglobin: 13 g/dL (ref 12.0–15.0)

## 2012-06-13 LAB — IBC PANEL: Iron: 99 ug/dL (ref 42–145)

## 2012-06-18 ENCOUNTER — Ambulatory Visit (INDEPENDENT_AMBULATORY_CARE_PROVIDER_SITE_OTHER): Payer: Medicare PPO | Admitting: Internal Medicine

## 2012-06-18 ENCOUNTER — Encounter: Payer: Self-pay | Admitting: Internal Medicine

## 2012-06-18 VITALS — BP 124/76 | HR 75 | Temp 98.0°F | Resp 12 | Ht 67.5 in | Wt 232.8 lb

## 2012-06-18 DIAGNOSIS — F3289 Other specified depressive episodes: Secondary | ICD-10-CM

## 2012-06-18 DIAGNOSIS — D509 Iron deficiency anemia, unspecified: Secondary | ICD-10-CM

## 2012-06-18 DIAGNOSIS — F329 Major depressive disorder, single episode, unspecified: Secondary | ICD-10-CM

## 2012-06-18 NOTE — Progress Notes (Signed)
  Subjective:    Patient ID: [name], female    DOB: [birth year], 79 y.o.   MRN: [id number]  HPI Seen last week for progressive weakness and palor with a history of GI bleeds. Had Hgb checked - Hgb was normal at 13.0. Today she does admit that for several days she was under considerable emotional strain along with poor rest. Since being home she is feeling better. She has no focal/specific physical complaints.   Review of Systems     Objective:   Physical Exam        Assessment & Plan:

## 2012-06-19 NOTE — Assessment & Plan Note (Signed)
For weakness and pallor H/H checked 7 days ago - Hgb 13!! No evidence of recurrent anemia. Iron panel was also normal.

## 2012-06-19 NOTE — Assessment & Plan Note (Signed)
Her recent malaise and fatigue based on her report of recently family gathering for a wedding and the associated stress may be stress related  Plan If her symptoms persist after being home and in her own space for another couple of weeks will launch a more detailed evaluation

## 2012-08-10 ENCOUNTER — Other Ambulatory Visit: Payer: Self-pay | Admitting: Internal Medicine

## 2012-08-16 ENCOUNTER — Other Ambulatory Visit: Payer: Self-pay | Admitting: Internal Medicine

## 2012-08-28 ENCOUNTER — Ambulatory Visit: Payer: 59

## 2012-08-28 ENCOUNTER — Ambulatory Visit (INDEPENDENT_AMBULATORY_CARE_PROVIDER_SITE_OTHER): Payer: 59 | Admitting: Emergency Medicine

## 2012-08-28 VITALS — BP 126/70 | HR 89 | Temp 98.8°F | Resp 20 | Ht 64.5 in | Wt 230.0 lb

## 2012-08-28 DIAGNOSIS — R05 Cough: Secondary | ICD-10-CM

## 2012-08-28 DIAGNOSIS — J209 Acute bronchitis, unspecified: Secondary | ICD-10-CM

## 2012-08-28 LAB — POCT CBC
Lymph, poc: 1.7 (ref 0.6–3.4)
MCHC: 31.6 g/dL — AB (ref 31.8–35.4)
MPV: 9.2 fL (ref 0–99.8)
POC Granulocyte: 6.6 (ref 2–6.9)
POC LYMPH PERCENT: 18.6 %L (ref 10–50)
POC MID %: 10.4 %M (ref 0–12)
RDW, POC: 13.8 %

## 2012-08-28 MED ORDER — AZITHROMYCIN 250 MG PO TABS: ORAL_TABLET | ORAL | Status: DC

## 2012-08-28 MED ORDER — HYDROCOD POLST-CHLORPHEN POLST 10-8 MG/5ML PO LQCR: 5.0000 mL | Freq: Two times a day (BID) | ORAL | Status: DC | PRN

## 2012-08-28 NOTE — Patient Instructions (Signed)

## 2012-08-28 NOTE — Progress Notes (Signed)
[facility] [address] [place] [address] [phone]  Date:  08/28/2012   Name:  Daisy Bryant   DOB:  28-Mar-1930   MRN:  [id number]  PCP:  Illene Regulus, MD    Chief Complaint: URI and Fever   History of Present Illness:  Daisy Bryant is a 79 y.o. very pleasant female patient who presents with the following:  Ill with a cough Sunday afternoon.  Has spots of blood in her sputum.  Has some mucoid sputum.  Had a fever and increased shortness of breath.  Has chronic shortness of breath.  Fatigued.  No nausea or vomiting.  No stool change or coryza.  No further GI bleeding.  No improvement with over the counter medications or other home remedies. Denies other complaint or health concern today.   Patient Active Problem List   Diagnosis Date Noted  . Hiatal hernia 11/09/2011  . FLANK PAIN, RIGHT 08/21/2008  . ANEMIA, IRON DEFICIENCY 04/22/2008  . ABDOMINAL PAIN-LLQ 04/22/2008  . UNSPECIFIED ANEMIA 04/17/2008  . PUD 04/17/2008  . PEPTIC STRICTURE 04/17/2008  . SHELLFISH ALLERGY 04/17/2008  . CELIAC DISEASE 09/19/2007  . ADENOCARCINOMA, BREAST, RIGHT 03/13/2007  . HYPOTHYROIDISM 03/13/2007  . HYPERLIPIDEMIA 03/13/2007  . DEPRESSION 03/13/2007  . ATTENTION DEFICIT DISORDER 03/13/2007  . PERIPHERAL NEUROPATHY 03/13/2007  . HYPERTENSION 03/13/2007  . GASTROESOPHAGEAL REFLUX DISEASE 03/13/2007  . DIVERTICULOSIS, COLON 03/13/2007  . ROSACEA 03/13/2007  . DEGENERATIVE JOINT DISEASE 03/13/2007  . COUGH, CHRONIC 03/13/2007  . GASTROINTESTINAL HEMORRHAGE, HX OF 03/13/2007  . TONSILLECTOMY, UNDER AGE 72, HX OF 03/13/2007  . KNEE REPLACEMENT, LEFT, HX OF 03/13/2007    Past Medical History  Diagnosis Date  . Hypertension   . Hypothyroidism   . Depression   . Anxiety   . GERD (gastroesophageal reflux disease)   . H/O hiatal hernia   . Neuromuscular disorder   . Cancer   . Arthritis   . Anemia   . Tuberculosis     Past Surgical History  Procedure  Laterality Date  . Joint replacement    . Mastectomy    . Breast surgery    . Eye surgery    . Enteroscopy  11/09/2011    Procedure: ENTEROSCOPY;  Surgeon: Louis Meckel, MD;  Location: [facility];  Service: Endoscopy;  Laterality: N/A;    History  Substance Use Topics  . Smoking status: Never Smoker   . Smokeless tobacco: Never Used  . Alcohol Use: 0.5 oz/week    1 drink(s) per week     Comment: wine    No family history on file.  Allergies  Allergen Reactions  . Esomeprazole Magnesium     REACTION: causes diarrhea  . Iodine     REACTION: itching    Medication list has been reviewed and updated.  Current Outpatient Prescriptions on File Prior to Visit  Medication Sig Dispense Refill  . amitriptyline (ELAVIL) 25 MG tablet Take 2 tablets (50 mg total) by mouth at bedtime.  60 tablet  5  . citalopram (CELEXA) 20 MG tablet TAKE 1 TABLET DAILY  90 tablet  0  . levothyroxine (SYNTHROID, LEVOTHROID) 50 MCG tablet TAKE 1 TABLET EVERY DAY  30 tablet  2  . omeprazole (PRILOSEC) 40 MG capsule TAKE 1 CAPSULE BY MOUTH DAILY.  90 capsule  1  . simvastatin (ZOCOR) 20 MG tablet Take 20 mg by mouth every evening.      . citalopram (CELEXA) 20 MG tablet TAKE  1 TABLET EVERY DAY  30 tablet  0   No current facility-administered medications on file prior to visit.    Review of Systems:  As per HPI, otherwise negative.    Physical Examination: Filed Vitals:   08/28/12 1454  BP: 126/70  Pulse: 89  Temp: 98.8 F (37.1 C)  Resp: 20   Filed Vitals:   08/28/12 1454  Height: 5' 4.5" (1.638 m)  Weight: 230 lb (104.327 kg)   Body mass index is 38.88 kg/(m^2). Ideal Body Weight: Weight in (lb) to have BMI = 25: 147.6  GEN: WDWN, NAD, Non-toxic, A & O x 3 HEENT: Atraumatic, Normocephalic. Neck supple. No masses, No LAD. Ears and Nose: No external deformity. CV: RRR, No M/G/R. No JVD. No thrill. No extra heart sounds. PULM: CTA B, no wheezes, crackles, rhonchi. No retractions.  No resp. distress. No accessory muscle use. ABD: S, NT, ND, +BS. No rebound. No HSM. EXTR: No c/c/e NEURO Normal gait.  PSYCH: Normally interactive. Conversant. Not depressed or anxious appearing.  Calm demeanor.    Assessment and Plan: Bronchitis zpak tussionex   Signed,  Phillips Odor, MD   [facility] reading (PRIMARY) by  Dr. Dareen Piano.  Chest large hiatal hernia.  mastectomy.  Results for orders placed in visit on 08/28/12  POCT CBC      Result Value Range   WBC 9.3  4.6 - 10.2 K/uL   Lymph, poc 1.7  0.6 - 3.4   POC LYMPH PERCENT 18.6  10 - 50 %L   MID (cbc) 1.0 (*) 0 - 0.9   POC MID % 10.4  0 - 12 %M   POC Granulocyte 6.6  2 - 6.9   Granulocyte percent 71.0  37 - 80 %G   RBC 4.34  4.04 - 5.48 M/uL   Hemoglobin 14.0  12.2 - 16.2 g/dL   HCT, POC 16.1  09.6 - 47.9 %   MCV 102.0 (*) 80 - 97 fL   MCH, POC 32.3 (*) 27 - 31.2 pg   MCHC 31.6 (*) 31.8 - 35.4 g/dL   RDW, POC 04.5     Platelet Count, POC 208  142 - 424 K/uL   MPV 9.2  0 - 99.8 fL

## 2012-09-27 ENCOUNTER — Other Ambulatory Visit: Payer: Self-pay | Admitting: Internal Medicine

## 2012-09-28 ENCOUNTER — Other Ambulatory Visit: Payer: Self-pay | Admitting: Internal Medicine

## 2012-10-01 ENCOUNTER — Other Ambulatory Visit: Payer: Self-pay | Admitting: Internal Medicine

## 2012-11-08 ENCOUNTER — Other Ambulatory Visit: Payer: Self-pay

## 2012-12-21 ENCOUNTER — Other Ambulatory Visit: Payer: Self-pay | Admitting: Internal Medicine

## 2013-02-13 ENCOUNTER — Other Ambulatory Visit: Payer: Self-pay | Admitting: Internal Medicine

## 2013-03-21 ENCOUNTER — Other Ambulatory Visit (INDEPENDENT_AMBULATORY_CARE_PROVIDER_SITE_OTHER): Payer: Medicare PPO

## 2013-03-21 ENCOUNTER — Ambulatory Visit (INDEPENDENT_AMBULATORY_CARE_PROVIDER_SITE_OTHER): Payer: Medicare PPO | Admitting: Internal Medicine

## 2013-03-21 ENCOUNTER — Encounter: Payer: Self-pay | Admitting: Internal Medicine

## 2013-03-21 VITALS — BP 130/80 | HR 105 | Temp 97.7°F | Wt 232.8 lb

## 2013-03-21 DIAGNOSIS — K279 Peptic ulcer, site unspecified, unspecified as acute or chronic, without hemorrhage or perforation: Secondary | ICD-10-CM

## 2013-03-21 DIAGNOSIS — E785 Hyperlipidemia, unspecified: Secondary | ICD-10-CM

## 2013-03-21 DIAGNOSIS — I10 Essential (primary) hypertension: Secondary | ICD-10-CM

## 2013-03-21 DIAGNOSIS — D509 Iron deficiency anemia, unspecified: Secondary | ICD-10-CM

## 2013-03-21 LAB — BASIC METABOLIC PANEL
BUN: 25 mg/dL — ABNORMAL HIGH (ref 6–23)
CO2: 29 meq/L (ref 19–32)
Calcium: 9.3 mg/dL (ref 8.4–10.5)
Chloride: 99 mEq/L (ref 96–112)
Creatinine, Ser: 1 mg/dL (ref 0.4–1.2)
GFR: 58.64 mL/min — AB (ref >60.00)
GLUCOSE: 111 mg/dL — AB (ref 70–99)
POTASSIUM: 3.6 meq/L (ref 3.5–5.1)
Sodium: 137 mEq/L (ref 135–145)

## 2013-03-21 LAB — LIPID PANEL
Cholesterol: 214 mg/dL — ABNORMAL HIGH (ref 0–200)
HDL: 58.2 mg/dL (ref >39.00)
LDL Cholesterol: 114 mg/dL — ABNORMAL HIGH (ref 0–99)
TRIGLYCERIDES: 209 mg/dL — AB (ref 0.0–149.0)
Total CHOL/HDL Ratio: 4
VLDL: 41.8 mg/dL — ABNORMAL HIGH (ref 0.0–40.0)

## 2013-03-21 LAB — HEPATIC FUNCTION PANEL
ALT: 29 U/L (ref 0–35)
AST: 32 U/L (ref 0–37)
Albumin: 4.3 g/dL (ref 3.5–5.2)
Alkaline Phosphatase: 55 U/L (ref 39–117)
Bilirubin, Direct: 0.1 mg/dL (ref 0.0–0.3)
Total Bilirubin: 0.7 mg/dL (ref 0.3–1.2)
Total Protein: 7.7 g/dL (ref 6.0–8.3)

## 2013-03-21 LAB — H. PYLORI ANTIBODY, IGG: H PYLORI IGG: NEGATIVE

## 2013-03-21 LAB — IBC PANEL
Iron: 25 ug/dL — ABNORMAL LOW (ref 42–145)
Saturation Ratios: 4.3 % — ABNORMAL LOW (ref 20.0–50.0)
Transferrin: 417.6 mg/dL — ABNORMAL HIGH (ref 212.0–360.0)

## 2013-03-21 LAB — HEMOGLOBIN AND HEMATOCRIT, BLOOD
HCT: 33.8 % — ABNORMAL LOW (ref 36.0–46.0)
Hemoglobin: 10.9 g/dL — ABNORMAL LOW (ref 12.0–15.0)

## 2013-03-21 MED ORDER — FLUCONAZOLE 100 MG PO TABS: 100.0000 mg | ORAL_TABLET | Freq: Every day | ORAL | Status: DC

## 2013-03-21 NOTE — Patient Instructions (Signed)
Thanks for coming in to see me.  Your weakness may be due to anemia/peptic ulcer disease. Plan Lab: blood counts, test for H. Pylori antibody, Thyroid  Continue the omeprazole  Hypertension-  BP Readings from Last 3 Encounters:  03/21/13 130/80  08/28/12 126/70  06/18/12 124/76   Great control on your present regimen. Plan Continue present medication  Lab - kidney function, potassium  Vulvar-perineal itching - may be a yeast infection Plan Empiric treatment with fluconazole 100 mg/day x 10  If no improvement over the weekend you will need an exam.  Cholesterol -  Plan Hold the simvastatin while taking the fluconazole  Will check cholesterol lab.

## 2013-03-21 NOTE — Progress Notes (Signed)
Subjective:    Patient ID: Daisy Bryant, female    DOB: May 16, 1930, 80 y.o.   MRN: [id number]  HPI Daisy Bryant - she is worried that she has a recurrent ulcer. She has had decreased energy. Yesterday walking out of the grocery store she broke into a cold sweat but no pain or discomfort. No change in stools - not black, no change in texture.   She is having discomfort in the vulva-perineum with itching discomfort. No vaginal discharge.   Past Medical History  Diagnosis Date  . Hypertension   . Hypothyroidism   . Depression   . Anxiety   . GERD (gastroesophageal reflux disease)   . H/O hiatal hernia   . Neuromuscular disorder   . Cancer   . Arthritis   . Anemia   . Tuberculosis    Past Surgical History  Procedure Laterality Date  . Joint replacement    . Mastectomy    . Breast surgery    . Eye surgery    . Enteroscopy  11/09/2011    Procedure: ENTEROSCOPY;  Surgeon: Inda Castle, MD;  Location: [facility];  Service: Endoscopy;  Laterality: N/A;   History reviewed. No pertinent family history. History   Social History  . Marital Status: Widowed    Spouse Name: N/A    Number of Children: 57  . Years of Education: 30   Occupational History  . counseling -retired    Social History Main Topics  . Smoking status: Never Smoker   . Smokeless tobacco: Never Used  . Alcohol Use: 0.5 oz/week    1 drink(s) per week     Comment: wine  . Drug Use: No  . Sexual Activity: No   Other Topics Concern  . 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 Married '55 - 2 years/divorced, married '65 - 2007/widowed. 2 sons - '55, '69; 2 dtr - '56, '68; 3 grandchildren. LIves alone and is independent all ADLs.  ACP- no CPR,  OK for short term mechanical ventilation, no heroic or futile measure in the face of irreversible disease.     Current Outpatient Prescriptions on File Prior to Visit  Medication Sig  Dispense Refill  . amitriptyline (ELAVIL) 25 MG tablet TAKE 2 TABLETS AT BEDTIME  60 tablet  5  . citalopram (CELEXA) 20 MG tablet TAKE 1 TABLET DAILY  90 tablet  0  . hydrochlorothiazide (HYDRODIURIL) 25 MG tablet TAKE 1 TABLET (25 MG TOTAL) BY MOUTH DAILY.  30 tablet  5  . levothyroxine (SYNTHROID, LEVOTHROID) 50 MCG tablet TAKE 1 TABLET EVERY DAY  30 tablet  2  . omeprazole (PRILOSEC) 40 MG capsule TAKE 1 CAPSULE BY MOUTH DAILY.  90 capsule  1  . simvastatin (ZOCOR) 20 MG tablet Take 20 mg by mouth every evening.       No current facility-administered medications on file prior to visit.     Review of Systems System review is negative for any constitutional, cardiac, pulmonary, GI or neuro symptoms or complaints other than as described in the HPI.     Objective:   Physical Exam Filed Vitals:   03/21/13 1556  BP: 130/80  Pulse: 105  Temp: 97.7 F (36.5 C)   Wt Readings from Last 3 Encounters:  03/21/13 232 lb 12.8 oz (105.597 kg)  08/28/12 230 lb (104.327 kg)  06/18/12 232 lb 12.8 oz (105.597 kg)  Gen'l- overweight woman in no acute distress. She does appear a little pale. HEENT - C&S clear, PERRLA Cor- RRR Pul Clear to A&P Abd - BS+, no guarding or rebound, no acute tenderness Neuro - normal  Recent Results (from the past 2160 hour(s))  LIPID PANEL     Status: Abnormal   Collection Time    03/21/13  4:47 PM      Result Value Ref Range   Cholesterol 214 (*) 0 - 200 mg/dL   Comment: ATP III Classification       Desirable:  < 200 mg/dL               Borderline High:  200 - 239 mg/dL          High:  > = 240 mg/dL   Triglycerides 209.0 (*) 0.0 - 149.0 mg/dL   Comment: Normal:  <150 mg/dLBorderline High:  150 - 199 mg/dL   HDL 58.20  >39.00 mg/dL   VLDL 41.8 (*) 0.0 - 40.0 mg/dL   LDL Cholesterol 114 (*) 0 - 99 mg/dL   Total CHOL/HDL Ratio 4     Comment:                Men          Women1/2 Average Risk     3.4          3.3Average Risk          5.0          4.42X  Average Risk          9.6          7.13X Average Risk          15.0          11.0                      HEPATIC FUNCTION PANEL     Status: None   Collection Time    03/21/13  4:47 PM      Result Value Ref Range   Total Bilirubin 0.7  0.3 - 1.2 mg/dL   Bilirubin, Direct 0.1  0.0 - 0.3 mg/dL   Alkaline Phosphatase 55  39 - 117 U/L   AST 32  0 - 37 U/L   ALT 29  0 - 35 U/L   Total Protein 7.7  6.0 - 8.3 g/dL   Albumin 4.3  3.5 - 5.2 g/dL  H. PYLORI ANTIBODY, IGG     Status: None   Collection Time    03/21/13  4:47 PM      Result Value Ref Range   H Pylori IgG Negative  Negative  HEMOGLOBIN AND HEMATOCRIT, BLOOD     Status: Abnormal   Collection Time    03/21/13  4:47 PM      Result Value Ref Range   Hemoglobin 10.9 (*) 12.0 - 15.0 g/dL   HCT 33.8 (*) 36.0 - 24.5 %  BASIC METABOLIC PANEL     Status: Abnormal   Collection Time    03/21/13  4:47 PM      Result Value Ref Range   Sodium 137  135 - 145 mEq/L   Potassium 3.6  3.5 - 5.1 mEq/L   Chloride 99  96 - 112 mEq/L   CO2 29  19 - 32 mEq/L   Glucose, Bld 111 (*) 70 - 99 mg/dL   BUN 25 (*) 6 - 23 mg/dL   Creatinine, Ser 1.0  0.4 - 1.2 mg/dL   Calcium 9.3  8.4 - 10.5 mg/dL   GFR 58.64 (*) >60.00 mL/min  IBC PANEL     Status: Abnormal   Collection Time    03/21/13  4:47 PM      Result Value Ref Range   Iron 25 (*) 42 - 145 ug/dL   Transferrin 417.6 (*) 212.0 - 360.0 mg/dL   Saturation Ratios 4.3 (*) 20.0 - 50.0 %          Assessment & Plan:

## 2013-03-21 NOTE — Progress Notes (Signed)
Pre visit review using our clinic review tool, if applicable. No additional management support is needed unless otherwise documented below in the visit note. 

## 2013-03-22 ENCOUNTER — Telehealth: Payer: Self-pay | Admitting: Internal Medicine

## 2013-03-22 NOTE — Telephone Encounter (Signed)
Relevant patient education assigned to patient using Emmi. ° °

## 2013-03-24 NOTE — Assessment & Plan Note (Signed)
C/o Weakness w/o abdominal pain but similar feeling to when Hgb was low. Lab reveals Hgb 10.9, down from last of 14g. H. Pylori antibody - negative. Iron is low and iron saturation is very low. She has no signs of frank or brisk bleeding - no need for hospital admission.  Plan Continue PPI  Start otc Iron tablets twice a day or she may have IV iron - MyChart message/lab comment sent, await response.  GI consult.

## 2013-03-25 ENCOUNTER — Encounter: Payer: Self-pay | Admitting: Gastroenterology

## 2013-03-25 ENCOUNTER — Telehealth: Payer: Self-pay | Admitting: Internal Medicine

## 2013-03-25 NOTE — Telephone Encounter (Signed)
I believe that oral iron replacement is more practical. She should here soon about an appointment with GI - please check with Hilda Blades on this request.

## 2013-03-25 NOTE — Telephone Encounter (Signed)
I called the pt.  She has an appt with Dr. Fuller Plan on May 7.  She wants to know if you will call in an iron replacement to [facility] on [address].

## 2013-03-25 NOTE — Telephone Encounter (Signed)
Medicine prescribed for burning and itching is working well.  Pt wants to know if she is only to take iron pills or if she is to have a transfusion.

## 2013-03-25 NOTE — Telephone Encounter (Signed)
Iron is otc. She needs approx 325 mg iron daily. Pharmacist can point out product.

## 2013-03-26 ENCOUNTER — Telehealth: Payer: Self-pay

## 2013-03-26 NOTE — Telephone Encounter (Signed)
Message copied by Marlon Pel on Tue Mar 26, 2013  9:25 AM ------      Message from: Lucio Edward T      Created: Mon Mar 25, 2013  8:40 PM       Legrand Como, We can easily see her much sooner if she is agreeable to see one of our APPs for this visit. We will contact her. Follow up will be with MD. Kern Alberta                  ----- Message -----         From: Neena Rhymes, MD         Sent: 03/25/2013   6:05 PM           To: Ladene Artist, MD            I referred Ms. Pair for possible slow UGI bleed with drop in Hgb from 14 to 10.9 g and marked iron deficiency. She has had UGI bleed in the past. She was given an appointment for May 7th which seems a little too far out in time.            Please look at her chart and if you concur see if there is an earlier appointment.            Thanks       ------

## 2013-03-26 NOTE — Telephone Encounter (Signed)
Patient is rescheduled to 03/28/13 8:30 with Alonza Bogus, PA.  She is aware and agreeable to the appt

## 2013-03-26 NOTE — Telephone Encounter (Signed)
Pt is aware.  

## 2013-03-27 ENCOUNTER — Encounter: Payer: Self-pay | Admitting: *Deleted

## 2013-03-28 ENCOUNTER — Ambulatory Visit (INDEPENDENT_AMBULATORY_CARE_PROVIDER_SITE_OTHER): Payer: Medicare PPO | Admitting: Gastroenterology

## 2013-03-28 ENCOUNTER — Other Ambulatory Visit: Payer: Self-pay | Admitting: *Deleted

## 2013-03-28 ENCOUNTER — Encounter: Payer: Self-pay | Admitting: Gastroenterology

## 2013-03-28 VITALS — BP 132/80 | HR 88 | Ht 67.0 in | Wt 228.2 lb

## 2013-03-28 DIAGNOSIS — D509 Iron deficiency anemia, unspecified: Secondary | ICD-10-CM | POA: Insufficient documentation

## 2013-03-28 DIAGNOSIS — K449 Diaphragmatic hernia without obstruction or gangrene: Secondary | ICD-10-CM

## 2013-03-28 DIAGNOSIS — K279 Peptic ulcer, site unspecified, unspecified as acute or chronic, without hemorrhage or perforation: Secondary | ICD-10-CM

## 2013-03-28 NOTE — Patient Instructions (Signed)
Come to our lab, basement level next Thursday 04-04-2013. We will be checking your hemoglobin level. Continue iron supplement per Dr. Linda Hedges.  You have been scheduled for an endoscopy with propofol. Please follow written instructions given to you at your visit today. If you use inhalers (even only as needed), please bring them with you on the day of your procedure.

## 2013-03-28 NOTE — Progress Notes (Signed)
Reviewed and agree with management plan. Please send IgA and tTG if not done within the past year  Norberto Sorenson T. Fuller Plan, MD [facility]

## 2013-03-28 NOTE — Progress Notes (Signed)
03/28/2013 Daisy Bryant [id number] Aug 17, 1930   History of Present Illness:  This is a pleasant 80 year old female who is known to Dr. Fuller Plan.  She has a history of iron deficiency anemia. She has undergone GI evaluation for this in the past. Her last colonoscopy was in April 2010 at which time she was found to have diverticulosis and one hyperplastic colonic polyp that was removed. She also underwent EGD at that time and was found to have a 6 mm ulcer in the antrum of the stomach. The procedures were performed for issues with anemia at that time.  Then, in November 2013, she underwent an enteroscopy for the same issue. During the study she was found to have a large hiatal hernia encompassing almost the entire stomach as well as an early esophageal stricture; no Cameron erosions were seen at that time. That was followed by an upper GI series which showed a very large hiatal hernia that was 90% intrathoracic with mild esophageal dysfunction.  She was instructed that there is no need for surgery unless she was symptomatic from her hernia, which she has never been.  Anyway, her hemoglobin eventually returned to normal and was 14.0 g in August 2014. She went to Dr. Linda Hedges last week complaining of weakness and feeling like her blood counts are low. Her hemoglobin was rechecked and it was down to 10.9 g. She denies ever seeing any dark or bloody stools. She denies any abdominal pain. She does take omeprazole 40 mg daily and states that it controls her reflux issues well. Her iron levels were also very low when she was seen last week with an iron level of 25 and iron percent saturation of 4.3%.  She was placed on iron supplements 325 mg daily.   Current Medications, Allergies, Past Medical History, Past Surgical History, Family History and Social History were reviewed in [employer] record.   Physical Exam: BP 132/80  Pulse 88  Ht 5\' 7"  (1.702 m)  Wt 228 lb 3.2 oz (103.511 kg)  BMI  35.73 kg/m2 General: Well developed white female in no acute distress Head: Normocephalic and atraumatic Eyes:  Sclerae anicteric, conjunctiva pink  Ears: Normal auditory acuity Lungs: Clear throughout to auscultation Heart: Regular rate and rhythm Abdomen: Soft, non-distended.  Normal bowel sounds.  Non-tender. Musculoskeletal: Symmetrical with no gross deformities  Extremities: No edema  Neurological: Alert oriented x 4, grossly non-focal Psychological:  Alert and cooperative. Normal mood and affect  Assessment and Recommendations: -Iron deficiency anemia, recurrent:   3 gram drop in her hemoglobin since 7 months ago. No dark or bloody stools. She has history of gastric ulcer and also a very large hiatal hernia.  Rule out recurrent ulcer disease versus Cameron erosions from hiatal hernia. We will schedule her for an EGD for initial evaluation. We will increase her omeprazole to 40 mg twice daily for now. She'll continue her iron supplements as per Dr. Linda Hedges, 325 mg daily. We will recheck a hemoglobin next week to be sure that it is stable.  The risks, benefits, and alternatives were discussed with the patient and she consents to proceed.  If EGD is negative, then she may need repeat colonoscopy and/or wireless capsule endoscopy. -Large hiatal hernia:  As stated above. -History of gastric ulcer:  As stated above.

## 2013-04-10 NOTE — Progress Notes (Signed)
   Subjective:    Patient ID: Daisy Bryant, female    DOB: 11-Nov-1930, 80 y.o.   MRN: [id number]  HPI Cancelled appointment   Review of Systems     Objective:   Physical Exam        Assessment & Plan:   This encounter was created in error - please disregard.

## 2013-04-12 ENCOUNTER — Ambulatory Visit (AMBULATORY_SURGERY_CENTER): Payer: Medicare PPO | Admitting: Gastroenterology

## 2013-04-12 ENCOUNTER — Encounter: Payer: Self-pay | Admitting: Gastroenterology

## 2013-04-12 VITALS — BP 185/79 | HR 62 | Temp 99.1°F | Resp 14 | Ht 67.0 in | Wt 228.0 lb

## 2013-04-12 DIAGNOSIS — D509 Iron deficiency anemia, unspecified: Secondary | ICD-10-CM

## 2013-04-12 DIAGNOSIS — K2990 Gastroduodenitis, unspecified, without bleeding: Secondary | ICD-10-CM

## 2013-04-12 DIAGNOSIS — K2940 Chronic atrophic gastritis without bleeding: Secondary | ICD-10-CM

## 2013-04-12 DIAGNOSIS — K2970 Gastritis, unspecified, without bleeding: Secondary | ICD-10-CM

## 2013-04-12 MED ORDER — SODIUM CHLORIDE 0.9 % IV SOLN: 500.0000 mL | INTRAVENOUS | Status: DC

## 2013-04-12 NOTE — Progress Notes (Signed)
Procedure ends, to recovery, report given and VSS. 

## 2013-04-12 NOTE — Progress Notes (Signed)
Called to room to assist during endoscopic procedure.  Patient ID and intended procedure confirmed with present staff. Received instructions for my participation in the procedure from the performing physician.  

## 2013-04-12 NOTE — Op Note (Signed)
[place]  [address] [place], [id number]   ENDOSCOPY PROCEDURE REPORT  PATIENT: Daisy, Bryant  MR#: [id number] BIRTHDATE: 1930/12/12 , 82  yrs. old GENDER: Female ENDOSCOPIST: Ladene Artist, MD, [facility] PROCEDURE DATE:  04/12/2013 PROCEDURE:  EGD w/ biopsy ASA CLASS:     Class II INDICATIONS:  Iron deficiency anemia. MEDICATIONS: MAC sedation, administered by CRNA and propofol (Diprivan) 150mg  IV TOPICAL ANESTHETIC: none DESCRIPTION OF PROCEDURE: After the risks benefits and alternatives of the procedure were thoroughly explained, informed consent was obtained.  The LB [id number] endoscope was introduced through the mouth and advanced to the second portion of the duodenum. Without limitations.  The instrument was slowly withdrawn as the mucosa was fully examined.  ESOPHAGUS: The mucosa of the esophagus appeared normal. STOMACH: Mild gastritis (inflammation) was found in the gastric body. It was erythematous and friable Multiple biopsies were performed.   The stomach otherwise appeared normal. DUODENUM: The duodenal mucosa showed no abnormalities in the bulb and second portion of the duodenum.  Cold forceps biopsies were taken in the bulb and second portion.  Retroflexed views revealed a large hiatal hernia, the majority of the stomach was intrathoracic. The scope was then withdrawn from the patient and the procedure completed.  COMPLICATIONS: There were no complications.  ENDOSCOPIC IMPRESSION: 1.   Gastritis in the gastric body; multiple biopsies 2.   Large hiatal hernia, intrathoracic stomach  RECOMMENDATIONS: 1.  Anti-reflux regimen long term 2.  Await pathology results 3.  Continue PPI dailly long term 4.  Continue Fe replacement and follow up with your PCP  eSigned:  Ladene Artist, MD, [facility] 04/12/2013 10:44 AM

## 2013-04-12 NOTE — Patient Instructions (Signed)
Discharge instructions given with verbal understanding. Handouts on gastritis and a hiatal hernia. Resume previous medications. YOU HAD AN ENDOSCOPIC PROCEDURE TODAY AT [facility]: Refer to the procedure report that was given to you for any specific questions about what was found during the examination.  If the procedure report does not answer your questions, please call your gastroenterologist to clarify.  If you requested that your care partner not be given the details of your procedure findings, then the procedure report has been included in a sealed envelope for you to review at your convenience later.  YOU SHOULD EXPECT: Some feelings of bloating in the abdomen. Passage of more gas than usual.  Walking can help get rid of the air that was put into your GI tract during the procedure and reduce the bloating. If you had a lower endoscopy (such as a colonoscopy or flexible sigmoidoscopy) you may notice spotting of blood in your stool or on the toilet paper. If you underwent a bowel prep for your procedure, then you may not have a normal bowel movement for a few days.  DIET: Your first meal following the procedure should be a light meal and then it is ok to progress to your normal diet.  A half-sandwich or bowl of soup is an example of a good first meal.  Heavy or fried foods are harder to digest and may make you feel nauseous or bloated.  Likewise meals heavy in dairy and vegetables can cause extra gas to form and this can also increase the bloating.  Drink plenty of fluids but you should avoid alcoholic beverages for 24 hours.  ACTIVITY: Your care partner should take you home directly after the procedure.  You should plan to take it easy, moving slowly for the rest of the day.  You can resume normal activity the day after the procedure however you should NOT DRIVE or use heavy machinery for 24 hours (because of the sedation medicines used during the test).    SYMPTOMS TO REPORT  IMMEDIATELY: A gastroenterologist can be reached at any hour.  During normal business hours, 8:30 AM to 5:00 PM Monday through Friday, call [phone].  After hours and on weekends, please call the GI answering service at [phone] who will take a message and have the physician on call contact you.   Following upper endoscopy (EGD)  Vomiting of blood or coffee ground material  New chest pain or pain under the shoulder blades  Painful or persistently difficult swallowing  New shortness of breath  Fever of 100F or higher  Black, tarry-looking stools  FOLLOW UP: If any biopsies were taken you will be contacted by phone or by letter within the next 1-3 weeks.  Call your gastroenterologist if you have not heard about the biopsies in 3 weeks.  Our staff will call the home number listed on your records the next business day following your procedure to check on you and address any questions or concerns that you may have at that time regarding the information given to you following your procedure. This is a courtesy call and so if there is no answer at the home number and we have not heard from you through the emergency physician on call, we will assume that you have returned to your regular daily activities without incident.  SIGNATURES/CONFIDENTIALITY: You and/or your care partner have signed paperwork which will be entered into your electronic medical record.  These signatures attest to the fact that that the  information above on your After Visit Summary has been reviewed and is understood.  Full responsibility of the confidentiality of this discharge information lies with you and/or your care-partner.

## 2013-04-13 ENCOUNTER — Ambulatory Visit: Payer: 59

## 2013-04-13 ENCOUNTER — Ambulatory Visit (INDEPENDENT_AMBULATORY_CARE_PROVIDER_SITE_OTHER): Payer: 59 | Admitting: Emergency Medicine

## 2013-04-13 VITALS — BP 144/82 | HR 99 | Temp 98.1°F | Resp 18 | Ht 65.0 in | Wt 233.0 lb

## 2013-04-13 DIAGNOSIS — M25539 Pain in unspecified wrist: Secondary | ICD-10-CM

## 2013-04-13 DIAGNOSIS — S63509A Unspecified sprain of unspecified wrist, initial encounter: Secondary | ICD-10-CM

## 2013-04-13 NOTE — Progress Notes (Signed)
[facility] [address] [place] [address] [phone]  Date:  04/13/2013   Name:  Daisy Bryant   DOB:  1930/10/18   MRN:  [id number]  PCP:  Adella Hare, MD    Chief Complaint: Wrist Pain   History of Present Illness:  Daisy Bryant is a 80 y.o. very pleasant female patient who presents with the following:  Tripped over a dog and fell on the floor.  Injured her left wrist.  This occurred Thursday.  Still has pain.  Denies other injury.  No improvement with over the counter medications or other home remedies. Denies other complaint or health concern today.   Patient Active Problem List   Diagnosis Date Noted  . Anemia, iron deficiency 03/28/2013  . Hiatal hernia 11/09/2011  . FLANK PAIN, RIGHT 08/21/2008  . ANEMIA, IRON DEFICIENCY 04/22/2008  . ABDOMINAL PAIN-LLQ 04/22/2008  . UNSPECIFIED ANEMIA 04/17/2008  . PUD 04/17/2008  . PEPTIC STRICTURE 04/17/2008  . [place] ALL[facility]GY 04/17/2008  . CELIAC DISEASE 09/19/2007  . ADENOCARCINOMA, BREAST, RIGHT 03/13/2007  . HYPOTHYROIDISM 03/13/2007  . HYPERLIPIDEMIA 03/13/2007  . DEPRESSION 03/13/2007  . ATTENTION DEFICIT DISORDER 03/13/2007  . PERIPHERAL NEUROPATHY 03/13/2007  . HYPERTENSION 03/13/2007  . GASTROESOPHAGEAL REFLUX DISEASE 03/13/2007  . DIVERTICULOSIS, COLON 03/13/2007  . ROSACEA 03/13/2007  . DEGENERATIVE JOINT DISEASE 03/13/2007  . COUGH, CHRONIC 03/13/2007  . GASTROINTESTINAL HEMORRHAGE, HX OF 03/13/2007  . TONSILLECTOMY, UNDER AGE 62, HX OF 03/13/2007  . KNEE REPLACEMENT, LEFT, HX OF 03/13/2007    Past Medical History  Diagnosis Date  . Hypertension   . Hypothyroidism   . Depression   . Anxiety   . GERD (gastroesophageal reflux disease)   . H/O hiatal hernia 2002  . Neuromuscular disorder   . Cancer   . Arthritis   . Anemia   . Tuberculosis   . Acute gastric ulcer without mention of hemorrhage, perforation, or obstruction 2002  . Stricture and stenosis of esophagus 2002   . Diverticulosis of colon (without mention of hemorrhage)   . Colon polyp 2010    HYPERPLASTIC POLYP    Past Surgical History  Procedure Laterality Date  . Joint replacement    . Mastectomy    . Breast surgery    . Eye surgery    . Enteroscopy  11/09/2011    Procedure: ENTEROSCOPY;  Surgeon: Inda Castle, MD;  Location: [facility];  Service: Endoscopy;  Laterality: N/A;    History  Substance Use Topics  . Smoking status: Never Smoker   . Smokeless tobacco: Never Used  . Alcohol Use: 0.5 oz/week    1 drink(s) per week     Comment: wine    Family History  Problem Relation Age of Onset  . Pancreatic cancer Father     Allergies  Allergen Reactions  . Esomeprazole Magnesium     REACTION: causes diarrhea  . Iodine     REACTION: itching    Medication list has been reviewed and updated.  Current Outpatient Prescriptions on File Prior to Visit  Medication Sig Dispense Refill  . amitriptyline (ELAVIL) 25 MG tablet TAKE 2 TABLETS AT BEDTIME  60 tablet  5  . citalopram (CELEXA) 20 MG tablet TAKE 1 TABLET DAILY  90 tablet  0  . fluconazole (DIFLUCAN) 100 MG tablet Take 1 tablet (100 mg total) by mouth daily.  10 tablet  1  . hydrochlorothiazide (HYDRODIURIL) 25 MG tablet TAKE 1 TABLET (25 MG TOTAL) BY MOUTH  DAILY.  30 tablet  5  . levothyroxine (SYNTHROID, LEVOTHROID) 50 MCG tablet TAKE 1 TABLET EVERY DAY  30 tablet  2  . omeprazole (PRILOSEC) 40 MG capsule TAKE 1 CAPSULE BY MOUTH DAILY.  90 capsule  1  . simvastatin (ZOCOR) 20 MG tablet Take 20 mg by mouth every evening.       No current facility-administered medications on file prior to visit.    Review of Systems:  As per HPI, otherwise negative.    Physical Examination: Filed Vitals:   04/13/13 1357  BP: 144/82  Pulse: 99  Temp: 98.1 F (36.7 C)  Resp: 18   Filed Vitals:   04/13/13 1357  Height: 5\' 5"  (1.651 m)  Weight: 233 lb (105.688 kg)   Body mass index is 38.77 kg/(m^2). Ideal Body Weight:  Weight in (lb) to have BMI = 25: 149.9   GEN: WDWN, NAD, Non-toxic, Alert & Oriented x 3 HEENT: Atraumatic, Normocephalic.  Ears and Nose: No external deformity. EXTR: No clubbing/cyanosis/edema NEURO: Normal gait.  PSYCH: Normally interactive. Conversant. Not depressed or anxious appearing.  Calm demeanor.  LEFT wrist tender and minimal swelling.  No deformity or ecchymosis.  Assessment and Plan: Wrist sprain  Signed,  Ellison Carwin, MD   [facility] reading (PRIMARY) by  Dr. Ouida Sills negative .

## 2013-04-13 NOTE — Patient Instructions (Signed)
Wrist Pain Wrist injuries are frequent in adults and children. A sprain is an injury to the ligaments that hold your bones together. A strain is an injury to muscle or muscle cord-like structures (tendons) from stretching or pulling. Generally, when wrists are moderately tender to touch following a fall or injury, a break in the bone (fracture) may be present. Most wrist sprains or strains are better in 3 to 5 days, but complete healing may take several weeks. HOME CARE INSTRUCTIONS   Put ice on the injured area.  Put ice in a plastic bag.  Place a towel between your skin and the bag.  Leave the ice on for 15-20 minutes, 03-04 times a day, for the first 2 days.  Keep your arm raised above the level of your heart whenever possible to reduce swelling and pain.  Rest the injured area for at least 48 hours or as directed by your caregiver.  If a splint or elastic bandage has been applied, use it for as long as directed by your caregiver or until seen by a caregiver for a follow-up exam.  Only take over-the-counter or prescription medicines for pain, discomfort, or fever as directed by your caregiver.  Keep all follow-up appointments. You may need to follow up with a specialist or have follow-up X-rays. Improvement in pain level is not a guarantee that you did not fracture a bone in your wrist. The only way to determine whether or not you have a broken bone is by X-ray. SEEK IMMEDIATE MEDICAL CARE IF:   Your fingers are swollen, very red, white, or cold and blue.  Your fingers are numb or tingling.  You have increasing pain.  You have difficulty moving your fingers. MAKE SURE YOU:   Understand these instructions.  Will watch your condition.  Will get help right away if you are not doing well or get worse. Document Released: 09/29/2004 Document Revised: 03/14/2011 Document Reviewed: 02/10/2010 [employer] Patient Information 2014 [employer].  

## 2013-04-15 ENCOUNTER — Telehealth: Payer: Self-pay | Admitting: *Deleted

## 2013-04-15 NOTE — Telephone Encounter (Signed)
  Follow up Call-  Call back number 04/12/2013  Post procedure Call Back phone  # [phone]  Permission to leave phone message Yes     Patient questions:  Do you have a fever, pain , or abdominal swelling? no Pain Score  0 *  Have you tolerated food without any problems? yes  Have you been able to return to your normal activities? yes  Do you have any questions about your discharge instructions: Diet   no Medications  no Follow up visit  no  Do you have questions or concerns about your Care? no  Actions: * If pain score is 4 or above: No action needed, pain <4.

## 2013-04-16 ENCOUNTER — Encounter: Payer: Self-pay | Admitting: Gastroenterology

## 2013-05-02 ENCOUNTER — Telehealth: Payer: Self-pay | Admitting: Internal Medicine

## 2013-05-02 MED ORDER — CITALOPRAM HYDROBROMIDE 20 MG PO TABS: ORAL_TABLET | ORAL | Status: DC

## 2013-05-02 NOTE — Telephone Encounter (Signed)
Called left a message rx sent in.

## 2013-05-02 NOTE — Telephone Encounter (Signed)
Done erx 

## 2013-05-02 NOTE — Telephone Encounter (Signed)
Pt is out of citalorpram.  She was Dr. Linda Hedges pt and is on Dr. Donneta Romberg list for the fall.  Her pharmacy is [facility] on [address]

## 2013-05-09 ENCOUNTER — Ambulatory Visit: Payer: Medicare PPO | Admitting: Gastroenterology

## 2013-05-30 ENCOUNTER — Other Ambulatory Visit: Payer: Self-pay | Admitting: *Deleted

## 2013-05-30 MED ORDER — LEVOTHYROXINE SODIUM 50 MCG PO TABS: ORAL_TABLET | ORAL | Status: DC

## 2013-05-30 MED ORDER — HYDROCHLOROTHIAZIDE 25 MG PO TABS: ORAL_TABLET | ORAL | Status: DC

## 2013-05-30 MED ORDER — AMITRIPTYLINE HCL 25 MG PO TABS: ORAL_TABLET | ORAL | Status: DC

## 2013-09-03 ENCOUNTER — Ambulatory Visit (INDEPENDENT_AMBULATORY_CARE_PROVIDER_SITE_OTHER): Payer: Medicare PPO | Admitting: Internal Medicine

## 2013-09-03 ENCOUNTER — Other Ambulatory Visit (INDEPENDENT_AMBULATORY_CARE_PROVIDER_SITE_OTHER): Payer: Medicare PPO

## 2013-09-03 ENCOUNTER — Encounter: Payer: Self-pay | Admitting: Internal Medicine

## 2013-09-03 VITALS — BP 124/74 | HR 94 | Temp 98.0°F | Resp 16 | Ht 67.5 in | Wt 233.8 lb

## 2013-09-03 DIAGNOSIS — F329 Major depressive disorder, single episode, unspecified: Secondary | ICD-10-CM

## 2013-09-03 DIAGNOSIS — D509 Iron deficiency anemia, unspecified: Secondary | ICD-10-CM

## 2013-09-03 DIAGNOSIS — I10 Essential (primary) hypertension: Secondary | ICD-10-CM

## 2013-09-03 DIAGNOSIS — D500 Iron deficiency anemia secondary to blood loss (chronic): Secondary | ICD-10-CM

## 2013-09-03 DIAGNOSIS — E785 Hyperlipidemia, unspecified: Secondary | ICD-10-CM

## 2013-09-03 DIAGNOSIS — K219 Gastro-esophageal reflux disease without esophagitis: Secondary | ICD-10-CM

## 2013-09-03 DIAGNOSIS — F3289 Other specified depressive episodes: Secondary | ICD-10-CM

## 2013-09-03 DIAGNOSIS — Z23 Encounter for immunization: Secondary | ICD-10-CM

## 2013-09-03 LAB — CBC
HCT: 39.3 % (ref 36.0–46.0)
Hemoglobin: 13 g/dL (ref 12.0–15.0)
MCHC: 33.2 g/dL (ref 30.0–36.0)
MCV: 90.4 fl (ref 78.0–100.0)
PLATELETS: 314 10*3/uL (ref 150.0–400.0)
RBC: 4.35 Mil/uL (ref 3.87–5.11)
RDW: 16.9 % — ABNORMAL HIGH (ref 11.5–15.5)
WBC: 5 10*3/uL (ref 4.0–10.5)

## 2013-09-03 MED ORDER — SIMVASTATIN 20 MG PO TABS: 20.0000 mg | ORAL_TABLET | Freq: Every evening | ORAL | Status: DC

## 2013-09-03 MED ORDER — CITALOPRAM HYDROBROMIDE 20 MG PO TABS: ORAL_TABLET | ORAL | Status: DC

## 2013-09-03 MED ORDER — LEVOTHYROXINE SODIUM 50 MCG PO TABS: ORAL_TABLET | ORAL | Status: DC

## 2013-09-03 MED ORDER — HYDROCHLOROTHIAZIDE 25 MG PO TABS: ORAL_TABLET | ORAL | Status: DC

## 2013-09-03 MED ORDER — AMITRIPTYLINE HCL 25 MG PO TABS: ORAL_TABLET | ORAL | Status: DC

## 2013-09-03 MED ORDER — OMEPRAZOLE 40 MG PO CPDR: DELAYED_RELEASE_CAPSULE | ORAL | Status: DC

## 2013-09-03 NOTE — Progress Notes (Signed)
   Subjective:    Patient ID: Daisy Bryant, female    DOB: 1930/03/26, 80 y.o.   MRN: [id number]  HPI The patient is coming in to establish with a new provider. She is a pleasant 80 YO female with PMH of iron deficiency anemia with recent bleeding episode in March of this year, left knee replacement, hypothyroidism, some depression in her 69s (well controlled on current regimen). She is doing well overall and feels that her energy and (she ties this to her blood counts) have improved or are stable from the last time. She does not feel as pale and is able to walk around the grocery store without getting winded. She has no difficulties with poor vision, hearing. She has not fallen at home and is a non-smoker. She wishes to get the flu shot today. She denies any chest pains, SOB, abdominal pain. She occasionally has diarrhea related to foods she eats and never constipation. She does still have dark stools and is still taking iron pills daily. She continues to take her PPI and understands that this will likely be a lifelong problem for her. She has a dog and a cat at home which give her much pleasure and company. She also has a child in town and [place] and 1 that lives in [place].    Review of Systems  Constitutional: Negative for fever, activity change, appetite change, fatigue and unexpected weight change.  Respiratory: Negative for cough, chest tightness, shortness of breath and wheezing.   Cardiovascular: Negative for chest pain, palpitations and leg swelling.  Endocrine: Negative for cold intolerance, heat intolerance, polydipsia, polyphagia and polyuria.  Musculoskeletal: Positive for arthralgias. Negative for back pain, gait problem, joint swelling, myalgias, neck pain and neck stiffness.  Neurological: Negative for dizziness, syncope, speech difficulty, weakness, light-headedness, numbness and headaches.  Psychiatric/Behavioral: Negative for suicidal ideas, sleep disturbance,  dysphoric mood and agitation.       Objective:   Physical Exam  Constitutional: She is oriented to person, place, and time. She appears well-developed and well-nourished. No distress.  HENT:  Head: Normocephalic and atraumatic.  Eyes: EOM are normal. Pupils are equal, round, and reactive to light.  Neck: Normal range of motion. Neck supple. No JVD present. No tracheal deviation present. No thyromegaly present.  Cardiovascular: Normal rate and regular rhythm.   No murmur heard. Pulmonary/Chest: Effort normal and breath sounds normal. No respiratory distress. She has no wheezes. She has no rales. She exhibits no tenderness.  Abdominal: Soft. Bowel sounds are normal. She exhibits no distension. There is no tenderness. There is no rebound and no guarding.  Musculoskeletal: Normal range of motion. She exhibits tenderness.  Right knee with some tenderness   Neurological: She is alert and oriented to person, place, and time. No cranial nerve deficit.  Skin: Skin is warm and dry. She is not diaphoretic.          Assessment & Plan:

## 2013-09-03 NOTE — Assessment & Plan Note (Addendum)
Patient is on HCTZ 25 mg daily and BP well controlled 124/74. Recheck BMP at next visit.

## 2013-09-03 NOTE — Assessment & Plan Note (Addendum)
Chronic gastritis on last endoscopy March 2015 and lifelong iron supplementation and omeprazole. Will recheck CBC today. No gross signs of blood in stool.

## 2013-09-03 NOTE — Assessment & Plan Note (Addendum)
She is doing well on current regimen of celexa and elavil and will continue.

## 2013-09-03 NOTE — Progress Notes (Signed)
Pre visit review using our clinic review tool, if applicable. No additional management support is needed unless otherwise documented below in the visit note. 

## 2013-09-03 NOTE — Assessment & Plan Note (Addendum)
Continue zocor and recheck lipid panel at next visit.

## 2013-09-03 NOTE — Patient Instructions (Signed)
We have given you a flu shot today and refilled your medicines. We will check your blood counts today and let you know about the results. Work on walking around your house to get moving a little more and when the weather gets cooler try to take a walk 3-4 times per week. Come back in about 6-12 months for a check up unless you are having problems. In that case call our office and come back sooner.   Exercise to Stay Healthy Exercise helps you become and stay healthy. EXERCISE IDEAS AND TIPS Choose exercises that:  You enjoy.  Fit into your day. You do not need to exercise really hard to be healthy. You can do exercises at a slow or medium level and stay healthy. You can:  Stretch before and after working out.  Try yoga, Pilates, or tai chi.  Lift weights.  Walk fast, swim, jog, run, climb stairs, bicycle, dance, or rollerskate.  Take aerobic classes. Exercises that burn about 150 calories:  Running 1  miles in 15 minutes.  Playing volleyball for 45 to 60 minutes.  Washing and waxing a car for 45 to 60 minutes.  Playing touch football for 45 minutes.  Walking 1  miles in 35 minutes.  Pushing a stroller 1  miles in 30 minutes.  Playing basketball for 30 minutes.  Raking leaves for 30 minutes.  Bicycling 5 miles in 30 minutes.  Walking 2 miles in 30 minutes.  Dancing for 30 minutes.  Shoveling snow for 15 minutes.  Swimming laps for 20 minutes.  Walking up stairs for 15 minutes.  Bicycling 4 miles in 15 minutes.  Gardening for 30 to 45 minutes.  Jumping rope for 15 minutes.  Washing windows or floors for 45 to 60 minutes. Document Released: 01/22/2010 Document Revised: 03/14/2011 Document Reviewed: 01/22/2010 [facility] Patient Information 2015 [place], [place]. This information is not intended to replace advice given to you by your health care provider. Make sure you discuss any questions you have with your health care provider.

## 2013-09-04 ENCOUNTER — Encounter: Payer: Self-pay | Admitting: Internal Medicine

## 2013-09-04 NOTE — Assessment & Plan Note (Signed)
Given history of multiple episodes of bleeding will likely need lifelong PPI therapy.

## 2013-10-18 ENCOUNTER — Other Ambulatory Visit: Payer: Self-pay

## 2013-10-26 ENCOUNTER — Ambulatory Visit (INDEPENDENT_AMBULATORY_CARE_PROVIDER_SITE_OTHER): Payer: 59 | Admitting: Internal Medicine

## 2013-10-26 VITALS — BP 130/80 | HR 91 | Temp 98.0°F | Resp 18 | Ht 65.5 in | Wt 235.4 lb

## 2013-10-26 DIAGNOSIS — R059 Cough, unspecified: Secondary | ICD-10-CM

## 2013-10-26 DIAGNOSIS — J029 Acute pharyngitis, unspecified: Secondary | ICD-10-CM

## 2013-10-26 DIAGNOSIS — R05 Cough: Secondary | ICD-10-CM

## 2013-10-26 LAB — POCT RAPID STREP A (OFFICE): Rapid Strep A Screen: NEGATIVE

## 2013-10-26 MED ORDER — BENZONATATE 100 MG PO CAPS: 100.0000 mg | ORAL_CAPSULE | Freq: Two times a day (BID) | ORAL | Status: DC | PRN

## 2013-10-26 MED ORDER — AMOXICILLIN 875 MG PO TABS: 875.0000 mg | ORAL_TABLET | Freq: Two times a day (BID) | ORAL | Status: DC

## 2013-10-26 MED ORDER — HYDROCODONE-HOMATROPINE 5-1.5 MG/5ML PO SYRP: 5.0000 mL | ORAL_SOLUTION | Freq: Four times a day (QID) | ORAL | Status: DC | PRN

## 2013-10-26 NOTE — Progress Notes (Signed)
Subjective:  This chart was scribed for Tami Lin, MD by Dellis Filbert, ED Scribe at [facility].The patient was seen in exam room 11 and the patient's care was started at 11:06 AM.   Patient ID: Daisy Bryant, female    DOB: [birth year], 80 y.o.   MRN: [id number]  HPI HPI Comments: Daisy Bryant is a 80 y.o. female who presents to [facility] complaining of a sore throat, cough and CP which began four days ago. Pt states she was on a field trip when the onset of the infection probably started and she had episodes of chills while on the field trip but they have subside since that episode. Pt states the sore throat started at the beginning of the infection  Pt has has trouble swallowing, and trouble breathing with movement as associated symptoms. She says the cough causes her pain in her rib cage. Pt says at night when she tries to sleep she has trouble breathing and persistent cough that has been keeping her up. Pt does not have any changes in her medication. Pt cannot remember the last time she had a lung infection, but does note usually around October when the seasons were changing she develops similar complaints. She denies fever, appetite change,and chills. Pt works as a Education officer, museum.  Pt has a lot of calcified spots because she was exposed to TB when she was 80 years old.  Review of Systems  Constitutional: Negative for fever, chills and appetite change.  HENT: Positive for sore throat.   Respiratory: Positive for cough and shortness of breath.   Cardiovascular: Positive for chest pain.  Psychiatric/Behavioral: Positive for sleep disturbance.      Objective:   Physical Exam  Nursing note and vitals reviewed. Constitutional: She is oriented to person, place, and time. She appears well-developed and well-nourished. No distress.  HENT:  Head: Normocephalic and atraumatic.  Nose is clear.  Throat is red with slight exudate.  Eyes: EOM are normal.  Neck: Normal range  of motion.  Cardiovascular: Normal rate, regular rhythm and normal heart sounds.   No murmur heard. Pulmonary/Chest: Effort normal.  Scattered bronchi Delay on forced expiration and no wheezing   Musculoskeletal: Normal range of motion.  Lymphadenopathy:    She has no cervical adenopathy.  Neurological: She is alert and oriented to person, place, and time.  Skin: Skin is warm and dry.  Psychiatric: She has a normal mood and affect. Her behavior is normal.   BP 130/80  Pulse 91  Temp(Src) 98 F (36.7 C) (Oral)  Resp 18  Ht 5' 5.5" (1.664 m)  Wt 235 lb 6.4 oz (106.777 kg)  BMI 38.56 kg/m2  SpO2 95%      Assessment & Plan:  I personally performed the services described in this documentation, which was scribed in my presence. The recorded information has been reviewed and is accurate.  ST (sore throat) - Plan: POCT rapid strep A, Culture, Group A Strep  Cough - Plan: POCT rapid strep A  Meds ordered this encounter  Medications  . HYDROcodone-homatropine (HYCODAN) 5-1.5 MG/5ML syrup    Sig: Take 5 mLs by mouth every 6 (six) hours as needed.    Dispense:  120 mL    Refill:  0  . amoxicillin (AMOXIL) 875 MG tablet    Sig: Take 1 tablet (875 mg total) by mouth 2 (two) times daily.    Dispense:  20 tablet    Refill:  0  .  benzonatate (TESSALON) 100 MG capsule    Sig: Take 1 capsule (100 mg total) by mouth 2 (two) times daily as needed for cough.    Dispense:  20 capsule    Refill:  0   Call w/ TC results, plan

## 2013-10-28 LAB — CULTURE, GROUP A STREP: ORGANISM ID, BACTERIA: NORMAL

## 2013-11-18 ENCOUNTER — Other Ambulatory Visit: Payer: Self-pay | Admitting: Internal Medicine

## 2013-12-24 ENCOUNTER — Other Ambulatory Visit: Payer: Self-pay | Admitting: Internal Medicine

## 2014-03-04 ENCOUNTER — Other Ambulatory Visit: Payer: Self-pay | Admitting: Internal Medicine

## 2014-04-21 ENCOUNTER — Ambulatory Visit (INDEPENDENT_AMBULATORY_CARE_PROVIDER_SITE_OTHER): Payer: 59 | Admitting: Emergency Medicine

## 2014-04-21 VITALS — BP 152/90 | HR 105 | Temp 97.8°F | Resp 18

## 2014-04-21 DIAGNOSIS — I10 Essential (primary) hypertension: Secondary | ICD-10-CM

## 2014-04-21 DIAGNOSIS — R040 Epistaxis: Secondary | ICD-10-CM

## 2014-04-21 NOTE — Patient Instructions (Signed)

## 2014-04-21 NOTE — Progress Notes (Signed)
[facility] [address], [place] [address] [phone]  Date:  04/21/2014   Name:  Daisy Bryant   DOB:  02/25/1930   MRN:  [id number]  PCP:  Olga Millers, MD    Chief Complaint: Epistaxis   History of Present Illness:  Daisy Bryant is a 81 y.o. very pleasant female patient who presents with the following:  Left nosebleed started this afternoon. No history of anticoagulant. Was watching TV No improvement with over the counter medications or other home remedies.  Denies other complaint or health concern today.   Patient Active Problem List   Diagnosis Date Noted  . Hiatal hernia 11/09/2011  . ANEMIA, IRON DEFICIENCY 04/22/2008  . PUD 04/17/2008  . PEPTIC STRICTURE 04/17/2008  . [facility] ALLERGY 04/17/2008  . CELIAC DISEASE 09/19/2007  . ADENOCARCINOMA, BREAST, RIGHT 03/13/2007  . HYPOTHYROIDISM 03/13/2007  . HYPERLIPIDEMIA 03/13/2007  . DEPRESSION 03/13/2007  . ATTENTION DEFICIT DISORDER 03/13/2007  . PERIPHERAL NEUROPATHY 03/13/2007  . HYPERTENSION 03/13/2007  . GASTROESOPHAGEAL REFLUX DISEASE 03/13/2007  . DIVERTICULOSIS, COLON 03/13/2007  . DEGENERATIVE JOINT DISEASE 03/13/2007    Past Medical History  Diagnosis Date  . Hypertension   . Hypothyroidism   . Depression   . Anxiety   . GERD (gastroesophageal reflux disease)   . H/O hiatal hernia 2002  . Neuromuscular disorder   . Cancer   . Arthritis   . Anemia   . Tuberculosis   . Acute gastric ulcer without mention of hemorrhage, perforation, or obstruction 2002  . Stricture and stenosis of esophagus 2002  . Diverticulosis of colon (without mention of hemorrhage)   . Colon polyp 2010    HYPERPLASTIC POLYP    Past Surgical History  Procedure Laterality Date  . Mastectomy    . Breast surgery    . Eye surgery    . Enteroscopy  11/09/2011    Procedure: ENTEROSCOPY;  Surgeon: Inda Castle, MD;  Location: [facility];  Service: Endoscopy;  Laterality: N/A;  . Joint  replacement Left     knee  . Tonsillectomy  under age 56    History  Substance Use Topics  . Smoking status: Never Smoker   . Smokeless tobacco: Never Used  . Alcohol Use: 0.5 oz/week    1 drink(s) per week     Comment: wine    Family History  Problem Relation Age of Onset  . Pancreatic cancer Father     Allergies  Allergen Reactions  . Esomeprazole Magnesium     REACTION: causes diarrhea  . Iodine     REACTION: itching    Medication list has been reviewed and updated.  Current Outpatient Prescriptions on File Prior to Visit  Medication Sig Dispense Refill  . amitriptyline (ELAVIL) 25 MG tablet TAKE 2 TABLETS AT BEDTIME 60 tablet 4  . citalopram (CELEXA) 20 MG tablet TAKE 1 TABLET EVERY DAY 90 tablet 3  . hydrochlorothiazide (HYDRODIURIL) 25 MG tablet TAKE 1 TABLET (25 MG TOTAL) BY MOUTH DAILY. 30 tablet 4  . levothyroxine (SYNTHROID, LEVOTHROID) 50 MCG tablet TAKE 1 TABLET EVERY DAY 30 tablet 4  . omeprazole (PRILOSEC) 40 MG capsule TAKE 1 CAPSULE BY MOUTH DAILY. 30 capsule 4  . simvastatin (ZOCOR) 20 MG tablet TAKE 1 TABLET (20 MG TOTAL) BY MOUTH EVERY EVENING. 30 tablet 4   No current facility-administered medications on file prior to visit.    Review of Systems:  As per HPI, otherwise negative.  Physical Examination: Filed Vitals:   04/21/14 1509  BP: 152/90  Pulse: 105  Temp: 97.8 F (36.6 C)  Resp: 18   There were no vitals filed for this visit. There is no weight on file to calculate BMI. Ideal Body Weight:     GEN: WDWN, NAD, Non-toxic, Alert & Oriented x 3 HEENT: Atraumatic, Normocephalic.  Ears and Nose: No external deformity.  Left nostril bleeding at [facility] plexus.  Moderate clots EXTR: No clubbing/cyanosis/edema NEURO: Normal gait.  PSYCH: Normally interactive. Conversant. Not depressed or anxious appearing.  Calm demeanor.    Assessment and Plan: Left anterior nosebleed Topical anesthesia with pontocaine spray Cautery with  silver nitrate Bleeding controlled   Signed,  Ellison Carwin, MD

## 2014-04-24 ENCOUNTER — Encounter: Payer: Self-pay | Admitting: Internal Medicine

## 2014-04-24 ENCOUNTER — Ambulatory Visit (INDEPENDENT_AMBULATORY_CARE_PROVIDER_SITE_OTHER): Payer: Medicare PPO | Admitting: Internal Medicine

## 2014-04-24 ENCOUNTER — Other Ambulatory Visit (INDEPENDENT_AMBULATORY_CARE_PROVIDER_SITE_OTHER): Payer: Medicare PPO

## 2014-04-24 VITALS — BP 108/80 | HR 70 | Temp 97.9°F | Resp 14 | Ht 66.0 in | Wt 236.5 lb

## 2014-04-24 DIAGNOSIS — R040 Epistaxis: Secondary | ICD-10-CM

## 2014-04-24 LAB — CBC WITH DIFFERENTIAL/PLATELET
BASOS ABS: 0 10*3/uL (ref 0.0–0.1)
Basophils Relative: 0.5 % (ref 0.0–3.0)
Eosinophils Absolute: 0.2 10*3/uL (ref 0.0–0.7)
Eosinophils Relative: 4.8 % (ref 0.0–5.0)
HCT: 38 % (ref 36.0–46.0)
HEMOGLOBIN: 12.9 g/dL (ref 12.0–15.0)
Lymphocytes Relative: 18.4 % (ref 12.0–46.0)
Lymphs Abs: 0.9 10*3/uL (ref 0.7–4.0)
MCHC: 33.9 g/dL (ref 30.0–36.0)
MCV: 91.8 fl (ref 78.0–100.0)
MONO ABS: 0.6 10*3/uL (ref 0.1–1.0)
Monocytes Relative: 12.2 % — ABNORMAL HIGH (ref 3.0–12.0)
NEUTROS PCT: 64.1 % (ref 43.0–77.0)
Neutro Abs: 3.1 10*3/uL (ref 1.4–7.7)
PLATELETS: 283 10*3/uL (ref 150.0–400.0)
RBC: 4.14 Mil/uL (ref 3.87–5.11)
RDW: 13.6 % (ref 11.5–15.5)
WBC: 4.8 10*3/uL (ref 4.0–10.5)

## 2014-04-24 NOTE — Progress Notes (Signed)
   Subjective:    Patient ID: Daisy Bryant, female    DOB: 09-14-1930, 81 y.o.   MRN: [id number]  HPI She was treated @ the urgent care 04/21/14 for epistaxis on the left which lasted 1 hour and required cauterization. She's had no recurrence of bleeding. She has no other bleeding dyscrasias. She denied any upper respiratory tract infection symptoms prior to the onset of symptoms.   Review of Systems Hemoptysis, hematuria, melena, or rectal bleeding denied. No unexplained weight loss, significant dyspepsia,dysphagia, or abdominal pain.  There is no abnormal bruising , bleeding, or difficulty stopping bleeding with injury. Frontal headache, facial pain , nasal purulence, dental pain, sore throat , otic pain or otic discharge denied. No fever , chills or sweats.     Objective:   Physical Exam Pertinent or positive findings include: She has wax in the right ear greater than the left.  There is eschar covering the left nasal septum. Erythema is noted of the right nasal septum. Abdomen is protuberant; she has an umbilical hernia. Posterior tibial pulses are decreased. She has lipedema of the lower extremities. There is crepitus of the knees with fusiform change.  General appearance :adequately nourished; in no distress. Eyes: No conjunctival inflammation or scleral icterus is present. Oral exam:  Lips and gums are healthy appearing.There is no oropharyngeal erythema or exudate noted. Dental hygiene is good. Heart:  Normal rate and regular rhythm. S1 and S2 normal without gallop, murmur, click, rub or other extra sounds  Lungs:Chest clear to auscultation; no wheezes, rhonchi,rales ,or rubs present.No increased work of breathing.  Abdomen: bowel sounds normal, soft and non-tender without masses, or organomegaly  noted.  No guarding or rebound. Vascular : all pulses equal ; no bruits present. Skin:Warm & dry.  Intact without suspicious lesions or rashes ; no tenting or jaundice  Lymphatic: No  lymphadenopathy is noted about the head, neck, axilla Neuro: Strength, tone normal.         Assessment & Plan:  31 epistaxis #2 rhinitis See orders

## 2014-04-24 NOTE — Patient Instructions (Addendum)
Nasal cleansing in the shower as discussed with lather of mild shampoo.After 10 seconds wash off lather while  exhaling through nostrils. Make sure that all residual soap is removed to prevent irritation.  Flonase OR Nasacort AQ 1 spray in each nostril twice a day as needed. Use the "crossover" technique into opposite nostril spraying toward opposite ear @ 45 degree angle, not straight up into nostril.    Your next office appointment will be determined based upon review of your pending labs.  Those instructions will be transmitted to you by My Chart   Critical results will be called.   Followup as needed for any active or acute issue. Please report any significant change in your symptoms.

## 2014-04-24 NOTE — Progress Notes (Signed)
Pre visit review using our clinic review tool, if applicable. No additional management support is needed unless otherwise documented below in the visit note. 

## 2014-05-04 ENCOUNTER — Ambulatory Visit (INDEPENDENT_AMBULATORY_CARE_PROVIDER_SITE_OTHER): Payer: 59 | Admitting: Emergency Medicine

## 2014-05-04 VITALS — BP 128/82 | HR 69 | Resp 18

## 2014-05-04 DIAGNOSIS — R040 Epistaxis: Secondary | ICD-10-CM

## 2014-05-04 DIAGNOSIS — I10 Essential (primary) hypertension: Secondary | ICD-10-CM

## 2014-05-04 NOTE — Patient Instructions (Signed)

## 2014-05-04 NOTE — Progress Notes (Signed)
[facility] [address], [place] [address] [phone]  Date:  05/04/2014   Name:  Daisy Bryant   DOB:  24-Nov-1930   MRN:  [id number]  PCP:  Olga Millers, MD    Chief Complaint: Epistaxis   History of Present Illness:  Daisy Bryant is a 81 y.o. very pleasant female patient who presents with the following:  Seen middle of last month with left anterior nosebleed Interval history negative Today had recurrent nose bleed following a sneeze. No anticoagulation. No improvement with over the counter medications or other home remedies.  Denies other complaint or health concern today.   Patient Active Problem List   Diagnosis Date Noted  . Hiatal hernia 11/09/2011  . ANEMIA, IRON DEFICIENCY 04/22/2008  . PUD 04/17/2008  . PEPTIC STRICTURE 04/17/2008  . [facility] ALLERGY 04/17/2008  . CELIAC DISEASE 09/19/2007  . ADENOCARCINOMA, BREAST, RIGHT 03/13/2007  . HYPOTHYROIDISM 03/13/2007  . HYPERLIPIDEMIA 03/13/2007  . DEPRESSION 03/13/2007  . ATTENTION DEFICIT DISORDER 03/13/2007  . PERIPHERAL NEUROPATHY 03/13/2007  . HYPERTENSION 03/13/2007  . GASTROESOPHAGEAL REFLUX DISEASE 03/13/2007  . DIVERTICULOSIS, COLON 03/13/2007  . DEGENERATIVE JOINT DISEASE 03/13/2007    Past Medical History  Diagnosis Date  . Hypertension   . Hypothyroidism   . Depression   . Anxiety   . GERD (gastroesophageal reflux disease)   . H/O hiatal hernia 2002  . Neuromuscular disorder   . Cancer   . Arthritis   . Anemia   . Tuberculosis   . Acute gastric ulcer without mention of hemorrhage, perforation, or obstruction 2002  . Stricture and stenosis of esophagus 2002  . Diverticulosis of colon (without mention of hemorrhage)   . Colon polyp 2010    HYPERPLASTIC POLYP    Past Surgical History  Procedure Laterality Date  . Mastectomy    . Breast surgery    . Eye surgery    . Enteroscopy  11/09/2011    Procedure: ENTEROSCOPY;  Surgeon: Inda Castle, MD;   Location: [facility];  Service: Endoscopy;  Laterality: N/A;  . Joint replacement Left     knee  . Tonsillectomy  under age 23    History  Substance Use Topics  . Smoking status: Never Smoker   . Smokeless tobacco: Never Used  . Alcohol Use: 0.5 oz/week    1 drink(s) per week     Comment: wine    Family History  Problem Relation Age of Onset  . Pancreatic cancer Father     Allergies  Allergen Reactions  . Esomeprazole Magnesium     REACTION: causes diarrhea  . Iodine     REACTION: itching    Medication list has been reviewed and updated.  Current Outpatient Prescriptions on File Prior to Visit  Medication Sig Dispense Refill  . amitriptyline (ELAVIL) 25 MG tablet TAKE 2 TABLETS AT BEDTIME 60 tablet 4  . citalopram (CELEXA) 20 MG tablet TAKE 1 TABLET EVERY DAY 90 tablet 3  . hydrochlorothiazide (HYDRODIURIL) 25 MG tablet TAKE 1 TABLET (25 MG TOTAL) BY MOUTH DAILY. 30 tablet 4  . levothyroxine (SYNTHROID, LEVOTHROID) 50 MCG tablet TAKE 1 TABLET EVERY DAY 30 tablet 4  . omeprazole (PRILOSEC) 40 MG capsule TAKE 1 CAPSULE BY MOUTH DAILY. 30 capsule 4  . simvastatin (ZOCOR) 20 MG tablet TAKE 1 TABLET (20 MG TOTAL) BY MOUTH EVERY EVENING. 30 tablet 4   No current facility-administered medications on file prior to visit.    Review of  Systems:  Review of Systems  Constitutional: Negative for fever, chills and fatigue.  HENT: Negative for congestion, ear pain, hearing loss, postnasal drip, rhinorrhea and sinus pressure.   Eyes: Negative for discharge and redness.  Respiratory: Negative for cough, shortness of breath and wheezing.   Cardiovascular: Negative for chest pain and leg swelling.  Gastrointestinal: Negative for nausea, vomiting, abdominal pain, constipation and blood in stool.  Genitourinary: Negative for dysuria, urgency and frequency.  Musculoskeletal: Negative for neck stiffness.  Skin: Negative for rash.  Neurological: Negative for seizures, weakness and  headaches.     Physical Examination: Filed Vitals:   05/04/14 1333  BP: 160/100  Pulse: 69  Resp: 18   There were no vitals filed for this visit. There is no weight on file to calculate BMI. Ideal Body Weight:     GEN: WDWN, NAD, Non-toxic, Alert & Oriented x 3 HEENT: Atraumatic, Normocephalic.  Ears and Nose: No external deformity. EXTR: No clubbing/cyanosis/edema NEURO: Normal gait.  PSYCH: Normally interactive. Conversant. Not depressed or anxious appearing.  Calm demeanor.  LEFT anterior septal nose bleed  Assessment and Plan: Cauterized under direct vision Anterior left nosebleed ENT   Signed Ellison Carwin, MD

## 2014-05-09 ENCOUNTER — Other Ambulatory Visit: Payer: Self-pay | Admitting: Internal Medicine

## 2014-08-22 ENCOUNTER — Encounter: Payer: Self-pay | Admitting: Gastroenterology

## 2014-09-18 ENCOUNTER — Other Ambulatory Visit: Payer: Self-pay | Admitting: Internal Medicine

## 2014-09-30 ENCOUNTER — Other Ambulatory Visit: Payer: Self-pay | Admitting: Geriatric Medicine

## 2014-09-30 MED ORDER — HYDROCHLOROTHIAZIDE 25 MG PO TABS: ORAL_TABLET | ORAL | Status: DC

## 2014-10-09 ENCOUNTER — Encounter: Payer: Self-pay | Admitting: Internal Medicine

## 2014-10-09 ENCOUNTER — Other Ambulatory Visit (INDEPENDENT_AMBULATORY_CARE_PROVIDER_SITE_OTHER): Payer: Medicare PPO

## 2014-10-09 ENCOUNTER — Ambulatory Visit (INDEPENDENT_AMBULATORY_CARE_PROVIDER_SITE_OTHER): Payer: Medicare PPO | Admitting: Internal Medicine

## 2014-10-09 VITALS — BP 140/80 | HR 88 | Temp 98.5°F | Resp 20 | Ht 67.0 in | Wt 235.4 lb

## 2014-10-09 DIAGNOSIS — D509 Iron deficiency anemia, unspecified: Secondary | ICD-10-CM

## 2014-10-09 DIAGNOSIS — D500 Iron deficiency anemia secondary to blood loss (chronic): Secondary | ICD-10-CM

## 2014-10-09 DIAGNOSIS — E039 Hypothyroidism, unspecified: Secondary | ICD-10-CM | POA: Diagnosis not present

## 2014-10-09 DIAGNOSIS — Z23 Encounter for immunization: Secondary | ICD-10-CM

## 2014-10-09 LAB — CBC
HCT: 40.6 % (ref 36.0–46.0)
Hemoglobin: 13.2 g/dL (ref 12.0–15.0)
MCHC: 32.5 g/dL (ref 30.0–36.0)
MCV: 92.4 fl (ref 78.0–100.0)
PLATELETS: 307 10*3/uL (ref 150.0–400.0)
RBC: 4.39 Mil/uL (ref 3.87–5.11)
RDW: 15.2 % (ref 11.5–15.5)
WBC: 6 10*3/uL (ref 4.0–10.5)

## 2014-10-09 LAB — COMPREHENSIVE METABOLIC PANEL
ALBUMIN: 4.2 g/dL (ref 3.5–5.2)
ALT: 37 U/L — ABNORMAL HIGH (ref 0–35)
AST: 39 U/L — ABNORMAL HIGH (ref 0–37)
Alkaline Phosphatase: 52 U/L (ref 39–117)
BUN: 18 mg/dL (ref 6–23)
CHLORIDE: 105 meq/L (ref 96–112)
CO2: 28 mEq/L (ref 19–32)
Calcium: 9.5 mg/dL (ref 8.4–10.5)
Creatinine, Ser: 1.02 mg/dL (ref 0.40–1.20)
GFR: 55.13 mL/min — ABNORMAL LOW (ref >60.00)
GLUCOSE: 97 mg/dL (ref 70–99)
POTASSIUM: 3.7 meq/L (ref 3.5–5.1)
SODIUM: 140 meq/L (ref 135–145)
Total Bilirubin: 1.1 mg/dL (ref 0.2–1.2)
Total Protein: 7.5 g/dL (ref 6.0–8.3)

## 2014-10-09 LAB — TSH: TSH: 4 u[IU]/mL (ref 0.35–4.50)

## 2014-10-09 LAB — T4, FREE: Free T4: 0.92 ng/dL (ref 0.60–1.60)

## 2014-10-09 NOTE — Patient Instructions (Signed)
We will check the blood work today for the blood counts and the thyroid to see what is causing the energy problem.   We have given you the flu shot and the prevnar (the pneumonia booster shot).

## 2014-10-09 NOTE — Progress Notes (Signed)
Pre visit review using our clinic review tool, if applicable. No additional management support is needed unless otherwise documented below in the visit note. 

## 2014-10-10 NOTE — Assessment & Plan Note (Signed)
Checking TSH and free T4 as her fatigue could also be related to her thyroid. Currently on 50 mcg daily synthroid and adjust as needed.

## 2014-10-10 NOTE — Assessment & Plan Note (Signed)
Checking CBC and ferritin for signs of worsening anemia or iron deficiency. No overt signs of bleeding. Not on her iron supplements anymore.

## 2014-10-10 NOTE — Progress Notes (Signed)
   Subjective:    Patient ID: Daisy Bryant, female    DOB: 05/22/1930, 81 y.o.   MRN: [id number]  HPI The patient is an 81 YO female coming in for tiredness. She does have history of iron deficiency anemia and feels similar to this time. She denies any recurrence of her nosebleeds. No blood in her stool or dark stools. Still taking her prilosec daily. Not taking any iron supplements. Mild dyspnea on exertion. No syncope or pre-syncope.   Review of Systems  Constitutional: Positive for activity change and fatigue. Negative for fever, appetite change and unexpected weight change.  Respiratory: Positive for shortness of breath. Negative for cough, chest tightness and wheezing.        Mild on exertion  Cardiovascular: Negative for chest pain, palpitations and leg swelling.  Endocrine: Negative for cold intolerance, heat intolerance, polydipsia, polyphagia and polyuria.  Musculoskeletal: Positive for arthralgias. Negative for myalgias, back pain, joint swelling, gait problem, neck pain and neck stiffness.  Neurological: Negative for dizziness, syncope, speech difficulty, weakness, light-headedness, numbness and headaches.  Psychiatric/Behavioral: Negative for suicidal ideas, sleep disturbance, dysphoric mood and agitation.      Objective:   Physical Exam  Constitutional: She is oriented to person, place, and time. She appears well-developed and well-nourished. No distress.  HENT:  Head: Normocephalic and atraumatic.  Eyes: EOM are normal. Pupils are equal, round, and reactive to light.  Neck: Normal range of motion. Neck supple. No JVD present. No tracheal deviation present. No thyromegaly present.  Cardiovascular: Normal rate and regular rhythm.   No murmur heard. Pulmonary/Chest: Effort normal and breath sounds normal. No respiratory distress. She has no wheezes. She has no rales. She exhibits no tenderness.  Abdominal: Soft. Bowel sounds are normal. She exhibits no distension. There is no  tenderness. There is no rebound and no guarding.  Musculoskeletal: She exhibits no tenderness.  Neurological: She is alert and oriented to person, place, and time. No cranial nerve deficit.  Skin: Skin is warm and dry. She is not diaphoretic.   Filed Vitals:   10/09/14 1449  BP: 140/80  Pulse: 88  Temp: 98.5 F (36.9 C)  TempSrc: Oral  Resp: 20  Height: 5\' 7"  (1.702 m)  Weight: 235 lb 6.4 oz (106.777 kg)  SpO2: 95%      Assessment & Plan:  Prevnar 13 and flu shot given at visit.

## 2014-10-14 ENCOUNTER — Other Ambulatory Visit: Payer: Self-pay | Admitting: Internal Medicine

## 2014-11-13 DIAGNOSIS — Z0000 Encounter for general adult medical examination without abnormal findings: Secondary | ICD-10-CM | POA: Diagnosis not present

## 2014-12-25 ENCOUNTER — Other Ambulatory Visit: Payer: Self-pay | Admitting: Internal Medicine

## 2015-02-12 ENCOUNTER — Other Ambulatory Visit: Payer: Self-pay | Admitting: Internal Medicine

## 2015-02-26 IMAGING — CR DG CHEST 2V
2 series · 2 of 2 positions shown · non-contrast
Comparison: None.

CLINICAL DATA: Cough, history of right-sided mastectomy

CHEST - 2 VIEW

[PA]
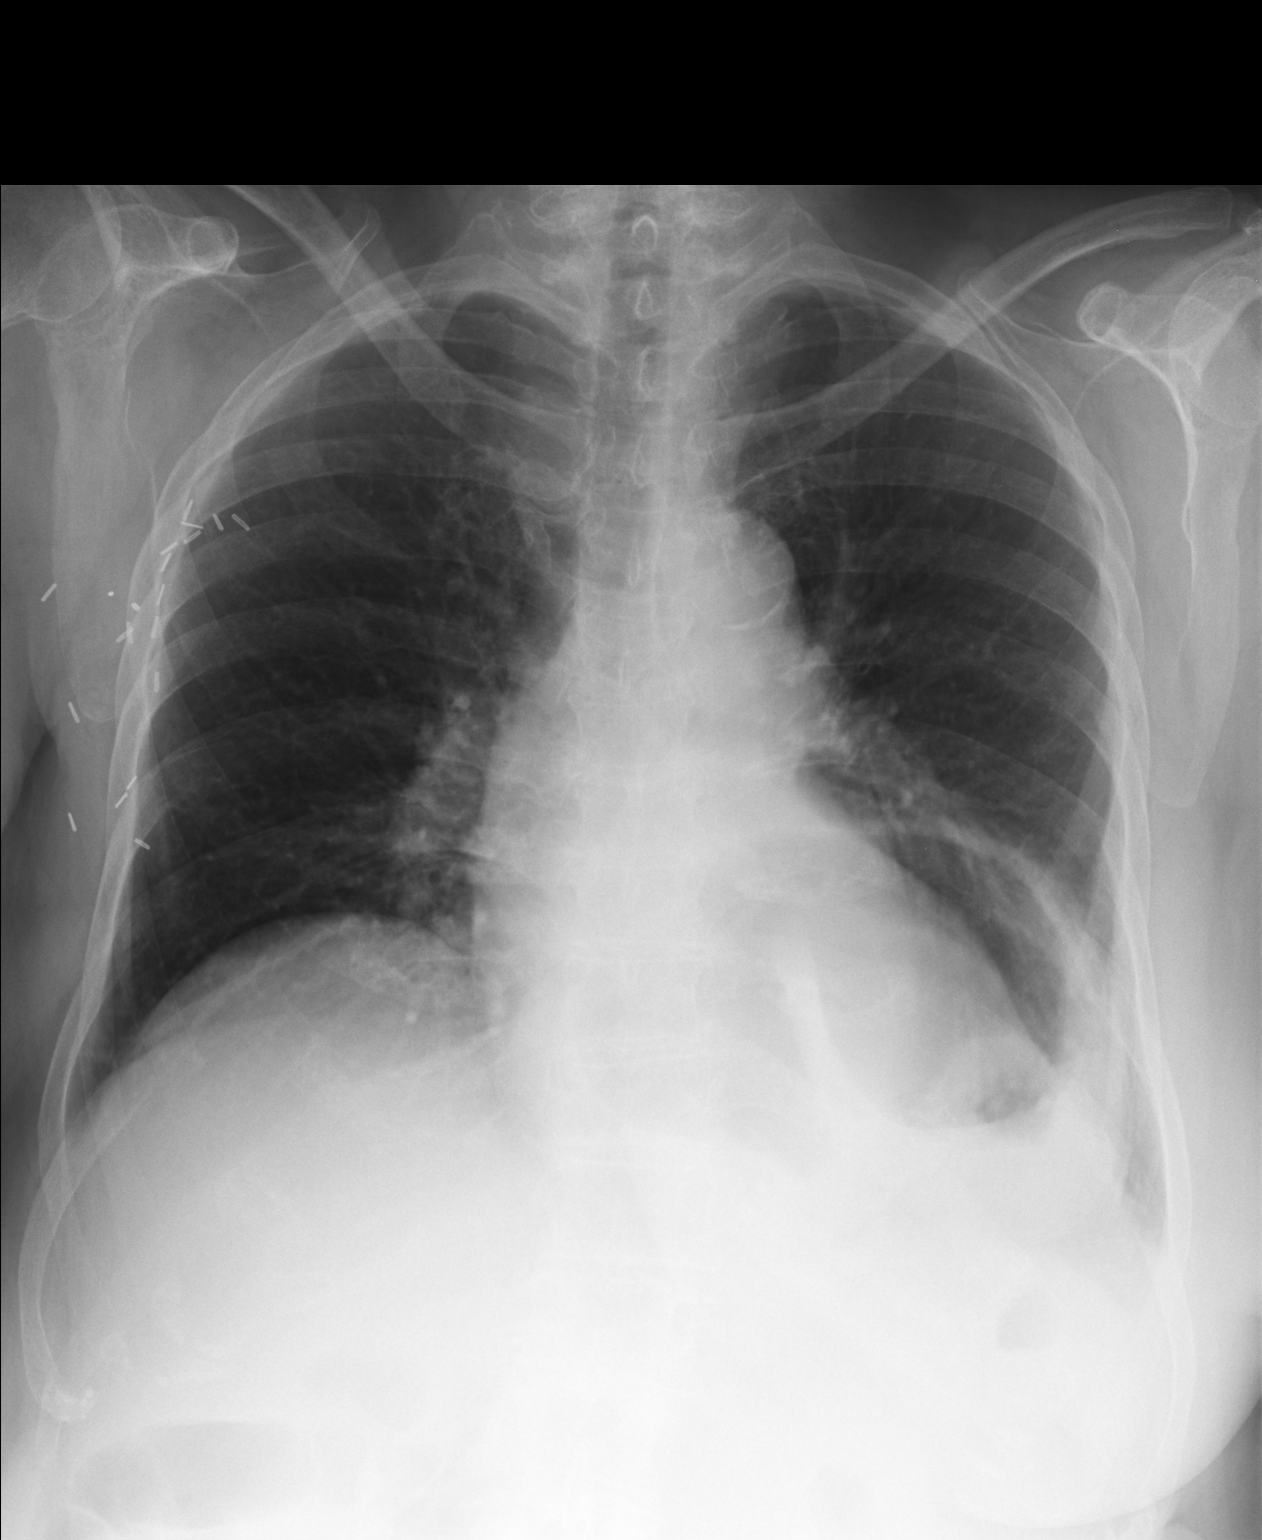

[lateral]
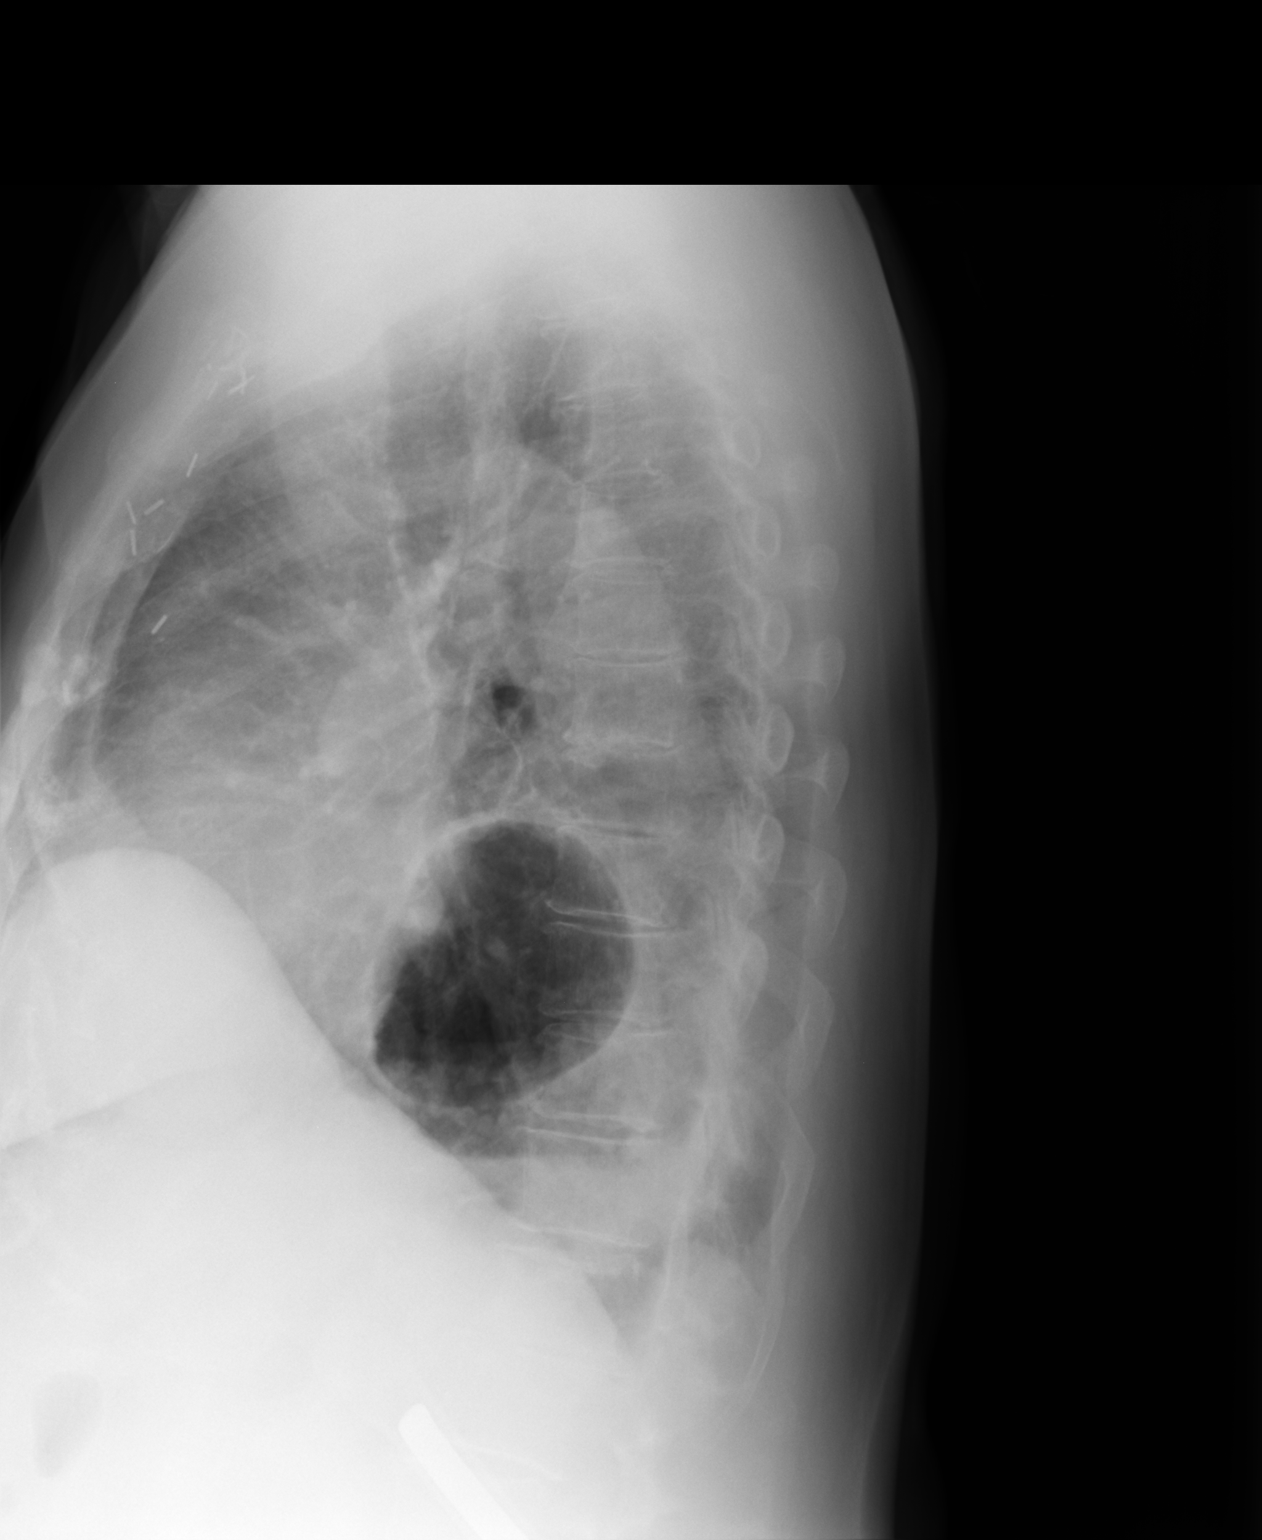

[2 of 2 positions shown; findings below may reference images not displayed]

FINDINGS: Borderline enlarged cardiac silhouette and mediastinal contours
with atherosclerotic calcifications within the thoracic aorta.
There is a large retrocardiac hiatal hernia with associated left
basilar heterogeneous opacities favored to represent atelectasis.
No definite pleural effusion.  No pneumothorax.  No evidence of
edema.  Post right-sided mastectomy with multiple right axillary
surgical clips.  No acute osseous abnormalities.
IMPRESSION: 1.  Large hiatal hernia without definite acute cardiopulmonary
disease.
2.  Post right-sided mastectomy and apparent right axillary
dissection.

Clinically significant discrepancy from primary report, if
provided: None

## 2015-03-09 ENCOUNTER — Other Ambulatory Visit: Payer: Self-pay | Admitting: Internal Medicine

## 2015-03-27 ENCOUNTER — Other Ambulatory Visit: Payer: Self-pay | Admitting: Internal Medicine

## 2015-04-01 ENCOUNTER — Other Ambulatory Visit (INDEPENDENT_AMBULATORY_CARE_PROVIDER_SITE_OTHER): Payer: Medicare Other

## 2015-04-01 ENCOUNTER — Ambulatory Visit (INDEPENDENT_AMBULATORY_CARE_PROVIDER_SITE_OTHER): Payer: Medicare Other | Admitting: Internal Medicine

## 2015-04-01 ENCOUNTER — Encounter: Payer: Self-pay | Admitting: Internal Medicine

## 2015-04-01 VITALS — BP 170/80 | HR 94 | Temp 98.4°F | Resp 18 | Ht 67.0 in | Wt 236.0 lb

## 2015-04-01 DIAGNOSIS — E785 Hyperlipidemia, unspecified: Secondary | ICD-10-CM | POA: Diagnosis not present

## 2015-04-01 DIAGNOSIS — I10 Essential (primary) hypertension: Secondary | ICD-10-CM | POA: Diagnosis not present

## 2015-04-01 DIAGNOSIS — Z23 Encounter for immunization: Secondary | ICD-10-CM | POA: Diagnosis not present

## 2015-04-01 DIAGNOSIS — E039 Hypothyroidism, unspecified: Secondary | ICD-10-CM

## 2015-04-01 LAB — LIPID PANEL
CHOLESTEROL: 150 mg/dL (ref 0–200)
HDL: 52.3 mg/dL (ref >39.00)
LDL Cholesterol: 62 mg/dL (ref 0–99)
NonHDL: 97.62
TRIGLYCERIDES: 180 mg/dL — AB (ref 0.0–149.0)
Total CHOL/HDL Ratio: 3
VLDL: 36 mg/dL (ref 0.0–40.0)

## 2015-04-01 LAB — CBC
HCT: 38.7 % (ref 36.0–46.0)
Hemoglobin: 12.9 g/dL (ref 12.0–15.0)
MCHC: 33.4 g/dL (ref 30.0–36.0)
MCV: 91.2 fl (ref 78.0–100.0)
Platelets: 294 10*3/uL (ref 150.0–400.0)
RBC: 4.24 Mil/uL (ref 3.87–5.11)
RDW: 14.1 % (ref 11.5–15.5)
WBC: 5.7 10*3/uL (ref 4.0–10.5)

## 2015-04-01 LAB — TSH: TSH: 4.29 u[IU]/mL (ref 0.35–4.50)

## 2015-04-01 NOTE — Assessment & Plan Note (Signed)
Checking lipid panel and adjust as needed. Taking simvastatin 20 mg daily and no side effects.

## 2015-04-01 NOTE — Assessment & Plan Note (Signed)
BP previously at goal and well controlled on this regimen. Continue HCTZ 25 mg daily and adjust as needed next visit. She will work on more exercise since the weather is now conducive to walking.

## 2015-04-01 NOTE — Patient Instructions (Signed)
We will check the labs today and call you back about the results.   Keep up the good work with your health and we will see you back for your physical in the fall.

## 2015-04-01 NOTE — Progress Notes (Signed)
   Subjective:    Patient ID: Daisy Bryant, female    DOB: [birth year], 82 y.o.   MRN: [id number]  HPI The patient is an 82 YO female coming in for follow up of her thyroid and blood pressure. She did not take her blood pressure meds this morning but faithfully takes them. BP at home is normal when she checks. No fatigue or constipation or diarrhea. No heat or cold intolerance.   Review of Systems  Constitutional: Negative for fever, activity change, appetite change, fatigue and unexpected weight change.  Respiratory: Negative for cough, chest tightness, shortness of breath and wheezing.        Mild on exertion  Cardiovascular: Negative for chest pain, palpitations and leg swelling.  Endocrine: Negative for cold intolerance, heat intolerance, polydipsia, polyphagia and polyuria.  Musculoskeletal: Positive for arthralgias. Negative for myalgias, back pain, joint swelling, gait problem, neck pain and neck stiffness.  Neurological: Negative for dizziness, syncope, speech difficulty, weakness, light-headedness, numbness and headaches.  Psychiatric/Behavioral: Negative for suicidal ideas, sleep disturbance, dysphoric mood and agitation.      Objective:   Physical Exam  Constitutional: She is oriented to person, place, and time. She appears well-developed and well-nourished.  HENT:  Head: Normocephalic and atraumatic.  Eyes: EOM are normal. Pupils are equal, round, and reactive to light.  Neck: Normal range of motion. Neck supple. No JVD present. No tracheal deviation present. No thyromegaly present.  Cardiovascular: Normal rate and regular rhythm.   No murmur heard. Pulmonary/Chest: Effort normal and breath sounds normal. No respiratory distress. She has no wheezes. She has no rales. She exhibits no tenderness.  Abdominal: Soft. Bowel sounds are normal. She exhibits no distension. There is no tenderness. There is no rebound and no guarding.  Musculoskeletal: She exhibits no tenderness.    Neurological: She is alert and oriented to person, place, and time. No cranial nerve deficit.  Skin: Skin is warm and dry.   Filed Vitals:   04/01/15 1121  BP: 170/80  Pulse: 94  Temp: 98.4 F (36.9 C)  TempSrc: Oral  Resp: 18  Height: 5\' 7"  (1.702 m)  Weight: 236 lb (107.049 kg)  SpO2: 94%      Assessment & Plan:  Tdap given at visit

## 2015-04-01 NOTE — Progress Notes (Signed)
Pre visit review using our clinic review tool, if applicable. No additional management support is needed unless otherwise documented below in the visit note. 

## 2015-04-01 NOTE — Assessment & Plan Note (Signed)
Checking TSH today and adjust as needed. Currently taking 50 mcg daily synthroid.

## 2015-04-09 ENCOUNTER — Ambulatory Visit: Payer: Medicare PPO | Admitting: Internal Medicine

## 2015-05-13 ENCOUNTER — Other Ambulatory Visit: Payer: Self-pay | Admitting: Internal Medicine

## 2015-06-11 ENCOUNTER — Other Ambulatory Visit: Payer: Self-pay | Admitting: Internal Medicine

## 2015-06-22 ENCOUNTER — Other Ambulatory Visit: Payer: Self-pay | Admitting: Internal Medicine

## 2015-07-04 ENCOUNTER — Other Ambulatory Visit: Payer: Self-pay | Admitting: Internal Medicine

## 2015-07-19 ENCOUNTER — Other Ambulatory Visit: Payer: Self-pay | Admitting: Internal Medicine

## 2015-07-22 ENCOUNTER — Telehealth: Payer: Self-pay

## 2015-07-22 NOTE — Telephone Encounter (Signed)
Patient is on the list for [employer] 2017 and may be a good candidate for an AWV in 2017. Please let me know if/when appt is scheduled.   

## 2015-08-24 ENCOUNTER — Other Ambulatory Visit (INDEPENDENT_AMBULATORY_CARE_PROVIDER_SITE_OTHER): Payer: Medicare Other

## 2015-08-24 ENCOUNTER — Encounter: Payer: Self-pay | Admitting: Internal Medicine

## 2015-08-24 ENCOUNTER — Ambulatory Visit (INDEPENDENT_AMBULATORY_CARE_PROVIDER_SITE_OTHER): Payer: Medicare Other | Admitting: Internal Medicine

## 2015-08-24 VITALS — BP 122/70 | HR 78 | Temp 98.1°F | Resp 18 | Ht 67.0 in | Wt 229.0 lb

## 2015-08-24 DIAGNOSIS — Z23 Encounter for immunization: Secondary | ICD-10-CM

## 2015-08-24 DIAGNOSIS — R5383 Other fatigue: Secondary | ICD-10-CM | POA: Diagnosis not present

## 2015-08-24 LAB — COMPREHENSIVE METABOLIC PANEL
ALK PHOS: 43 U/L (ref 39–117)
ALT: 19 U/L (ref 0–35)
AST: 21 U/L (ref 0–37)
Albumin: 4.3 g/dL (ref 3.5–5.2)
BILIRUBIN TOTAL: 1.2 mg/dL (ref 0.2–1.2)
BUN: 23 mg/dL (ref 6–23)
CALCIUM: 9.1 mg/dL (ref 8.4–10.5)
CO2: 28 mEq/L (ref 19–32)
CREATININE: 1.01 mg/dL (ref 0.40–1.20)
Chloride: 102 mEq/L (ref 96–112)
GFR: 55.64 mL/min — ABNORMAL LOW (ref >60.00)
GLUCOSE: 95 mg/dL (ref 70–99)
POTASSIUM: 3.8 meq/L (ref 3.5–5.1)
Sodium: 140 mEq/L (ref 135–145)
Total Protein: 7.5 g/dL (ref 6.0–8.3)

## 2015-08-24 LAB — VITAMIN B12: VITAMIN B 12: 166 pg/mL — AB (ref 211–911)

## 2015-08-24 LAB — TSH: TSH: 2.85 u[IU]/mL (ref 0.35–4.50)

## 2015-08-24 LAB — CBC
HCT: 41 % (ref 36.0–46.0)
Hemoglobin: 14 g/dL (ref 12.0–15.0)
MCHC: 34 g/dL (ref 30.0–36.0)
MCV: 92.1 fl (ref 78.0–100.0)
PLATELETS: 298 10*3/uL (ref 150.0–400.0)
RBC: 4.45 Mil/uL (ref 3.87–5.11)
RDW: 13.7 % (ref 11.5–15.5)
WBC: 6.9 10*3/uL (ref 4.0–10.5)

## 2015-08-24 LAB — T4, FREE: FREE T4: 0.82 ng/dL (ref 0.60–1.60)

## 2015-08-24 LAB — VITAMIN D 25 HYDROXY (VIT D DEFICIENCY, FRACTURES): VITD: 8.57 ng/mL — ABNORMAL LOW (ref 30.00–100.00)

## 2015-08-24 NOTE — Assessment & Plan Note (Signed)
Checking TSH and free T4 today and adjust her synthroid 50 mcg daily as needed.

## 2015-08-24 NOTE — Patient Instructions (Signed)
We have given you the flu shot today.   We will check the labs and call you back with the results.  

## 2015-08-24 NOTE — Progress Notes (Signed)
   Subjective:    Patient ID: Daisy Bryant, female    DOB: 1930/01/22, 82 y.o.   MRN: [id number]  HPI The patient is an 82 YO female coming in for feeling weak and tired. She is taking her thyroid medication and denies missing doses. This has been going on for weeks to months. Denies change in diet or exercise. She is sleeping well. Denies worsening symptoms of depression. She is seeing a counselor to stay on top of her symptoms and is doing well now. Denies blood loss or dark stools.   Review of Systems  Constitutional: Positive for fatigue. Negative for activity change, appetite change, fever and unexpected weight change.  Respiratory: Negative for cough, chest tightness, shortness of breath and wheezing.   Cardiovascular: Negative for chest pain, palpitations and leg swelling.  Gastrointestinal: Negative.   Endocrine: Negative for cold intolerance, heat intolerance, polydipsia, polyphagia and polyuria.  Musculoskeletal: Positive for arthralgias. Negative for back pain, gait problem, joint swelling, myalgias, neck pain and neck stiffness.  Neurological: Negative for dizziness, syncope, speech difficulty, weakness, light-headedness, numbness and headaches.  Psychiatric/Behavioral: Negative for agitation, dysphoric mood, sleep disturbance and suicidal ideas.      Objective:   Physical Exam  Constitutional: She is oriented to person, place, and time. She appears well-developed and well-nourished.  HENT:  Head: Normocephalic and atraumatic.  Eyes: EOM are normal. Pupils are equal, round, and reactive to light.  Neck: Normal range of motion. Neck supple. No JVD present. No thyromegaly present.  Cardiovascular: Normal rate and regular rhythm.   No murmur heard. Pulmonary/Chest: Effort normal and breath sounds normal. No respiratory distress. She has no wheezes. She has no rales. She exhibits no tenderness.  Abdominal: Soft. She exhibits no distension. There is no tenderness. There is no  rebound and no guarding.  Musculoskeletal: She exhibits no tenderness.  Neurological: She is alert and oriented to person, place, and time. No cranial nerve deficit.  Skin: Skin is warm and dry.   Vitals:   08/24/15 1351  BP: 122/70  Pulse: 78  Resp: 18  Temp: 98.1 F (36.7 C)  TempSrc: Oral  SpO2: 98%  Weight: 229 lb (103.9 kg)  Height: 5\' 7"  (1.702 m)      Assessment & Plan:  Flu shot given at visit.

## 2015-08-24 NOTE — Progress Notes (Signed)
Pre visit review using our clinic review tool, if applicable. No additional management support is needed unless otherwise documented below in the visit note. 

## 2015-08-24 NOTE — Assessment & Plan Note (Signed)
Checking labs for metabolic causes including CBC, B12, vit D, CMP, thyroid. Treat as indicated.

## 2015-08-24 NOTE — Assessment & Plan Note (Signed)
She is doing well on celexa and counseling.

## 2015-08-27 ENCOUNTER — Other Ambulatory Visit: Payer: Self-pay | Admitting: Internal Medicine

## 2015-08-27 MED ORDER — VITAMIN B-12 1000 MCG PO TABS: 1000.0000 ug | ORAL_TABLET | Freq: Every day | ORAL | 3 refills | Status: DC

## 2015-09-07 ENCOUNTER — Other Ambulatory Visit: Payer: Self-pay | Admitting: Internal Medicine

## 2015-09-08 ENCOUNTER — Telehealth: Payer: Self-pay

## 2015-09-08 NOTE — Telephone Encounter (Signed)
Call to Daisy Bryant to schedule her AWV. STated she was just in to see Dr. Sharlet Salina and coming back in October; will discuss AWV with Dr. Sharlet Salina, but doesn't have but a 15 minute apt. Will come in as planned early Oct and if Dr. Sharlet Salina and completed or direct her to schedule for AWV. Daisy Bryant agreed to talk to dr. Sharlet Salina

## 2015-10-05 ENCOUNTER — Ambulatory Visit: Payer: Medicare Other | Admitting: Internal Medicine

## 2015-10-06 ENCOUNTER — Encounter: Payer: Self-pay | Admitting: Internal Medicine

## 2015-10-06 ENCOUNTER — Ambulatory Visit (INDEPENDENT_AMBULATORY_CARE_PROVIDER_SITE_OTHER): Payer: Medicare Other | Admitting: Internal Medicine

## 2015-10-06 DIAGNOSIS — R5383 Other fatigue: Secondary | ICD-10-CM

## 2015-10-06 DIAGNOSIS — E538 Deficiency of other specified B group vitamins: Secondary | ICD-10-CM | POA: Insufficient documentation

## 2015-10-06 NOTE — Patient Instructions (Signed)
We have put in labs today that you can come get in about 6 weeks.   Keep taking the iron and B12 like you are.

## 2015-10-06 NOTE — Progress Notes (Signed)
Pre visit review using our clinic review tool, if applicable. No additional management support is needed unless otherwise documented below in the visit note. 

## 2015-10-06 NOTE — Progress Notes (Signed)
   Subjective:    Patient ID: Daisy Bryant, female    DOB: 1930/11/11, 82 y.o.   MRN: [id number]  HPI The patient is an 82 YO female coming in for follow up of her B12 deficiency. She started taking daily B12 pills. She has noticed a big difference in her energy levels. She is also taking iron. She denies any side effects such as constipation or bloating. She feels a lot better in the last month or so.   Review of Systems  Constitutional: Negative for activity change, appetite change, fatigue, fever and unexpected weight change.  Respiratory: Negative for cough, chest tightness, shortness of breath and wheezing.   Cardiovascular: Negative for chest pain, palpitations and leg swelling.  Gastrointestinal: Negative.   Endocrine: Negative for cold intolerance, heat intolerance, polydipsia, polyphagia and polyuria.  Musculoskeletal: Negative for arthralgias, gait problem and myalgias.  Skin: Negative.   Neurological: Negative for dizziness, syncope, speech difficulty, weakness, light-headedness, numbness and headaches.      Objective:   Physical Exam  Constitutional: She is oriented to person, place, and time. She appears well-developed and well-nourished.  HENT:  Head: Normocephalic and atraumatic.  Eyes: EOM are normal.  Neck: Normal range of motion. Neck supple. No JVD present. No thyromegaly present.  Cardiovascular: Normal rate and regular rhythm.   No murmur heard. Pulmonary/Chest: Effort normal and breath sounds normal. No respiratory distress. She has no wheezes. She has no rales. She exhibits no tenderness.  Abdominal: Soft. She exhibits no distension. There is no tenderness.  Neurological: She is alert and oriented to person, place, and time. Coordination normal.  Skin: Skin is warm and dry.   Vitals:   10/06/15 1035  BP: 130/60  Pulse: 85  Resp: 18  Temp: 98.2 F (36.8 C)  TempSrc: Oral  SpO2: 96%  Weight: 231 lb 1.9 oz (104.8 kg)  Height: 5\' 7"  (1.702 m)        Assessment & Plan:

## 2015-10-06 NOTE — Assessment & Plan Note (Signed)
Was due to vitamin B12 and D deficiency. She is feeling better now that she is on supplementation. Thyroid levels were normal.

## 2015-10-06 NOTE — Assessment & Plan Note (Signed)
She is feeling better with B12 supplement and iron supplement. Ordered level and 1-2 months she will come for labs.

## 2015-12-19 ENCOUNTER — Other Ambulatory Visit: Payer: Self-pay | Admitting: Internal Medicine

## 2015-12-19 ENCOUNTER — Other Ambulatory Visit: Payer: Self-pay | Admitting: Sports Medicine

## 2015-12-21 NOTE — Telephone Encounter (Signed)
Rx refill request

## 2016-01-05 ENCOUNTER — Other Ambulatory Visit: Payer: Self-pay | Admitting: Internal Medicine

## 2016-01-22 ENCOUNTER — Other Ambulatory Visit: Payer: Self-pay | Admitting: Internal Medicine

## 2016-02-15 ENCOUNTER — Telehealth: Payer: Self-pay | Admitting: Internal Medicine

## 2016-02-15 MED ORDER — OSELTAMIVIR PHOSPHATE 75 MG PO CAPS: 75.0000 mg | ORAL_CAPSULE | Freq: Two times a day (BID) | ORAL | 0 refills | Status: DC

## 2016-02-15 NOTE — Telephone Encounter (Signed)
Patient contacted and stated awareness 

## 2016-02-15 NOTE — Telephone Encounter (Signed)
Patient has been around someone with flu. She wanted to know if she needed to take tramiflu. She is out of town and would need it called in to...  [facility] [place] [phone]  [id number] - The number to connect the patient at.

## 2016-02-15 NOTE — Telephone Encounter (Signed)
[facility] can [facility] to any so [facility]. 1 pill twice a day for 5 days then stop.

## 2016-03-02 ENCOUNTER — Other Ambulatory Visit: Payer: Self-pay | Admitting: Internal Medicine

## 2016-03-14 ENCOUNTER — Ambulatory Visit: Payer: Medicare Other | Admitting: Internal Medicine

## 2016-03-21 ENCOUNTER — Ambulatory Visit (INDEPENDENT_AMBULATORY_CARE_PROVIDER_SITE_OTHER): Payer: Medicare Other | Admitting: Family Medicine

## 2016-03-21 VITALS — BP 138/70 | HR 81 | Temp 98.1°F | Resp 18 | Ht 67.0 in | Wt 237.2 lb

## 2016-03-21 DIAGNOSIS — S91332A Puncture wound without foreign body, left foot, initial encounter: Secondary | ICD-10-CM | POA: Diagnosis not present

## 2016-03-21 MED ORDER — CEPHALEXIN 500 MG PO CAPS: 500.0000 mg | ORAL_CAPSULE | Freq: Two times a day (BID) | ORAL | 0 refills | Status: DC

## 2016-03-21 NOTE — Progress Notes (Signed)
By signing my name below, I, [name], attest that this documentation has been prepared under the direction and in the presence of [name], MD.  Electronically Signed: [name], Medical Scribe. 03/21/16. 12:35 PM.  Subjective:    Patient ID: Daisy Bryant, female    DOB: [birth year], 83 y.o.   MRN: [id number]  HPI Chief Complaint  Patient presents with  . Foot Injury    step on carpet tack strip last wednesday , need tetnus shot     HPI Comments: Daisy Bryant is a 83 y.o. female who presents to the [facility] at [facility] complaining of left foot wound onset 5 days ago. Pt walked over the tack strip bare foot while in her kitchen when she injured herself. States her wound didn't bleed as much and it still hurts as much as it did day of onset. Pt has soaked her wound in alcohol, and showered since onset for relief of her sxs. Denies purulent drainage from wound.  Patient Active Problem List   Diagnosis Date Noted  . B12 deficiency 10/06/2015  . Fatigue 08/24/2015  . ANEMIA, IRON DEFICIENCY 04/22/2008  . PEPTIC STRICTURE 04/17/2008  . CELIAC DISEASE 09/19/2007  . ADENOCARCINOMA, BREAST, RIGHT 03/13/2007  . Hypothyroidism 03/13/2007  . Hyperlipidemia 03/13/2007  . Depression 03/13/2007  . PERIPHERAL NEUROPATHY 03/13/2007  . Essential hypertension 03/13/2007  . GASTROESOPHAGEAL REFLUX DISEASE 03/13/2007  . DEGENERATIVE JOINT DISEASE 03/13/2007   Past Medical History:  Diagnosis Date  . Acute gastric ulcer without mention of hemorrhage, perforation, or obstruction 2002  . Anemia   . Anxiety   . Arthritis   . Cancer ([place])   . Colon polyp 2010   HYPERPLASTIC POLYP  . Depression   . Diverticulosis of colon (without mention of hemorrhage)   . GERD (gastroesophageal reflux disease)   . H/O hiatal hernia 2002  . Hypertension   . Hypothyroidism   . Neuromuscular disorder ([place])   . Stricture and stenosis of esophagus 2002  .  Tuberculosis    Past Surgical History:  Procedure Laterality Date  . BREAST SURGERY    . ENTEROSCOPY  11/09/2011   Procedure: ENTEROSCOPY;  Surgeon: Inda Castle, MD;  Location: [facility] ENDOSCOPY;  Service: Endoscopy;  Laterality: N/A;  . EYE SURGERY    . JOINT REPLACEMENT Left    knee  . MASTECTOMY    . TONSILLECTOMY  under age 40   Allergies  Allergen Reactions  . Esomeprazole Magnesium     REACTION: causes diarrhea  . Iodine     REACTION: itching   Prior to Admission medications   Medication Sig Start Date End Date Taking? Authorizing Provider  amitriptyline (ELAVIL) 25 MG tablet TAKE 2 TABLETS AT BEDTIME 09/08/15  Yes Hoyt Koch, MD  citalopram (CELEXA) 20 MG tablet TAKE 1 TABLET EVERY DAY 01/05/16  Yes Hoyt Koch, MD  hydrochlorothiazide (HYDRODIURIL) 25 MG tablet TAKE 1 TABLET (25 MG TOTAL) BY MOUTH DAILY. 03/02/16  Yes Hoyt Koch, MD  levothyroxine (SYNTHROID, LEVOTHROID) 50 MCG tablet TAKE 1 TABLET EVERY DAY 12/21/15  Yes Hoyt Koch, MD  simvastatin (ZOCOR) 20 MG tablet TAKE 1 TABLET (20 MG TOTAL) BY MOUTH EVERY EVENING. 12/21/15  Yes Hoyt Koch, MD  hydrOXYzine (VISTARIL) 25 MG capsule Take 50 mg by mouth at bedtime. 07/24/15   Historical Provider, MD  levothyroxine (SYNTHROID, LEVOTHROID) 50 MCG tablet TAKE 1 TABLET EVERY DAY Patient not taking: Reported on 10/06/2015 05/13/15  Hoyt Koch, MD  levothyroxine (SYNTHROID, LEVOTHROID) 50 MCG tablet TAKE 1 TABLET EVERY DAY Patient not taking: Reported on 03/21/2016 01/22/16   Hoyt Koch, MD  nitroGLYCERIN (NITRODUR - DOSED IN MG/24 HR) 0.2 mg/hr patch APPLY 1/2 PATCH TO AFFECTED AREA DAILY. CHANGE PATCH DAILY AND SLIGHTLY ALTER LOCATION DAILY Patient not taking: Reported on 03/21/2016 12/21/15   Gerda Diss, DO  omeprazole (PRILOSEC) 40 MG capsule TAKE 1 CAPSULE BY MOUTH DAILY. Patient not taking: Reported on 10/06/2015 06/11/15   Hoyt Koch, MD  omeprazole  (PRILOSEC) 40 MG capsule TAKE 1 CAPSULE BY MOUTH DAILY. Patient not taking: Reported on 10/06/2015 06/22/15   Hoyt Koch, MD  oseltamivir (TAMIFLU) 75 MG capsule Take 1 capsule (75 mg total) by mouth 2 (two) times daily. Patient not taking: Reported on 03/21/2016 02/15/16   Hoyt Koch, MD  PREMARIN vaginal cream USE 1 APPLICATION DAILY FOR 10 DAYS, THEN TWICE WEEKLY ON AN ON-GOING BASIS Patient not taking: Reported on 10/06/2015 12/26/14   Hoyt Koch, MD  vitamin B-12 (CYANOCOBALAMIN) 1000 MCG tablet Take 1 tablet (1,000 mcg total) by mouth daily. Patient not taking: Reported on 03/21/2016 08/27/15   Hoyt Koch, MD   Social History   Social History  . Marital status: Widowed    Spouse name: N/A  . Number of children: 4  . Years of education: 4   Occupational History  . counseling -retired    Social History Main Topics  . Smoking status: Never Smoker  . Smokeless tobacco: Never Used  . Alcohol use 0.5 oz/week    1 drink(s) per week     Comment: wine  . Drug use: No  . Sexual activity: No   Other Topics Concern  . 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 Married '55 - 2 years/divorced, married '65 - 2007/widowed. 2 sons - '55, '69; 2 dtr - '56, '68; 3 grandchildren. LIves alone and is independent all ADLs.  ACP- no CPR,  OK for short term mechanical ventilation, no heroic or futile measure in the face of irreversible disease.    Review of Systems  Skin: Positive for wound.   Objective:  Physical Exam  Constitutional: She appears well-developed and well-nourished. No distress.  HENT:  Head: Normocephalic and atraumatic.  Eyes: Conjunctivae are normal.  Neck: Neck supple.  Cardiovascular: Normal rate.   Pulmonary/Chest: Effort normal.  Neurological: She is alert.  Skin: Skin is warm and dry.  2 small punctures and 1 slightly larger ulcerated puncture site  measuring 4 mm across No exudate Minimal surrounding erythema approx 5 mm from wound Surrounding soft tissue edema, only puncture wound themselves  Psychiatric: She has a normal mood and affect. Her behavior is normal.  Nursing note and vitals reviewed.   Vitals:   03/21/16 1226  BP: 138/70  Pulse: 81  Resp: 18  Temp: 98.1 F (36.7 C)  TempSrc: Oral  SpO2: 96%  Weight: 237 lb 3.2 oz (107.6 kg)  Height: 5\' 7"  (1.702 m)  Body mass index is 37.15 kg/m. Assessment & Plan:    LAQUIDA COTRELL is a 83 y.o. female Puncture wound of left foot, initial encounter  - 3 wounds seen, medial aspect with slight ulcerative appearance without exudate or significant induration/fluctuance. Minimal surrounding erythema. Will cover for infection with Keflex, but overall reassuring exam. Symptomatic care and wound cleansing discussed.  RTC precautions given. Tdap last year.  Meds ordered this encounter  Medications  . cephALEXin (KEFLEX) 500 MG capsule    Sig: Take 1 capsule (500 mg total) by mouth 2 (two) times daily.    Dispense:  20 capsule    Refill:  0   Patient Instructions   Although there does not appear to be significant infection at this time, I would like you to start Keflex one pill twice per day. Cleanse wound twice per day with soap and water, and can apply Polysporin or Vaseline over area with a dressing. If any increased redness, pain, or other signs of infection, return for recheck.  You are up-to-date based on my records for tetanus as you had one last year.  Return to the clinic or go to the nearest emergency room if any of your symptoms worsen or new symptoms occur.   Puncture Wound A puncture wound is an injury that is caused by a sharp, thin object that goes through (penetrates) your skin. Usually, a puncture wound does not leave a large opening in your skin, so it may not bleed a lot. However, when you get a puncture wound, dirt or other materials (foreign bodies) can be  forced into your wound and break off inside. This increases the chance of infection, such as tetanus. What are the causes? Puncture wounds are caused by any sharp, thin object that goes through your skin, such as:  Animal teeth, as with an animal bite.  Sharp, pointed objects, such as nails, splinters of glass, fishhooks, and needles. What are the signs or symptoms? Symptoms of a puncture wound include:  Pain.  Bleeding.  Swelling.  Bruising.  Fluid leaking from the wound.  Numbness, tingling, or loss of function. How is this diagnosed? This condition is diagnosed with a medical history and physical exam. Your wound will be checked to see if it contains any foreign bodies. You may also have X-rays or other imaging tests. How is this treated? Treatment for a puncture wound depends on how serious the wound is. It also depends on whether the wound contains any foreign bodies. Treatment for all types of puncture wounds usually starts with:  Controlling the bleeding.  Washing out the wound with a germ-free (sterile) salt-water solution.  Checking the wound for foreign bodies. Treatment may also include:  Having the wound opened surgically to remove a foreign object.  Closing the wound with stitches (sutures) if it continues to bleed.  Covering the wound with antibiotic ointments and a bandage (dressing).  Receiving a tetanus shot.  Receiving a rabies vaccine. Follow these instructions at home: Medicines   Take or apply over-the-counter and prescription medicines only as told by your health care provider.  If you were prescribed an antibiotic, take or apply it as told by your health care provider. Do not stop using the antibiotic even if your condition improves. Wound care   There are many ways to close and cover a wound. For example, a wound can be covered with sutures, skin glue, or adhesive strips.Follow instructions from your health care provider about:  How to take  care of your wound.  When and how you should change your dressing.  When you should remove your dressing.  Removing whatever was used to close your wound.  Keep the dressing dry as told by your health care provider. Do not take baths, swim, use a hot tub, or do anything that would put your wound underwater until your health care  provider approves.  Clean the wound as told by your health care provider.  Do not scratch or pick at the wound.  Check your wound every day for signs of infection. Watch for:  Redness, swelling, or pain.  Fluid, blood, or pus. General instructions   Raise (elevate) the injured area above the level of your heart while you are sitting or lying down.  If your puncture wound is in your foot, ask your health care provider if you need to avoid putting weight on your foot and for how long.  Keep all follow-up visits as told by your health care provider. This is important. Contact a health care provider if:  You received a tetanus shot and you have swelling, severe pain, redness, or bleeding at the injection site.  You have a fever.  Your sutures come out.  You notice a bad smell coming from your wound or your dressing.  You notice something coming out of your wound, such as wood or glass.  Your pain is not controlled with medicine.  You have increased redness, swelling, or pain at the site of your wound.  You have fluid, blood, or pus coming from your wound.  You notice a change in the color of your skin near your wound.  You need to change the dressing frequently due to fluid, blood, or pus draining from your wound.  You develop a new rash.  You develop numbness around your wound. Get help right away if:  You develop severe swelling around your wound.  Your pain suddenly increases and is severe.  You develop painful skin lumps.  You have a red streak going away from your wound.  The wound is on your hand or foot and you cannot properly  move a finger or toe.  The wound is on your hand or foot and you notice that your fingers or toes look pale or bluish. This information is not intended to replace advice given to you by your health care provider. Make sure you discuss any questions you have with your health care provider. Document Released: 09/29/2004 Document Revised: 05/25/2015 Document Reviewed: 02/12/2014 Elsevier Interactive Patient Education  2017 [employer].   IF you received an x-ray today, you will receive an invoice from [facility]. Please contact [facility] at [phone] with questions or concerns regarding your invoice.   IF you received labwork today, you will receive an invoice from [facility]. Please contact [facility] at [phone] with questions or concerns regarding your invoice.   Our billing staff will not be able to assist you with questions regarding bills from these companies.  You will be contacted with the lab results as soon as they are available. The fastest way to get your results is to activate your My Chart account. Instructions are located on the last page of this paperwork. If you have not heard from [place] regarding the results in 2 weeks, please contact this office.       I personally performed the services described in this documentation, which was scribed in my presence. The recorded information has been reviewed and considered for accuracy and completeness, addended by me as needed, and agree with information above.  Signed,   [name], MD [facility] [place].  03/21/16 12:45 PM

## 2016-03-21 NOTE — Patient Instructions (Addendum)
Although there does not appear to be significant infection at this time, I would like you to start Keflex one pill twice per day. Cleanse wound twice per day with soap and water, and can apply Polysporin or Vaseline over area with a dressing. If any increased redness, pain, or other signs of infection, return for recheck.  You are up-to-date based on my records for tetanus as you had one last year.  Return to the clinic or go to the nearest emergency room if any of your symptoms worsen or new symptoms occur.   Puncture Wound A puncture wound is an injury that is caused by a sharp, thin object that goes through (penetrates) your skin. Usually, a puncture wound does not leave a large opening in your skin, so it may not bleed a lot. However, when you get a puncture wound, dirt or other materials (foreign bodies) can be forced into your wound and break off inside. This increases the chance of infection, such as tetanus. What are the causes? Puncture wounds are caused by any sharp, thin object that goes through your skin, such as:  Animal teeth, as with an animal bite.  Sharp, pointed objects, such as nails, splinters of glass, fishhooks, and needles. What are the signs or symptoms? Symptoms of a puncture wound include:  Pain.  Bleeding.  Swelling.  Bruising.  Fluid leaking from the wound.  Numbness, tingling, or loss of function. How is this diagnosed? This condition is diagnosed with a medical history and physical exam. Your wound will be checked to see if it contains any foreign bodies. You may also have X-rays or other imaging tests. How is this treated? Treatment for a puncture wound depends on how serious the wound is. It also depends on whether the wound contains any foreign bodies. Treatment for all types of puncture wounds usually starts with:  Controlling the bleeding.  Washing out the wound with a germ-free (sterile) salt-water solution.  Checking the wound for foreign  bodies. Treatment may also include:  Having the wound opened surgically to remove a foreign object.  Closing the wound with stitches (sutures) if it continues to bleed.  Covering the wound with antibiotic ointments and a bandage (dressing).  Receiving a tetanus shot.  Receiving a rabies vaccine. Follow these instructions at home: Medicines   Take or apply over-the-counter and prescription medicines only as told by your health care provider.  If you were prescribed an antibiotic, take or apply it as told by your health care provider. Do not stop using the antibiotic even if your condition improves. Wound care   There are many ways to close and cover a wound. For example, a wound can be covered with sutures, skin glue, or adhesive strips.Follow instructions from your health care provider about:  How to take care of your wound.  When and how you should change your dressing.  When you should remove your dressing.  Removing whatever was used to close your wound.  Keep the dressing dry as told by your health care provider. Do not take baths, swim, use a hot tub, or do anything that would put your wound underwater until your health care provider approves.  Clean the wound as told by your health care provider.  Do not scratch or pick at the wound.  Check your wound every day for signs of infection. Watch for:  Redness, swelling, or pain.  Fluid, blood, or pus. General instructions   Raise (elevate) the injured area above the level  of your heart while you are sitting or lying down.  If your puncture wound is in your foot, ask your health care provider if you need to avoid putting weight on your foot and for how long.  Keep all follow-up visits as told by your health care provider. This is important. Contact a health care provider if:  You received a tetanus shot and you have swelling, severe pain, redness, or bleeding at the injection site.  You have a fever.  Your  sutures come out.  You notice a bad smell coming from your wound or your dressing.  You notice something coming out of your wound, such as wood or glass.  Your pain is not controlled with medicine.  You have increased redness, swelling, or pain at the site of your wound.  You have fluid, blood, or pus coming from your wound.  You notice a change in the color of your skin near your wound.  You need to change the dressing frequently due to fluid, blood, or pus draining from your wound.  You develop a new rash.  You develop numbness around your wound. Get help right away if:  You develop severe swelling around your wound.  Your pain suddenly increases and is severe.  You develop painful skin lumps.  You have a red streak going away from your wound.  The wound is on your hand or foot and you cannot properly move a finger or toe.  The wound is on your hand or foot and you notice that your fingers or toes look pale or bluish. This information is not intended to replace advice given to you by your health care provider. Make sure you discuss any questions you have with your health care provider. Document Released: 09/29/2004 Document Revised: 05/25/2015 Document Reviewed: 02/12/2014 Elsevier Interactive Patient Education  2017 [employer].   IF you received an x-ray today, you will receive an invoice from [facility]. Please contact [facility] at [phone] with questions or concerns regarding your invoice.   IF you received labwork today, you will receive an invoice from [facility]. Please contact [facility] at [phone] with questions or concerns regarding your invoice.   Our billing staff will not be able to assist you with questions regarding bills from these companies.  You will be contacted with the lab results as soon as they are available. The fastest way to get your results is to activate your My Chart account. Instructions are located on the last  page of this paperwork. If you have not heard from [place] regarding the results in 2 weeks, please contact this office.

## 2016-03-29 ENCOUNTER — Other Ambulatory Visit: Payer: Self-pay | Admitting: Internal Medicine

## 2016-04-07 ENCOUNTER — Other Ambulatory Visit: Payer: Self-pay | Admitting: Internal Medicine

## 2016-04-21 ENCOUNTER — Telehealth: Payer: Self-pay

## 2016-04-21 NOTE — Telephone Encounter (Signed)
PA started for Amitriptyline  Key : [id number]

## 2016-05-18 ENCOUNTER — Encounter: Payer: Self-pay | Admitting: Internal Medicine

## 2016-05-18 ENCOUNTER — Ambulatory Visit (INDEPENDENT_AMBULATORY_CARE_PROVIDER_SITE_OTHER): Payer: Medicare Other | Admitting: Internal Medicine

## 2016-05-18 VITALS — BP 144/82 | HR 94 | Temp 98.6°F | Resp 14 | Ht 67.0 in | Wt 238.0 lb

## 2016-05-18 DIAGNOSIS — E538 Deficiency of other specified B group vitamins: Secondary | ICD-10-CM

## 2016-05-18 DIAGNOSIS — E039 Hypothyroidism, unspecified: Secondary | ICD-10-CM

## 2016-05-18 MED ORDER — CITALOPRAM HYDROBROMIDE 20 MG PO TABS: 20.0000 mg | ORAL_TABLET | Freq: Every day | ORAL | 3 refills | Status: DC

## 2016-05-18 MED ORDER — LEVOTHYROXINE SODIUM 50 MCG PO TABS: 50.0000 ug | ORAL_TABLET | Freq: Every day | ORAL | 3 refills | Status: DC

## 2016-05-18 MED ORDER — HYDROCHLOROTHIAZIDE 25 MG PO TABS: ORAL_TABLET | ORAL | 3 refills | Status: DC

## 2016-05-18 NOTE — Progress Notes (Signed)
   Subjective:    Patient ID: Daisy Bryant, female    DOB: 05-Jul-1930, 83 y.o.   MRN: [id number]  HPI The patient is an 83 YO female coming in for follow up of her B12 deficiency (started taking oral replacement, not having the numbness that she previously was having, denies side effects), her thyroid (still taking her synthroid at least 30 minutes from other meds and food, denies tremors, weakness, constipation, diarrhea).  Review of Systems  Constitutional: Negative.   Respiratory: Negative for cough, chest tightness and shortness of breath.   Cardiovascular: Negative for chest pain, palpitations and leg swelling.  Gastrointestinal: Negative for abdominal distention, abdominal pain, constipation, diarrhea, nausea and vomiting.  Skin: Negative.   Neurological: Negative.       Objective:   Physical Exam  Constitutional: She is oriented to person, place, and time. She appears well-developed and well-nourished.  HENT:  Head: Normocephalic and atraumatic.  Eyes: EOM are normal.  Neck: Normal range of motion.  Cardiovascular: Normal rate and regular rhythm.   Pulmonary/Chest: Effort normal and breath sounds normal. No respiratory distress. She has no wheezes. She has no rales.  Abdominal: Soft. She exhibits no distension. There is no tenderness. There is no rebound.  Neurological: She is alert and oriented to person, place, and time. Coordination normal.  Skin: Skin is warm and dry.   Vitals:   05/18/16 1101  BP: (!) 144/82  Pulse: 94  Resp: 14  Temp: 98.6 F (37 C)  TempSrc: Oral  SpO2: 98%  Weight: 238 lb (108 kg)  Height: 5\' 7"  (1.702 m)      Assessment & Plan:

## 2016-05-18 NOTE — Patient Instructions (Signed)
We will check the labs today and have sent in the refills ? ?

## 2016-05-19 NOTE — Assessment & Plan Note (Signed)
Checking B12 and if no improvement she will need injections or intranasal B12. Taking oral daily for adequate time now.

## 2016-05-19 NOTE — Assessment & Plan Note (Signed)
Checking TSH and free T4 and adjust her synthoid 50 mcg daily as needed.

## 2016-05-20 ENCOUNTER — Other Ambulatory Visit (INDEPENDENT_AMBULATORY_CARE_PROVIDER_SITE_OTHER): Payer: Medicare Other

## 2016-05-20 DIAGNOSIS — E538 Deficiency of other specified B group vitamins: Secondary | ICD-10-CM | POA: Diagnosis not present

## 2016-05-20 DIAGNOSIS — E039 Hypothyroidism, unspecified: Secondary | ICD-10-CM

## 2016-05-20 LAB — COMPREHENSIVE METABOLIC PANEL
ALT: 21 U/L (ref 0–35)
AST: 25 U/L (ref 0–37)
Albumin: 4.2 g/dL (ref 3.5–5.2)
Alkaline Phosphatase: 44 U/L (ref 39–117)
BILIRUBIN TOTAL: 1 mg/dL (ref 0.2–1.2)
BUN: 20 mg/dL (ref 6–23)
CO2: 30 meq/L (ref 19–32)
Calcium: 9.8 mg/dL (ref 8.4–10.5)
Chloride: 103 mEq/L (ref 96–112)
Creatinine, Ser: 1.09 mg/dL (ref 0.40–1.20)
GFR: 50.86 mL/min — AB (ref >60.00)
GLUCOSE: 97 mg/dL (ref 70–99)
Potassium: 4.3 mEq/L (ref 3.5–5.1)
SODIUM: 140 meq/L (ref 135–145)
Total Protein: 7.3 g/dL (ref 6.0–8.3)

## 2016-05-20 LAB — T4, FREE: Free T4: 0.83 ng/dL (ref 0.60–1.60)

## 2016-05-20 LAB — CBC
HCT: 40.2 % (ref 36.0–46.0)
HEMOGLOBIN: 13.5 g/dL (ref 12.0–15.0)
MCHC: 33.5 g/dL (ref 30.0–36.0)
MCV: 96.3 fl (ref 78.0–100.0)
PLATELETS: 273 10*3/uL (ref 150.0–400.0)
RBC: 4.18 Mil/uL (ref 3.87–5.11)
RDW: 13.3 % (ref 11.5–15.5)
WBC: 5.8 10*3/uL (ref 4.0–10.5)

## 2016-05-20 LAB — TSH: TSH: 5.07 u[IU]/mL — ABNORMAL HIGH (ref 0.35–4.50)

## 2016-05-20 LAB — VITAMIN B12: VITAMIN B 12: 463 pg/mL (ref 211–911)

## 2016-09-02 ENCOUNTER — Other Ambulatory Visit: Payer: Self-pay | Admitting: Internal Medicine

## 2016-09-25 ENCOUNTER — Other Ambulatory Visit: Payer: Self-pay | Admitting: Internal Medicine

## 2016-10-09 ENCOUNTER — Other Ambulatory Visit: Payer: Self-pay | Admitting: Internal Medicine

## 2016-10-24 ENCOUNTER — Encounter: Payer: Self-pay | Admitting: Internal Medicine

## 2016-10-24 ENCOUNTER — Other Ambulatory Visit (INDEPENDENT_AMBULATORY_CARE_PROVIDER_SITE_OTHER): Payer: Medicare Other

## 2016-10-24 ENCOUNTER — Ambulatory Visit (INDEPENDENT_AMBULATORY_CARE_PROVIDER_SITE_OTHER): Payer: Medicare Other | Admitting: Internal Medicine

## 2016-10-24 VITALS — BP 118/72 | HR 79 | Temp 97.9°F | Ht 67.0 in | Wt 230.0 lb

## 2016-10-24 DIAGNOSIS — D509 Iron deficiency anemia, unspecified: Secondary | ICD-10-CM

## 2016-10-24 DIAGNOSIS — Z23 Encounter for immunization: Secondary | ICD-10-CM

## 2016-10-24 DIAGNOSIS — D649 Anemia, unspecified: Secondary | ICD-10-CM

## 2016-10-24 LAB — CBC
HCT: 43.6 % (ref 36.0–46.0)
HEMOGLOBIN: 14.8 g/dL (ref 12.0–15.0)
MCHC: 33.9 g/dL (ref 30.0–36.0)
MCV: 97.4 fl (ref 78.0–100.0)
PLATELETS: 267 10*3/uL (ref 150.0–400.0)
RBC: 4.47 Mil/uL (ref 3.87–5.11)
RDW: 13.5 % (ref 11.5–15.5)
WBC: 5.6 10*3/uL (ref 4.0–10.5)

## 2016-10-24 LAB — FERRITIN: FERRITIN: 113.1 ng/mL (ref 10.0–291.0)

## 2016-10-24 LAB — VITAMIN B12: Vitamin B-12: 379 pg/mL (ref 211–911)

## 2016-10-24 NOTE — Patient Instructions (Signed)
We are checking the labs today and will call you back about the results.   We have given you the flu shot today.

## 2016-10-24 NOTE — Progress Notes (Signed)
   Subjective:    Patient ID: Daisy Bryant, female    DOB: [birth year], 83 y.o.   MRN: [id number]  HPI The patient is an 83 YO female coming in for fatigue and low energy. Some SOB with exertion because she feels she does not have the energy. Last time she felt like this she was having low blood levels. Some dark stools recently but not tarry as she has had in the past. She denies nosebleed or dark sputum. She denies bruising. She denies recent fall or head injury. She has been taking her PPI still without missing doses. No abdominal pain or diarrhea or constipation.   Review of Systems  Constitutional: Positive for activity change and fatigue. Negative for appetite change, chills, fever and unexpected weight change.  HENT: Negative.   Eyes: Negative.   Respiratory: Negative for cough, chest tightness and shortness of breath.   Cardiovascular: Negative for chest pain, palpitations and leg swelling.  Gastrointestinal: Positive for blood in stool. Negative for abdominal distention, abdominal pain, anal bleeding, constipation, diarrhea, nausea and vomiting.  Musculoskeletal: Negative.   Skin: Negative.   Neurological: Negative.   Hematological: Negative for adenopathy. Does not bruise/bleed easily.  Psychiatric/Behavioral: Negative.       Objective:   Physical Exam  Constitutional: She is oriented to person, place, and time. She appears well-developed and well-nourished.  HENT:  Head: Normocephalic and atraumatic.  Eyes: EOM are normal.  Neck: Normal range of motion.  Cardiovascular: Normal rate and regular rhythm.   Pulmonary/Chest: Effort normal and breath sounds normal. No respiratory distress. She has no wheezes. She has no rales.  Abdominal: Soft. Bowel sounds are normal. She exhibits no distension. There is no tenderness. There is no rebound.  Neurological: She is alert and oriented to person, place, and time. Coordination normal.  Skin: Skin is warm and dry.   Vitals:   10/24/16  1525  BP: 118/72  Pulse: 79  Temp: 97.9 F (36.6 C)  TempSrc: Oral  SpO2: 99%  Weight: 230 lb (104.3 kg)  Height: 5\' 7"  (1.702 m)      Assessment & Plan:  Flu shot given at visit

## 2016-10-25 NOTE — Assessment & Plan Note (Signed)
Concern for recurrent GI loss with the dark stools. She does have prior stomach ulcers as well as diverticulosis bleed previously. Checking CBC, ferritin, B12 and adjust as needed. If change from prior will ask her to follow up with GI as well.

## 2016-10-31 ENCOUNTER — Other Ambulatory Visit: Payer: Self-pay | Admitting: Internal Medicine

## 2016-11-01 ENCOUNTER — Other Ambulatory Visit: Payer: Self-pay | Admitting: Internal Medicine

## 2017-01-26 ENCOUNTER — Other Ambulatory Visit: Payer: Self-pay | Admitting: Internal Medicine

## 2017-02-16 ENCOUNTER — Telehealth: Payer: Self-pay

## 2017-02-16 NOTE — Telephone Encounter (Signed)
PA started on CoverMyMeds KEY: [id number]  PA approved till12/31/2019

## 2017-03-14 ENCOUNTER — Telehealth: Payer: Self-pay | Admitting: Internal Medicine

## 2017-03-14 NOTE — Telephone Encounter (Signed)
Signed, Copy sent to scan & Original mailed to patient has requested.

## 2017-03-14 NOTE — Telephone Encounter (Signed)
We have received a parking placard form in the mail for the patient. Form has been placed in providers box to review and sign.

## 2017-04-13 ENCOUNTER — Other Ambulatory Visit: Payer: Self-pay | Admitting: Internal Medicine

## 2017-05-02 ENCOUNTER — Ambulatory Visit: Payer: Medicare Other | Admitting: Internal Medicine

## 2017-05-02 ENCOUNTER — Encounter: Payer: Self-pay | Admitting: Internal Medicine

## 2017-05-02 DIAGNOSIS — M79672 Pain in left foot: Secondary | ICD-10-CM

## 2017-05-02 NOTE — Assessment & Plan Note (Signed)
Use vaseline for moisture and gauze for padding in her shoe. Advised to wear shoes at all time. Use hammer to pound flat the tacks so repeat incident avoided. Call back for no healing in 1-2 weeks. No indication for antibiotics or imaging today. No retained fragments during exam today.

## 2017-05-02 NOTE — Patient Instructions (Signed)
You can use vaseline on the area to help it heal better and use some guaze to protect it some while walking.   Use shoes while walking until this is healed.

## 2017-05-02 NOTE — Progress Notes (Signed)
   Subjective:    Patient ID: Daisy Bryant, female    DOB: 1930-03-29, 84 y.o.   MRN: [id number]  HPI The patient is an 84 YO female coming in for foot pain. Stepped on a tack possibly last week. She is worried about infection. It is still hurting some so she came in. She is worried as her husband had an infection on his leg which went septic. She denies fall at the time. She walked over a bare spot on the carpet which had some tacks sticking out. She is able to walk on it but with some pain.   Review of Systems  Constitutional: Negative.   Respiratory: Negative for cough, chest tightness and shortness of breath.   Cardiovascular: Negative for chest pain, palpitations and leg swelling.  Gastrointestinal: Negative for abdominal distention, abdominal pain, constipation, diarrhea, nausea and vomiting.  Musculoskeletal: Positive for gait problem and myalgias.  Skin: Positive for wound.      Objective:   Physical Exam  Constitutional: She is oriented to person, place, and time. She appears well-developed and well-nourished.  HENT:  Head: Normocephalic and atraumatic.  Eyes: EOM are normal.  Neck: Normal range of motion.  Cardiovascular: Normal rate.  Pulmonary/Chest: Effort normal.  Abdominal: Soft.  Musculoskeletal: She exhibits no edema.  Neurological: She is alert and oriented to person, place, and time. Coordination normal.  Skin: Skin is warm and dry.  Fissure on the heel of the left foot without abscess or purulence, dry skin   Vitals:   05/02/17 0826  BP: 140/70  Pulse: 71  Temp: 97.8 F (36.6 C)  TempSrc: Oral  SpO2: 97%  Weight: 224 lb (101.6 kg)  Height: 5\' 7"  (1.702 m)      Assessment & Plan:

## 2017-05-18 ENCOUNTER — Ambulatory Visit: Payer: Medicare Other

## 2017-05-18 NOTE — Progress Notes (Deleted)
Subjective:   Daisy Bryant is a 84 y.o. female who presents for an Initial Medicare Annual Wellness Visit.  Review of Systems    No ROS.  Medicare Wellness Visit. Additional risk factors are reflected in the social history.     Sleep patterns: {SX; SLEEP PATTERNS:18802::"feels rested on waking","does not get up to void","gets up *** times nightly to void","sleeps *** hours nightly"}.    Home Safety/Smoke Alarms: Feels safe in home. Smoke alarms in place.  Living environment; residence and Firearm Safety: {Rehab home environment / accessibility:30080::"no firearms","firearms stored safely"}. Seat Belt Safety/Bike Helmet: Wears seat belt.     Objective:    There were no vitals filed for this visit. There is no height or weight on file to calculate BMI.  Advanced Directives 11/08/2011  Does Patient Have a Medical Advance Directive? Patient has advance directive, copy not in chart  Type of Advance Directive [place];Living will  Copy of [place] in Chart? Copy requested from family  Pre-existing out of facility DNR order (yellow form or pink MOST form) No    Current Medications (verified) Outpatient Encounter Medications as of 05/18/2017  Medication Sig  . amitriptyline (ELAVIL) 25 MG tablet Take 2 tablets (50 mg total) by mouth at bedtime. Need office visit for further refills  . Cholecalciferol (VITAMIN D3) 1000 units CAPS Take by mouth.  . citalopram (CELEXA) 20 MG tablet Take 1 tablet (20 mg total) by mouth daily.  . [employer] VITAMIN B12 1000 MCG tablet TAKE 1 TABLET (1,000 MCG TOTAL) BY MOUTH DAILY.  . hydrochlorothiazide (HYDRODIURIL) 25 MG tablet TAKE 1 TABLET (25 MG TOTAL) BY MOUTH DAILY.  Marland Kitchen levothyroxine (SYNTHROID, LEVOTHROID) 50 MCG tablet Take 1 tablet (50 mcg total) by mouth daily.  . Probiotic Product (PROBIOTIC DAILY PO) Take by mouth daily.  . simvastatin (ZOCOR) 20 MG tablet TAKE 1 TABLET (20 MG TOTAL) BY MOUTH EVERY EVENING.    . simvastatin (ZOCOR) 20 MG tablet TAKE 1 TABLET (20 MG TOTAL) BY MOUTH EVERY EVENING.  . vitamin B-12 (CYANOCOBALAMIN) 1000 MCG tablet Take 1,000 mcg by mouth daily.   No facility-administered encounter medications on file as of 05/18/2017.     Allergies (verified) Esomeprazole magnesium and Iodine   History: Past Medical History:  Diagnosis Date  . Acute gastric ulcer without mention of hemorrhage, perforation, or obstruction 2002  . Anemia   . Anxiety   . Arthritis   . Cancer ([place])   . Colon polyp 2010   HYPERPLASTIC POLYP  . Depression   . Diverticulosis of colon (without mention of hemorrhage)   . GERD (gastroesophageal reflux disease)   . H/O hiatal hernia 2002  . Hypertension   . Hypothyroidism   . Neuromuscular disorder ([place])   . Stricture and stenosis of esophagus 2002  . Tuberculosis    Past Surgical History:  Procedure Laterality Date  . BREAST SURGERY    . ENTEROSCOPY  11/09/2011   Procedure: ENTEROSCOPY;  Surgeon: Inda Castle, MD;  Location: [facility];  Service: Endoscopy;  Laterality: N/A;  . EYE SURGERY    . JOINT REPLACEMENT Left    knee  . MASTECTOMY    . TONSILLECTOMY  under age 71   Family History  Problem Relation Age of Onset  . Pancreatic cancer Father    Social History   Socioeconomic History  . Marital status: Widowed    Spouse name: Not on file  . Number of children: 4  . Years  of education: 64  . Highest education level: Not on file  Occupational History  . Occupation: counseling -retired  Scientific laboratory technician  . Financial resource strain: Not on file  . Food insecurity:    Worry: Not on file    Inability: Not on file  . Transportation needs:    Medical: Not on file    Non-medical: Not on file  Tobacco Use  . Smoking status: Never Smoker  . Smokeless tobacco: Never Used  Substance and Sexual Activity  . Alcohol use: Yes    Alcohol/week: 0.5 oz    Types: 1 drink(s) per week    Comment: Corky Blumstein  . Drug use: No  . Sexual  activity: Never  Lifestyle  . Physical activity:    Days per week: Not on file    Minutes per session: Not on file  . Stress: Not on file  Relationships  . Social connections:    Talks on phone: Not on file    Gets together: Not on file    Attends religious service: Not on file    Active member of club or organization: Not on file    Attends meetings of clubs or organizations: Not on file    Relationship status: Not on file  Other Topics Concern  . 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 Married '55 - 2 years/divorced, married '65 - 2007/widowed. 2 sons - '55, '69; 2 dtr - '56, '68; 3 grandchildren. LIves alone and is independent all ADLs.  ACP- no CPR,  OK for short term mechanical ventilation, no heroic or futile measure in the face of irreversible disease.     Tobacco Counseling Counseling given: Not Answered  Activities of Daily Living No flowsheet data found.   Immunizations and Health Maintenance Immunization History  Administered Date(s) Administered  . Influenza Whole 09/19/2007, 10/04/2008, 09/24/2009, 11/23/2011  . Influenza, High Dose Seasonal PF 10/24/2016  . Influenza,inj,Quad PF,6+ Mos 09/03/2013, 10/09/2014, 08/24/2015  . Influenza-Unspecified 10/03/2012  . Pneumococcal Conjugate-13 10/09/2014  . Pneumococcal Polysaccharide-23 02/13/2004, 09/24/2009  . Tdap 04/01/2015   Health Maintenance Due  Topic Date Due  . DEXA SCAN  08/24/1997    Patient Care Team: Hoyt Koch, MD as PCP - General (Internal Medicine)  Indicate any recent Medical Services you may have received from other than [facility] providers in the past year (date may be approximate).     Assessment:   This is a routine wellness examination for Daisy Bryant. Physical assessment deferred to PCP.   Hearing/Vision screen No exam data present  Dietary issues and exercise activities discussed:   Diet (meal  preparation, eat out, water intake, caffeinated beverages, dairy products, fruits and vegetables): {Desc; diets:16563}    Goals    None     Depression Screen PHQ 2/9 Scores 03/21/2016 08/24/2015 04/24/2014  PHQ - 2 Score 0 0 0    Fall Risk Fall Risk  03/21/2016 08/24/2015 04/24/2014  Falls in the past year? No Yes No  Number falls in past yr: - 1 -  Injury with Fall? - No -   Cognitive Function:        Screening Tests Health Maintenance  Topic Date Due  . DEXA SCAN  08/24/1997  . INFLUENZA VACCINE  08/03/2017  . TETANUS/TDAP  03/31/2025  . PNA vac Low Risk Adult  Completed     Plan:     I have personally reviewed and  noted the following in the patient's chart:   . Medical and social history . Use of alcohol, tobacco or illicit drugs  . Current medications and supplements . Functional ability and status . Nutritional status . Physical activity . Advanced directives . List of other physicians . Vitals . Screenings to include cognitive, depression, and falls . Referrals and appointments  In addition, I have reviewed and discussed with patient certain preventive protocols, quality metrics, and best practice recommendations. A written personalized care plan for preventive services as well as general preventive health recommendations were provided to patient.     Michiel Cowboy, RN   05/18/2017

## 2017-05-23 ENCOUNTER — Other Ambulatory Visit: Payer: Self-pay | Admitting: Internal Medicine

## 2017-05-28 ENCOUNTER — Other Ambulatory Visit: Payer: Self-pay | Admitting: Internal Medicine

## 2017-06-12 ENCOUNTER — Ambulatory Visit (INDEPENDENT_AMBULATORY_CARE_PROVIDER_SITE_OTHER): Payer: Medicare Other | Admitting: *Deleted

## 2017-06-12 VITALS — BP 117/72 | HR 83 | Resp 18 | Ht 67.0 in | Wt 224.0 lb

## 2017-06-12 DIAGNOSIS — Z0000 Encounter for general adult medical examination without abnormal findings: Secondary | ICD-10-CM

## 2017-06-12 NOTE — Patient Instructions (Addendum)
Continue doing brain stimulating activities (puzzles, reading, adult coloring books, staying active) to keep memory sharp.   Continue to eat heart healthy diet (full of fruits, vegetables, whole grains, lean protein, water--limit salt, fat, and sugar intake) and increase physical activity as tolerated.=   Daisy Bryant , Thank you for taking time to come for your Medicare Wellness Visit. I appreciate your ongoing commitment to your health goals. Please review the following plan we discussed and let me know if I can assist you in the future.   These are the goals we discussed: Goals    . Patient Stated     Maintain my current health status, stay as healthy and as independent as possible. Enjoy life and family.       This is a list of the screening recommended for you and due dates:  Health Maintenance  Topic Date Due  . DEXA scan (bone density measurement)  06/13/2018*  . Flu Shot  08/03/2017  . Tetanus Vaccine  03/31/2025  . Pneumonia vaccines  Completed  *Topic was postponed. The date shown is not the original due date.   Health Maintenance, Female Adopting a healthy lifestyle and getting preventive care can go a long way to promote health and wellness. Talk with your health care provider about what schedule of regular examinations is right for you. This is a good chance for you to check in with your provider about disease prevention and staying healthy. In between checkups, there are plenty of things you can do on your own. Experts have done a lot of research about which lifestyle changes and preventive measures are most likely to keep you healthy. Ask your health care provider for more information. Weight and diet Eat a healthy diet  Be sure to include plenty of vegetables, fruits, low-fat dairy products, and lean protein.  Do not eat a lot of foods high in solid fats, added sugars, or salt.  Get regular exercise. This is one of the most important things you can do for your  health. ? Most adults should exercise for at least 150 minutes each week. The exercise should increase your heart rate and make you sweat (moderate-intensity exercise). ? Most adults should also do strengthening exercises at least twice a week. This is in addition to the moderate-intensity exercise.  Maintain a healthy weight  Body mass index (BMI) is a measurement that can be used to identify possible weight problems. It estimates body fat based on height and weight. Your health care provider can help determine your BMI and help you achieve or maintain a healthy weight.  For females 25 years of age and older: ? A BMI below 18.5 is considered underweight. ? A BMI of 18.5 to 24.9 is normal. ? A BMI of 25 to 29.9 is considered overweight. ? A BMI of 30 and above is considered obese.  Watch levels of cholesterol and blood lipids  You should start having your blood tested for lipids and cholesterol at 82 years of age, then have this test every 5 years.  You may need to have your cholesterol levels checked more often if: ? Your lipid or cholesterol levels are high. ? You are older than 82 years of age. ? You are at high risk for heart disease.  Cancer screening Lung Cancer  Lung cancer screening is recommended for adults 44-25 years old who are at high risk for lung cancer because of a history of smoking.  A yearly low-dose CT scan of the lungs  is recommended for people who: ? Currently smoke. ? Have quit within the past 15 years. ? Have at least a 30-pack-year history of smoking. A pack year is smoking an average of one pack of cigarettes a day for 1 year.  Yearly screening should continue until it has been 15 years since you quit.  Yearly screening should stop if you develop a health problem that would prevent you from having lung cancer treatment.  Breast Cancer  Practice breast self-awareness. This means understanding how your breasts normally appear and feel.  It also means  doing regular breast self-exams. Let your health care provider know about any changes, no matter how small.  If you are in your 20s or 30s, you should have a clinical breast exam (CBE) by a health care provider every 1-3 years as part of a regular health exam.  If you are 40 or older, have a CBE every year. Also consider having a breast X-ray (mammogram) every year.  If you have a family history of breast cancer, talk to your health care provider about genetic screening.  If you are at high risk for breast cancer, talk to your health care provider about having an MRI and a mammogram every year.  Breast cancer gene (BRCA) assessment is recommended for women who have family members with BRCA-related cancers. BRCA-related cancers include: ? Breast. ? Ovarian. ? Tubal. ? Peritoneal cancers.  Results of the assessment will determine the need for genetic counseling and BRCA1 and BRCA2 testing.  Cervical Cancer Your health care provider may recommend that you be screened regularly for cancer of the pelvic organs (ovaries, uterus, and vagina). This screening involves a pelvic examination, including checking for microscopic changes to the surface of your cervix (Pap test). You may be encouraged to have this screening done every 3 years, beginning at age 60.  For women ages 71-65, health care providers may recommend pelvic exams and Pap testing every 3 years, or they may recommend the Pap and pelvic exam, combined with testing for human papilloma virus (HPV), every 5 years. Some types of HPV increase your risk of cervical cancer. Testing for HPV may also be done on women of any age with unclear Pap test results.  Other health care providers may not recommend any screening for nonpregnant women who are considered low risk for pelvic cancer and who do not have symptoms. Ask your health care provider if a screening pelvic exam is right for you.  If you have had past treatment for cervical cancer or a  condition that could lead to cancer, you need Pap tests and screening for cancer for at least 20 years after your treatment. If Pap tests have been discontinued, your risk factors (such as having a new sexual partner) need to be reassessed to determine if screening should resume. Some women have medical problems that increase the chance of getting cervical cancer. In these cases, your health care provider may recommend more frequent screening and Pap tests.  Colorectal Cancer  This type of cancer can be detected and often prevented.  Routine colorectal cancer screening usually begins at 82 years of age and continues through 82 years of age.  Your health care provider may recommend screening at an earlier age if you have risk factors for colon cancer.  Your health care provider may also recommend using home test kits to check for hidden blood in the stool.  A small camera at the end of a tube can be used to examine  your colon directly (sigmoidoscopy or colonoscopy). This is done to check for the earliest forms of colorectal cancer.  Routine screening usually begins at age 68.  Direct examination of the colon should be repeated every 5-10 years through 82 years of age. However, you may need to be screened more often if early forms of precancerous polyps or small growths are found.  Skin Cancer  Check your skin from head to toe regularly.  Tell your health care provider about any new moles or changes in moles, especially if there is a change in a mole's shape or color.  Also tell your health care provider if you have a mole that is larger than the size of a pencil eraser.  Always use sunscreen. Apply sunscreen liberally and repeatedly throughout the day.  Protect yourself by wearing long sleeves, pants, a wide-brimmed hat, and sunglasses whenever you are outside.  Heart disease, diabetes, and high blood pressure  High blood pressure causes heart disease and increases the risk of stroke.  High blood pressure is more likely to develop in: ? People who have blood pressure in the high end of the normal range (130-139/85-89 mm Hg). ? People who are overweight or obese. ? People who are African American.  If you are 72-17 years of age, have your blood pressure checked every 3-5 years. If you are 58 years of age or older, have your blood pressure checked every year. You should have your blood pressure measured twice-once when you are at a hospital or clinic, and once when you are not at a hospital or clinic. Record the average of the two measurements. To check your blood pressure when you are not at a hospital or clinic, you can use: ? An automated blood pressure machine at a pharmacy. ? A home blood pressure monitor.  If you are between 30 years and 29 years old, ask your health care provider if you should take aspirin to prevent strokes.  Have regular diabetes screenings. This involves taking a blood sample to check your fasting blood sugar level. ? If you are at a normal weight and have a low risk for diabetes, have this test once every three years after 82 years of age. ? If you are overweight and have a high risk for diabetes, consider being tested at a younger age or more often. Preventing infection Hepatitis B  If you have a higher risk for hepatitis B, you should be screened for this virus. You are considered at high risk for hepatitis B if: ? You were born in a country where hepatitis B is common. Ask your health care provider which countries are considered high risk. ? Your parents were born in a high-risk country, and you have not been immunized against hepatitis B (hepatitis B vaccine). ? You have HIV or AIDS. ? You use needles to inject street drugs. ? You live with someone who has hepatitis B. ? You have had sex with someone who has hepatitis B. ? You get hemodialysis treatment. ? You take certain medicines for conditions, including cancer, organ transplantation, and  autoimmune conditions.  Hepatitis C  Blood testing is recommended for: ? Everyone born from [birth year] through 1963. ? Anyone with known risk factors for hepatitis C.  Sexually transmitted infections (STIs)  You should be screened for sexually transmitted infections (STIs) including gonorrhea and chlamydia if: ? You are sexually active and are younger than 82 years of age. ? You are older than 82 years of age and your  health care provider tells you that you are at risk for this type of infection. ? Your sexual activity has changed since you were last screened and you are at an increased risk for chlamydia or gonorrhea. Ask your health care provider if you are at risk.  If you do not have HIV, but are at risk, it may be recommended that you take a prescription medicine daily to prevent HIV infection. This is called pre-exposure prophylaxis (PrEP). You are considered at risk if: ? You are sexually active and do not regularly use condoms or know the HIV status of your partner(s). ? You take drugs by injection. ? You are sexually active with a partner who has HIV.  Talk with your health care provider about whether you are at high risk of being infected with HIV. If you choose to begin PrEP, you should first be tested for HIV. You should then be tested every 3 months for as long as you are taking PrEP. Pregnancy  If you are premenopausal and you may become pregnant, ask your health care provider about preconception counseling.  If you may become pregnant, take 400 to 800 micrograms (mcg) of folic acid every day.  If you want to prevent pregnancy, talk to your health care provider about birth control (contraception). Osteoporosis and menopause  Osteoporosis is a disease in which the bones lose minerals and strength with aging. This can result in serious bone fractures. Your risk for osteoporosis can be identified using a bone density scan.  If you are 84 years of age or older, or if you are at  risk for osteoporosis and fractures, ask your health care provider if you should be screened.  Ask your health care provider whether you should take a calcium or vitamin D supplement to lower your risk for osteoporosis.  Menopause may have certain physical symptoms and risks.  Hormone replacement therapy may reduce some of these symptoms and risks. Talk to your health care provider about whether hormone replacement therapy is right for you. Follow these instructions at home:  Schedule regular health, dental, and eye exams.  Stay current with your immunizations.  Do not use any tobacco products including cigarettes, chewing tobacco, or electronic cigarettes.  If you are pregnant, do not drink alcohol.  If you are breastfeeding, limit how much and how often you drink alcohol.  Limit alcohol intake to no more than 1 drink per day for nonpregnant women. One drink equals 12 ounces of beer, 5 ounces of Ralph Brouwer, or 1 ounces of hard liquor.  Do not use street drugs.  Do not share needles.  Ask your health care provider for help if you need support or information about quitting drugs.  Tell your health care provider if you often feel depressed.  Tell your health care provider if you have ever been abused or do not feel safe at home. This information is not intended to replace advice given to you by your health care provider. Make sure you discuss any questions you have with your health care provider. Document Released: 07/05/2010 Document Revised: 05/28/2015 Document Reviewed: 09/23/2014 Elsevier Interactive Patient Education  [employer]. It is important to avoid accidents which may result in broken bones.  Here are a few ideas on how to make your home safer so you will be less likely to trip or fall.  1. Use nonskid mats or non slip strips in your shower or tub, on your bathroom floor and around sinks.  If you know  that you have spilled water, wipe it up! 2. In the bathroom, it is  important to have properly installed grab bars on the walls or on the edge of the tub.  Towel racks are NOT strong enough for you to hold onto or to pull on for support. 3. Stairs and hallways should have enough light.  Add lamps or night lights if you need ore light. 4. It is good to have handrails on both sides of the stairs if possible.  Always fix broken handrails right away. 5. It is important to see the edges of steps.  Paint the edges of outdoor steps white so you can see them better.  Put colored tape on the edge of inside steps. 6. Throw-rugs are dangerous because they can slide.  Removing the rugs is the best idea, but if they must stay, add adhesive carpet tape to prevent slipping. 7. Do not keep things on stairs or in the halls.  Remove small furniture that blocks the halls as it may cause you to trip.  Keep telephone and electrical cords out of the way where you walk. 8. Always were sturdy, rubber-soled shoes for good support.  Never wear just socks, especially on the stairs.  Socks may cause you to slip or fall.  Do not wear full-length housecoats as you can easily trip on the bottom.  9. Place the things you use the most on the shelves that are the easiest to reach.  If you use a stepstool, make sure it is in good condition.  If you feel unsteady, DO NOT climb, ask for help. 10. If a health professional advises you to use a cane or walker, do not be ashamed.  These items can keep you from falling and breaking your bones.

## 2017-06-12 NOTE — Progress Notes (Addendum)
Subjective:   Daisy Bryant is a 84 y.o. female who presents for an Initial Medicare Annual Wellness Visit.  Review of Systems    No ROS.  Medicare Wellness Visit. Additional risk factors are reflected in the social history.   Cardiac Risk Factors include: advanced age (>46men, >49 women);dyslipidemia;hypertension Sleep patterns: gets up 0 times nightly to void and sleeps 7-8 hours nightly.    Home Safety/Smoke Alarms: Feels safe in home. Smoke alarms in place.  Living environment; residence and Firearm Safety: 1-story house/ trailer, no firearms, Lives alone, no needs for DME, good support system . Seat Belt Safety/Bike Helmet: Wears seat belt.     Objective:    Today's Vitals   06/12/17 1424 06/12/17 1431  BP: 117/72   Pulse: 83   Resp: 18   SpO2: 99%   Weight: 224 lb (101.6 kg)   Height: 5\' 7"  (1.702 m)   PainSc:  1    Body mass index is 35.08 kg/m.  Advanced Directives 06/12/2017 11/08/2011  Does Patient Have a Medical Advance Directive? Yes Patient has advance directive, copy not in chart  Type of Advance Directive Daisy Bryant;Living will Daisy Bryant;Living will  Copy of Daisy Bryant in Chart? No - copy requested Copy requested from family  Pre-existing out of facility DNR order (yellow form or pink MOST form) - No    Current Medications (verified) Outpatient Encounter Medications as of 06/12/2017  Medication Sig  . amitriptyline (ELAVIL) 25 MG tablet Take 2 tablets (50 mg total) by mouth at bedtime. Need office visit for further refills  . Cholecalciferol (VITAMIN D3) 1000 units CAPS Take by mouth.  . citalopram (CELEXA) 20 MG tablet Take 1 tablet (20 mg total) by mouth daily. Need annual appointment for further refills  . [facility] VITAMIN B12 1000 MCG tablet TAKE 1 TABLET (1,000 MCG TOTAL) BY MOUTH DAILY.  . hydrochlorothiazide (HYDRODIURIL) 25 MG tablet TAKE 1 TABLET BY MOUTH EVERY DAY.  Marland Kitchen levothyroxine (SYNTHROID,  LEVOTHROID) 50 MCG tablet Take 1 tablet (50 mcg total) by mouth daily. Need annual visit with labs for further refills  . Probiotic Product (PROBIOTIC DAILY PO) Take by mouth daily.  . simvastatin (ZOCOR) 20 MG tablet TAKE 1 TABLET (20 MG TOTAL) BY MOUTH EVERY EVENING.  . simvastatin (ZOCOR) 20 MG tablet TAKE 1 TABLET (20 MG TOTAL) BY MOUTH EVERY EVENING.  . vitamin B-12 (CYANOCOBALAMIN) 1000 MCG tablet Take 1,000 mcg by mouth daily.   No facility-administered encounter medications on file as of 06/12/2017.     Allergies (verified) Esomeprazole magnesium and Iodine   History: Past Medical History:  Diagnosis Date  . Acute gastric ulcer without mention of hemorrhage, perforation, or obstruction 2002  . Anemia   . Anxiety   . Arthritis   . Cancer ([place])   . Colon polyp 2010   HYPERPLASTIC POLYP  . Depression   . Diverticulosis of colon (without mention of hemorrhage)   . GERD (gastroesophageal reflux disease)   . H/O hiatal hernia 2002  . Hypertension   . Hypothyroidism   . Neuromuscular disorder ([place])   . Stricture and stenosis of esophagus 2002  . Tuberculosis    Past Surgical History:  Procedure Laterality Date  . BREAST SURGERY    . ENTEROSCOPY  11/09/2011   Procedure: ENTEROSCOPY;  Surgeon: Inda Castle, MD;  Location: [facility];  Service: Endoscopy;  Laterality: N/A;  . EYE SURGERY    . JOINT REPLACEMENT Left  knee  . MASTECTOMY    . TONSILLECTOMY  under age 39   Family History  Problem Relation Age of Onset  . Pancreatic cancer Father    Social History   Socioeconomic History  . Marital status: Widowed    Spouse name: Not on file  . Number of children: 4  . Years of education: 23  . Highest education level: Not on file  Occupational History  . Occupation: counseling -retired  Scientific laboratory technician  . Financial resource strain: Not hard at all  . Food insecurity:    Worry: Never true    Inability: Never true  . Transportation needs:    Medical: No     Non-medical: No  Tobacco Use  . Smoking status: Never Smoker  . Smokeless tobacco: Never Used  Substance and Sexual Activity  . Alcohol use: Yes    Alcohol/week: 0.5 oz    Types: 1 drink(s) per week    Comment: Daisy Bryant  . Drug use: No  . Sexual activity: Never  Lifestyle  . Physical activity:    Days per week: 0 days    Minutes per session: 0 min  . Stress: Not at all  Relationships  . Social connections:    Talks on phone: More than three times a week    Gets together: More than three times a week    Attends religious service: More than 4 times per year    Active member of club or organization: Yes    Attends meetings of clubs or organizations: More than 4 times per year    Relationship status: Widowed  Other Topics Concern  . 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 Married '55 - 2 years/divorced, married '65 - 2007/widowed. 2 sons - '55, '69; 2 dtr - '56, '68; 3 grandchildren. LIves alone and is independent all ADLs.  ACP- no CPR,  OK for short term mechanical ventilation, no heroic or futile measure in the face of irreversible disease.     Tobacco Counseling Counseling given: Not Answered  Activities of Daily Living In your present state of health, do you have any difficulty performing the following activities: 06/12/2017  Hearing? N  Vision? N  Difficulty concentrating or making decisions? N  Walking or climbing stairs? N  Dressing or bathing? N  Doing errands, shopping? N  Preparing Food and eating ? N  Using the Toilet? N  In the past six months, have you accidently leaked urine? N  Do you have problems with loss of bowel control? N  Managing your Medications? N  Managing your Finances? N  Housekeeping or managing your Housekeeping? N  Some recent data might be hidden     Immunizations and Health Maintenance Immunization History  Administered Date(s) Administered  . Influenza  Whole 09/19/2007, 10/04/2008, 09/24/2009, 11/23/2011  . Influenza, High Dose Seasonal PF 10/24/2016  . Influenza,inj,Quad PF,6+ Mos 09/03/2013, 10/09/2014, 08/24/2015  . Influenza-Unspecified 10/03/2012  . Pneumococcal Conjugate-13 10/09/2014  . Pneumococcal Polysaccharide-23 02/13/2004, 09/24/2009  . Tdap 04/01/2015   There are no preventive care reminders to display for this patient.  Patient Care Team: Hoyt Koch, MD as PCP - General (Internal Medicine)  Indicate any recent Medical Services you may have received from other than [facility] providers in the past year (date may be approximate).     Assessment:   This is a routine wellness examination for Seth. Physical assessment deferred  to PCP.   Hearing/Vision screen Hearing Screening Comments: Able to hear conversational tones w/o difficulty. No issues reported.  Passed whisper test Vision Screening Comments:   appointment yearly   Dietary issues and exercise activities discussed: Current Exercise Habits: The patient does not participate in regular exercise at present Diet (meal preparation, eat out, water intake, caffeinated beverages, dairy products, fruits and vegetables): in general, a "healthy" diet  , well balanced , eats a variety of fruits and vegetables daily, limits salt, fat/cholesterol, sugar,carbohydrates,caffeine, drinks 6-8 glasses of water daily.  Reviewed heart healthy and diabetic diet.  Encouraged patient to increase daily water and fluid intake.    Goals    . Patient Stated     Maintain my current health status, stay as healthy and as independent as possible. Enjoy life and family.      Depression Screen PHQ 2/9 Scores 06/12/2017 03/21/2016 08/24/2015 04/24/2014  PHQ - 2 Score 0 0 0 0  PHQ- 9 Score 0 - - -    Fall Risk Fall Risk  06/12/2017 03/21/2016 08/24/2015 04/24/2014  Falls in the past year? No No Yes No  Number falls in past yr: - - 1 -  Injury with Fall? - - No -   Cognitive  Function: MMSE - Mini Mental State Exam 06/12/2017  Orientation to time 5  Orientation to Place 5  Registration 3  Attention/ Calculation 5  Recall 2  Language- name 2 objects 2  Language- repeat 1  Language- follow 3 step command 3  Language- read & follow direction 1  Write a sentence 1  Copy design 1  Total score 29        Screening Tests Health Maintenance  Topic Date Due  . DEXA SCAN  06/13/2018 (Originally 08/24/1997)  . INFLUENZA VACCINE  08/03/2017  . TETANUS/TDAP  03/31/2025  . PNA vac Low Risk Adult  Completed     Plan:   Continue doing brain stimulating activities (puzzles, reading, adult coloring books, staying active) to keep memory sharp.   Continue to eat heart healthy diet (full of fruits, vegetables, whole grains, lean protein, water--limit salt, fat, and sugar intake) and increase physical activity as tolerated.   I have personally reviewed and noted the following in the patient's chart:   . Medical and social history . Use of alcohol, tobacco or illicit drugs  . Current medications and supplements . Functional ability and status . Nutritional status . Physical activity . Advanced directives . List of other physicians . Vitals . Screenings to include cognitive, depression, and falls . Referrals and appointments  In addition, I have reviewed and discussed with patient certain preventive protocols, quality metrics, and best practice recommendations. A written personalized care plan for preventive services as well as general preventive health recommendations were provided to patient.     Michiel Cowboy, RN   06/12/2017    Medical screening examination/treatment/procedure(s) were performed by non-physician practitioner and as supervising physician I was immediately available for consultation/collaboration. I agree with above. Binnie Rail, MD

## 2017-06-14 ENCOUNTER — Other Ambulatory Visit: Payer: Self-pay | Admitting: Internal Medicine

## 2017-06-30 ENCOUNTER — Other Ambulatory Visit: Payer: Self-pay | Admitting: Internal Medicine

## 2017-07-12 ENCOUNTER — Other Ambulatory Visit: Payer: Self-pay | Admitting: Internal Medicine

## 2017-07-13 ENCOUNTER — Other Ambulatory Visit: Payer: Self-pay | Admitting: Internal Medicine

## 2017-08-21 ENCOUNTER — Other Ambulatory Visit: Payer: Self-pay | Admitting: Internal Medicine

## 2017-08-22 ENCOUNTER — Other Ambulatory Visit: Payer: Self-pay | Admitting: Internal Medicine

## 2017-09-05 ENCOUNTER — Encounter: Payer: Self-pay | Admitting: Internal Medicine

## 2017-09-05 ENCOUNTER — Ambulatory Visit: Payer: Medicare Other | Admitting: Internal Medicine

## 2017-09-05 VITALS — BP 110/72 | HR 93 | Temp 98.2°F | Ht 67.0 in | Wt 225.0 lb

## 2017-09-05 DIAGNOSIS — Z23 Encounter for immunization: Secondary | ICD-10-CM

## 2017-09-05 DIAGNOSIS — R29898 Other symptoms and signs involving the musculoskeletal system: Secondary | ICD-10-CM

## 2017-09-05 DIAGNOSIS — IMO0002 Reserved for concepts with insufficient information to code with codable children: Secondary | ICD-10-CM

## 2017-09-05 NOTE — Patient Instructions (Signed)
If you get a cold foot, a wound on the foot or pain let us know.   Try walking more to help promote circulation.

## 2017-09-05 NOTE — Progress Notes (Signed)
   Subjective:    Patient ID: Daisy Bryant, female    DOB: 1930-01-06, 84 y.o.   MRN: [id number]  HPI The patient is an 84 YO female coming in for change in color of her left foot. There is a purple color to it. Denies coldness or pain or itching or injury. The other foot is some purple but not as noticeable. She is not a smoker. Denies pain with walking in either leg. Denies injury or overuse recently. She admits to not being as active recently.   Review of Systems  Constitutional: Negative.   Respiratory: Negative for cough, chest tightness and shortness of breath.   Cardiovascular: Negative for chest pain, palpitations and leg swelling.  Gastrointestinal: Negative for abdominal distention, abdominal pain, constipation, diarrhea, nausea and vomiting.  Musculoskeletal: Negative.   Skin: Positive for color change.  Neurological: Negative.       Objective:   Physical Exam  Constitutional: She is oriented to person, place, and time. She appears well-developed and well-nourished.  HENT:  Head: Normocephalic and atraumatic.  Eyes: EOM are normal.  Neck: Normal range of motion.  Cardiovascular: Normal rate and regular rhythm.  Pulmonary/Chest: Effort normal and breath sounds normal. No respiratory distress. She has no wheezes. She has no rales.  Musculoskeletal: She exhibits no edema.  Neurological: She is alert and oriented to person, place, and time. Coordination normal.  Skin: Skin is warm and dry.  Both feet are purplish in hue, left more than right, both are warm to touch and with pulses present. No edema pitting, some soft tissue swelling in the ankles which is chronic and unchanged.    Vitals:   09/05/17 1458  BP: 110/72  Pulse: 93  Temp: 98.2 F (36.8 C)  TempSrc: Oral  SpO2: 96%  Weight: 225 lb (102.1 kg)  Height: 5\' 7"  (1.702 m)      Assessment & Plan:  Flu shot given at visit

## 2017-09-06 ENCOUNTER — Encounter: Payer: Self-pay | Admitting: Internal Medicine

## 2017-09-06 DIAGNOSIS — R29898 Other symptoms and signs involving the musculoskeletal system: Principal | ICD-10-CM

## 2017-09-06 DIAGNOSIS — IMO0002 Reserved for concepts with insufficient information to code with codable children: Secondary | ICD-10-CM | POA: Insufficient documentation

## 2017-09-06 NOTE — Assessment & Plan Note (Signed)
Suspect this is normal with vein changing. Offered US arterial to evaluate blood flow but she declines today. She is a non-smoker. Advised to be more active, call back for any worsening color change, cold foot, painful foot.

## 2017-09-23 ENCOUNTER — Other Ambulatory Visit: Payer: Self-pay | Admitting: Internal Medicine

## 2017-10-05 ENCOUNTER — Other Ambulatory Visit: Payer: Self-pay | Admitting: Internal Medicine

## 2017-10-20 ENCOUNTER — Other Ambulatory Visit: Payer: Self-pay | Admitting: Internal Medicine

## 2017-10-28 ENCOUNTER — Other Ambulatory Visit: Payer: Self-pay | Admitting: Internal Medicine

## 2017-11-20 ENCOUNTER — Other Ambulatory Visit: Payer: Self-pay | Admitting: Internal Medicine

## 2017-11-27 ENCOUNTER — Other Ambulatory Visit: Payer: Self-pay | Admitting: Internal Medicine

## 2017-12-09 ENCOUNTER — Other Ambulatory Visit: Payer: Self-pay | Admitting: Internal Medicine

## 2017-12-13 ENCOUNTER — Other Ambulatory Visit: Payer: Self-pay | Admitting: Internal Medicine

## 2017-12-21 ENCOUNTER — Other Ambulatory Visit: Payer: Self-pay | Admitting: Internal Medicine

## 2017-12-23 ENCOUNTER — Other Ambulatory Visit: Payer: Self-pay | Admitting: Internal Medicine

## 2018-01-01 ENCOUNTER — Other Ambulatory Visit: Payer: Self-pay | Admitting: Internal Medicine

## 2018-01-04 ENCOUNTER — Ambulatory Visit: Payer: Medicare Other | Admitting: Internal Medicine

## 2018-01-04 ENCOUNTER — Encounter: Payer: Self-pay | Admitting: Internal Medicine

## 2018-01-04 ENCOUNTER — Other Ambulatory Visit (INDEPENDENT_AMBULATORY_CARE_PROVIDER_SITE_OTHER): Payer: Medicare Other

## 2018-01-04 VITALS — BP 142/78 | HR 79 | Temp 98.3°F | Ht 67.0 in | Wt 230.0 lb

## 2018-01-04 DIAGNOSIS — E782 Mixed hyperlipidemia: Secondary | ICD-10-CM | POA: Diagnosis not present

## 2018-01-04 DIAGNOSIS — F325 Major depressive disorder, single episode, in full remission: Secondary | ICD-10-CM | POA: Diagnosis not present

## 2018-01-04 DIAGNOSIS — Z0000 Encounter for general adult medical examination without abnormal findings: Secondary | ICD-10-CM

## 2018-01-04 DIAGNOSIS — E039 Hypothyroidism, unspecified: Secondary | ICD-10-CM

## 2018-01-04 DIAGNOSIS — I10 Essential (primary) hypertension: Secondary | ICD-10-CM

## 2018-01-04 LAB — CBC
HEMATOCRIT: 40.5 % (ref 36.0–46.0)
Hemoglobin: 13.5 g/dL (ref 12.0–15.0)
MCHC: 33.3 g/dL (ref 30.0–36.0)
MCV: 95.3 fl (ref 78.0–100.0)
Platelets: 247 10*3/uL (ref 150.0–400.0)
RBC: 4.25 Mil/uL (ref 3.87–5.11)
RDW: 13.9 % (ref 11.5–15.5)
WBC: 6 10*3/uL (ref 4.0–10.5)

## 2018-01-04 LAB — LIPID PANEL
CHOLESTEROL: 150 mg/dL (ref 0–200)
HDL: 57.7 mg/dL (ref >39.00)
LDL Cholesterol: 58 mg/dL (ref 0–99)
NonHDL: 92.09
Total CHOL/HDL Ratio: 3
Triglycerides: 172 mg/dL — ABNORMAL HIGH (ref 0.0–149.0)
VLDL: 34.4 mg/dL (ref 0.0–40.0)

## 2018-01-04 LAB — COMPREHENSIVE METABOLIC PANEL
ALT: 17 U/L (ref 0–35)
AST: 19 U/L (ref 0–37)
Albumin: 4.2 g/dL (ref 3.5–5.2)
Alkaline Phosphatase: 40 U/L (ref 39–117)
BUN: 20 mg/dL (ref 6–23)
CO2: 29 mEq/L (ref 19–32)
Calcium: 9.6 mg/dL (ref 8.4–10.5)
Chloride: 104 mEq/L (ref 96–112)
Creatinine, Ser: 1.03 mg/dL (ref 0.40–1.20)
GFR: 54.08 mL/min — ABNORMAL LOW (ref >60.00)
Glucose, Bld: 93 mg/dL (ref 70–99)
Potassium: 3.9 mEq/L (ref 3.5–5.1)
Sodium: 140 mEq/L (ref 135–145)
Total Bilirubin: 1 mg/dL (ref 0.2–1.2)
Total Protein: 7.2 g/dL (ref 6.0–8.3)

## 2018-01-04 LAB — T4, FREE: Free T4: 0.84 ng/dL (ref 0.60–1.60)

## 2018-01-04 LAB — TSH: TSH: 7.35 u[IU]/mL — ABNORMAL HIGH (ref 0.35–4.50)

## 2018-01-04 MED ORDER — HYDROCHLOROTHIAZIDE 25 MG PO TABS: ORAL_TABLET | ORAL | 3 refills | Status: DC

## 2018-01-04 MED ORDER — SIMVASTATIN 20 MG PO TABS: 20.0000 mg | ORAL_TABLET | Freq: Every evening | ORAL | 3 refills | Status: DC

## 2018-01-04 MED ORDER — LEVOTHYROXINE SODIUM 50 MCG PO TABS: 50.0000 ug | ORAL_TABLET | Freq: Every day | ORAL | 3 refills | Status: DC

## 2018-01-04 MED ORDER — CITALOPRAM HYDROBROMIDE 20 MG PO TABS: 20.0000 mg | ORAL_TABLET | Freq: Every day | ORAL | 3 refills | Status: DC

## 2018-01-04 NOTE — Patient Instructions (Signed)

## 2018-01-04 NOTE — Progress Notes (Signed)
   Subjective:   Patient ID: Daisy Bryant, female    DOB: [birth year], 85 y.o.   MRN: [id number]  HPI The patient is an 85 YO female coming in for physical.   PMH, [facility], social history reviewed and updated.   Review of Systems  Constitutional: Negative.   HENT: Negative.   Eyes: Negative.   Respiratory: Negative for cough, chest tightness and shortness of breath.   Cardiovascular: Negative for chest pain, palpitations and leg swelling.  Gastrointestinal: Negative for abdominal distention, abdominal pain, constipation, diarrhea, nausea and vomiting.  Musculoskeletal: Positive for arthralgias.  Skin: Negative.   Neurological: Negative.   Psychiatric/Behavioral: Negative.     Objective:  Physical Exam Constitutional:      Appearance: She is well-developed.  HENT:     Head: Normocephalic and atraumatic.  Neck:     Musculoskeletal: Normal range of motion.  Cardiovascular:     Rate and Rhythm: Normal rate and regular rhythm.  Pulmonary:     Effort: Pulmonary effort is normal. No respiratory distress.     Breath sounds: Normal breath sounds. No wheezing or rales.  Abdominal:     General: Bowel sounds are normal. There is no distension.     Palpations: Abdomen is soft.     Tenderness: There is no abdominal tenderness. There is no rebound.  Skin:    General: Skin is warm and dry.  Neurological:     Mental Status: She is alert and oriented to person, place, and time.     Coordination: Coordination normal.     Vitals:   01/04/18 1336 01/04/18 1435  BP: (!) 160/100 (!) 142/78  Pulse: 79   Temp: 98.3 F (36.8 C)   TempSrc: Oral   SpO2: 97%   Weight: 230 lb (104.3 kg)   Height: 5\' 7"  (1.702 m)     Assessment & Plan:

## 2018-01-05 DIAGNOSIS — Z0000 Encounter for general adult medical examination without abnormal findings: Secondary | ICD-10-CM | POA: Insufficient documentation

## 2018-01-05 NOTE — Assessment & Plan Note (Signed)
Checking lipid panel and adjust simvastatin as needed.  

## 2018-01-05 NOTE — Assessment & Plan Note (Signed)
Flu shot up to date. Pneumonia complete. Shingrix counseled. Tetanus up to date. Colonoscopy aged out. Mammogram aged out, pap smear aged out and dexa up to date. Counseled about sun safety and mole surveillance. Counseled about the dangers of distracted driving. Given 10 year screening recommendations.  

## 2018-01-05 NOTE — Assessment & Plan Note (Signed)
Taking celexa and symptoms are controlled. She wishes to continue given risk of recurrence with stopping.

## 2018-01-05 NOTE — Assessment & Plan Note (Signed)
BP at goal on hctz and losartan. Checking CMP and adjust as needed.

## 2018-01-05 NOTE — Assessment & Plan Note (Signed)
Checking TSH and adjust synthroid 50 mcg daily as needed. 

## 2018-01-07 ENCOUNTER — Other Ambulatory Visit: Payer: Self-pay | Admitting: Internal Medicine

## 2018-01-15 ENCOUNTER — Other Ambulatory Visit: Payer: Self-pay | Admitting: Internal Medicine

## 2018-04-23 ENCOUNTER — Other Ambulatory Visit: Payer: Self-pay | Admitting: Internal Medicine

## 2018-05-11 ENCOUNTER — Encounter: Payer: Self-pay | Admitting: Gastroenterology

## 2018-05-19 ENCOUNTER — Other Ambulatory Visit: Payer: Self-pay | Admitting: Internal Medicine

## 2018-06-13 NOTE — Progress Notes (Signed)
Subjective:   Daisy Bryant is a 85 y.o. female who presents for Medicare Annual (Subsequent) preventive examination. I connected with patient by a telephone and verified that I am speaking with the correct person using two identifiers. Patient stated full name and DOB. Patient gave permission to continue with telephonic visit. Patient's location was at home and Nurse's location was at [place] office.   Review of Systems:  No ROS.  Medicare Wellness Virtual Visit.  Visual/audio telehealth visit, UTA vital signs.   See social history for additional risk factors. Cardiac Risk Factors include: advanced age (>6men, >61 women);dyslipidemia;hypertension Sleep patterns: feels rested on waking, gets up 0-1 times nightly to void and sleeps 8-9 hours nightly.    Home Safety/Smoke Alarms: Feels safe in home. Smoke alarms in place.  Living environment; residence and Firearm Safety: 1-story house/ trailer. Lives alone, no needs for DME, good support system Seat Belt Safety/Bike Helmet: Wears seat belt.     Objective:     Vitals: There were no vitals taken for this visit.  There is no height or weight on file to calculate BMI.  Advanced Directives 06/14/2018 06/12/2017 11/08/2011  Does Patient Have a Medical Advance Directive? Yes Yes Patient has advance directive, copy not in chart  Type of Advance Directive [place];Living will [place];Living will [place];Living will  Copy of [place] in Chart? No - copy requested No - copy requested Copy requested from family  Pre-existing out of facility DNR order (yellow form or pink MOST form) - - No    Tobacco Social History   Tobacco Use  Smoking Status Never Smoker  Smokeless Tobacco Never Used     Counseling given: Not Answered  Past Medical History:  Diagnosis Date  . Acute gastric ulcer without mention of hemorrhage, perforation, or obstruction 2002  . Anemia    . Anxiety   . Arthritis   . Cancer ([place])   . Colon polyp 2010   HYPERPLASTIC POLYP  . Depression   . Diverticulosis of colon (without mention of hemorrhage)   . GERD (gastroesophageal reflux disease)   . H/O hiatal hernia 2002  . Hypertension   . Hypothyroidism   . Neuromuscular disorder ([place])   . Stricture and stenosis of esophagus 2002  . Tuberculosis    Past Surgical History:  Procedure Laterality Date  . BREAST SURGERY    . ENTEROSCOPY  11/09/2011   Procedure: ENTEROSCOPY;  Surgeon: Inda Castle, MD;  Location: [facility];  Service: Endoscopy;  Laterality: N/A;  . EYE SURGERY    . JOINT REPLACEMENT Left    knee  . MASTECTOMY    . TONSILLECTOMY  under age 25   Family History  Problem Relation Age of Onset  . Pancreatic cancer Father    Social History   Socioeconomic History  . Marital status: Widowed    Spouse name: Not on file  . Number of children: 4  . Years of education: 35  . Highest education level: Not on file  Occupational History  . Occupation: counseling -retired  Scientific laboratory technician  . Financial resource strain: Not hard at all  . Food insecurity    Worry: Never true    Inability: Never true  . Transportation needs    Medical: No    Non-medical: No  Tobacco Use  . Smoking status: Never Smoker  . Smokeless tobacco: Never Used  Substance and Sexual Activity  . Alcohol use: Yes  Alcohol/week: 1.0 standard drinks    Types: 1 drink(s) per week    Comment: wine  . Drug use: No  . Sexual activity: Never  Lifestyle  . Physical activity    Days per week: 0 days    Minutes per session: 0 min  . Stress: Not at all  Relationships  . Social connections    Talks on phone: More than three times a week    Gets together: More than three times a week    Attends religious service: More than 4 times per year    Active member of club or organization: Yes    Attends meetings of clubs or organizations: More than 4 times per year    Relationship status:  Widowed  Other Topics Concern  . 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 Married '55 - 2 years/divorced, married '65 - 2007/widowed. 2 sons - '55, '69; 2 dtr - '56, '68; 3 grandchildren. LIves alone and is independent all ADLs.  ACP- no CPR,  OK for short term mechanical ventilation, no heroic or futile measure in the face of irreversible disease.     Outpatient Encounter Medications as of 06/14/2018  Medication Sig  . amitriptyline (ELAVIL) 25 MG tablet TAKE 2 TABLETS (50 MG TOTAL) BY MOUTH AT BEDTIME.  Marland Kitchen Cholecalciferol (VITAMIN D3) 1000 units CAPS Take by mouth.  . citalopram (CELEXA) 20 MG tablet Take 1 tablet (20 mg total) by mouth daily.  . [facility] VITAMIN B12 1000 MCG tablet TAKE 1 TABLET BY MOUTH EVERY DAY  . hydrochlorothiazide (HYDRODIURIL) 25 MG tablet TAKE 1 TABLET BY MOUTH EVERY DAY.  Marland Kitchen levothyroxine (SYNTHROID, LEVOTHROID) 50 MCG tablet Take 1 tablet (50 mcg total) by mouth daily.  . Probiotic Product (PROBIOTIC DAILY PO) Take by mouth daily.  . simvastatin (ZOCOR) 20 MG tablet Take 1 tablet (20 mg total) by mouth every evening.  . [DISCONTINUED] vitamin B-12 (CYANOCOBALAMIN) 1000 MCG tablet Take 1,000 mcg by mouth daily.   No facility-administered encounter medications on file as of 06/14/2018.     Activities of Daily Living In your present state of health, do you have any difficulty performing the following activities: 06/14/2018  Hearing? N  Vision? N  Difficulty concentrating or making decisions? N  Walking or climbing stairs? N  Dressing or bathing? N  Doing errands, shopping? N  Preparing Food and eating ? N  Using the Toilet? N  In the past six months, have you accidently leaked urine? N  Do you have problems with loss of bowel control? N  Managing your Medications? N  Managing your Finances? N  Housekeeping or managing your Housekeeping? N  Some recent data might be hidden     Patient Care Team: Hoyt Koch, MD as PCP - General (Internal Medicine)    Assessment:   This is a routine wellness examination for Daisy Bryant. Physical assessment deferred to PCP.  Exercise Activities and Dietary recommendations Current Exercise Habits: Home exercise routine, Type of exercise: walking, Time (Minutes): 35, Frequency (Times/Week): 4, Weekly Exercise (Minutes/Week): 140, Intensity: Mild, Exercise limited by: orthopedic condition(s)  Diet (meal preparation, eat out, water intake, caffeinated beverages, dairy products, fruits and vegetables): in general, a "healthy" diet  , well balanced   Reviewed heart healthy. Encouraged patient to increase daily water and healthy fluid intake.  Goals    . Patient Stated     Maintain my current  health status, stay as healthy and as independent as possible. Enjoy life and family.       Fall Risk Fall Risk  06/14/2018 06/12/2017 03/21/2016 08/24/2015 04/24/2014  Falls in the past year? 0 No No Yes No  Number falls in past yr: 0 - - 1 -  Injury with Fall? - - - No -    Depression Screen PHQ 2/9 Scores 06/14/2018 06/12/2017 03/21/2016 08/24/2015  PHQ - 2 Score 0 0 0 0  PHQ- 9 Score 0 0 - -     Cognitive Function MMSE - Mini Mental State Exam 06/12/2017  Orientation to time 5  Orientation to Place 5  Registration 3  Attention/ Calculation 5  Recall 2  Language- name 2 objects 2  Language- repeat 1  Language- follow 3 step command 3  Language- read & follow direction 1  Write a sentence 1  Copy design 1  Total score 29        Immunization History  Administered Date(s) Administered  . Influenza Whole 09/19/2007, 10/04/2008, 09/24/2009, 11/23/2011  . Influenza, High Dose Seasonal PF 10/24/2016, 09/05/2017  . Influenza,inj,Quad PF,6+ Mos 09/03/2013, 10/09/2014, 08/24/2015  . Influenza-Unspecified 10/03/2012  . Pneumococcal Conjugate-13 10/09/2014  . Pneumococcal Polysaccharide-23 02/13/2004, 09/24/2009  . Tdap 04/01/2015    Screening Tests Health Maintenance  Topic Date Due  . DEXA SCAN  08/24/1997  . INFLUENZA VACCINE  08/04/2018  . TETANUS/TDAP  03/31/2025  . PNA vac Low Risk Adult  Completed      Plan:    Reviewed health maintenance screenings with patient today and relevant education, vaccines, and/or referrals were provided.   Continue to eat heart healthy diet (full of fruits, vegetables, whole grains, lean protein, water--limit salt, fat, and sugar intake) and increase physical activity as tolerated.  Continue doing brain stimulating activities (puzzles, reading, adult coloring books, staying active) to keep memory sharp.   I have personally reviewed and noted the following in the patient's chart:   . Medical and social history . Use of alcohol, tobacco or illicit drugs  . Current medications and supplements . Functional ability and status . Nutritional status . Physical activity . Advanced directives . List of other physicians . Screenings to include cognitive, depression, and falls . Referrals and appointments  In addition, I have reviewed and discussed with patient certain preventive protocols, quality metrics, and best practice recommendations. A written personalized care plan for preventive services as well as general preventive health recommendations were provided to patient.     Michiel Cowboy, RN  06/14/2018

## 2018-06-14 ENCOUNTER — Ambulatory Visit (INDEPENDENT_AMBULATORY_CARE_PROVIDER_SITE_OTHER): Payer: Medicare Other | Admitting: *Deleted

## 2018-06-14 DIAGNOSIS — Z0000 Encounter for general adult medical examination without abnormal findings: Secondary | ICD-10-CM

## 2018-06-14 NOTE — Progress Notes (Signed)
Medical screening examination/treatment/procedure(s) were performed by non-physician practitioner and as supervising physician I was immediately available for consultation/collaboration. I agree with above. Esco Joslyn A Urho Rio, MD 

## 2018-09-13 ENCOUNTER — Other Ambulatory Visit: Payer: Self-pay | Admitting: Internal Medicine

## 2018-10-08 ENCOUNTER — Other Ambulatory Visit: Payer: Self-pay | Admitting: Internal Medicine

## 2018-10-18 ENCOUNTER — Other Ambulatory Visit: Payer: Self-pay | Admitting: Internal Medicine

## 2018-12-25 ENCOUNTER — Other Ambulatory Visit: Payer: Self-pay | Admitting: Internal Medicine

## 2019-01-18 ENCOUNTER — Other Ambulatory Visit: Payer: Self-pay | Admitting: Internal Medicine

## 2019-01-24 ENCOUNTER — Other Ambulatory Visit: Payer: Self-pay | Admitting: Internal Medicine

## 2019-01-29 ENCOUNTER — Ambulatory Visit: Payer: Medicare Other

## 2019-02-07 ENCOUNTER — Ambulatory Visit: Payer: Medicare PPO | Attending: Internal Medicine

## 2019-02-17 ENCOUNTER — Other Ambulatory Visit: Payer: Self-pay | Admitting: Internal Medicine

## 2019-02-18 ENCOUNTER — Telehealth: Payer: Self-pay | Admitting: Internal Medicine

## 2019-02-18 NOTE — Telephone Encounter (Signed)
Called [facility] spoke w/pharmacist " Arnette Norris" he states need clarification for medications instruction. Received request back but w/their notes. Inform Philip both meds was to take 1 tab po daily.Marland KitchenJohny Chess

## 2019-02-18 NOTE — Telephone Encounter (Signed)
   Pt c/o medication issue:  1. Name of Medication:  levothyroxine (SYNTHROID) 50 MCG tablet [facility] VITAMIN B12 1000 MCG tablet  2. How are you currently taking this medication (dosage and times per day)? n/a  3. Are you having a reaction (difficulty breathing--STAT)? no  4. What is your medication issue? Pharmacy requesting clarification on instructions

## 2019-03-14 ENCOUNTER — Other Ambulatory Visit: Payer: Self-pay | Admitting: Internal Medicine

## 2019-03-28 DIAGNOSIS — F419 Anxiety disorder, unspecified: Secondary | ICD-10-CM | POA: Diagnosis not present

## 2019-03-28 DIAGNOSIS — E785 Hyperlipidemia, unspecified: Secondary | ICD-10-CM | POA: Diagnosis not present

## 2019-03-28 DIAGNOSIS — E039 Hypothyroidism, unspecified: Secondary | ICD-10-CM | POA: Diagnosis not present

## 2019-03-28 DIAGNOSIS — F909 Attention-deficit hyperactivity disorder, unspecified type: Secondary | ICD-10-CM | POA: Diagnosis not present

## 2019-03-28 DIAGNOSIS — Z96649 Presence of unspecified artificial hip joint: Secondary | ICD-10-CM | POA: Diagnosis not present

## 2019-03-28 DIAGNOSIS — F329 Major depressive disorder, single episode, unspecified: Secondary | ICD-10-CM | POA: Diagnosis not present

## 2019-03-28 DIAGNOSIS — E669 Obesity, unspecified: Secondary | ICD-10-CM | POA: Diagnosis not present

## 2019-03-28 DIAGNOSIS — Z6838 Body mass index (BMI) 38.0-38.9, adult: Secondary | ICD-10-CM | POA: Diagnosis not present

## 2019-03-28 DIAGNOSIS — I10 Essential (primary) hypertension: Secondary | ICD-10-CM | POA: Diagnosis not present

## 2019-03-28 DIAGNOSIS — F349 Persistent mood [affective] disorder, unspecified: Secondary | ICD-10-CM | POA: Diagnosis not present

## 2019-04-02 ENCOUNTER — Other Ambulatory Visit: Payer: Self-pay | Admitting: Internal Medicine

## 2019-04-19 ENCOUNTER — Other Ambulatory Visit: Payer: Self-pay | Admitting: Internal Medicine

## 2019-04-29 ENCOUNTER — Other Ambulatory Visit: Payer: Self-pay

## 2019-04-29 ENCOUNTER — Encounter: Payer: Self-pay | Admitting: Internal Medicine

## 2019-04-29 ENCOUNTER — Ambulatory Visit (INDEPENDENT_AMBULATORY_CARE_PROVIDER_SITE_OTHER): Payer: Medicare PPO | Admitting: Internal Medicine

## 2019-04-29 VITALS — BP 124/70 | HR 82 | Temp 98.0°F | Ht 67.0 in | Wt 226.2 lb

## 2019-04-29 DIAGNOSIS — I10 Essential (primary) hypertension: Secondary | ICD-10-CM

## 2019-04-29 DIAGNOSIS — F325 Major depressive disorder, single episode, in full remission: Secondary | ICD-10-CM | POA: Diagnosis not present

## 2019-04-29 DIAGNOSIS — E039 Hypothyroidism, unspecified: Secondary | ICD-10-CM | POA: Diagnosis not present

## 2019-04-29 DIAGNOSIS — Z0000 Encounter for general adult medical examination without abnormal findings: Secondary | ICD-10-CM | POA: Diagnosis not present

## 2019-04-29 DIAGNOSIS — E782 Mixed hyperlipidemia: Secondary | ICD-10-CM | POA: Diagnosis not present

## 2019-04-29 LAB — CBC
HCT: 40.8 % (ref 36.0–46.0)
Hemoglobin: 13.8 g/dL (ref 12.0–15.0)
MCHC: 33.7 g/dL (ref 30.0–36.0)
MCV: 96.9 fl (ref 78.0–100.0)
Platelets: 244 10*3/uL (ref 150.0–400.0)
RBC: 4.22 Mil/uL (ref 3.87–5.11)
RDW: 13.6 % (ref 11.5–15.5)
WBC: 5.5 10*3/uL (ref 4.0–10.5)

## 2019-04-29 LAB — COMPREHENSIVE METABOLIC PANEL
ALT: 21 U/L (ref 0–35)
AST: 24 U/L (ref 0–37)
Albumin: 4.3 g/dL (ref 3.5–5.2)
Alkaline Phosphatase: 53 U/L (ref 39–117)
BUN: 29 mg/dL — ABNORMAL HIGH (ref 6–23)
CO2: 30 mEq/L (ref 19–32)
Calcium: 10 mg/dL (ref 8.4–10.5)
Chloride: 98 mEq/L (ref 96–112)
Creatinine, Ser: 1.36 mg/dL — ABNORMAL HIGH (ref 0.40–1.20)
GFR: 36.81 mL/min — ABNORMAL LOW (ref >60.00)
Glucose, Bld: 104 mg/dL — ABNORMAL HIGH (ref 70–99)
Potassium: 4 mEq/L (ref 3.5–5.1)
Sodium: 136 mEq/L (ref 135–145)
Total Bilirubin: 0.8 mg/dL (ref 0.2–1.2)
Total Protein: 7.4 g/dL (ref 6.0–8.3)

## 2019-04-29 LAB — LIPID PANEL
Cholesterol: 166 mg/dL (ref 0–200)
HDL: 55.2 mg/dL (ref >39.00)
LDL Cholesterol: 83 mg/dL (ref 0–99)
NonHDL: 110.66
Total CHOL/HDL Ratio: 3
Triglycerides: 136 mg/dL (ref 0.0–149.0)
VLDL: 27.2 mg/dL (ref 0.0–40.0)

## 2019-04-29 MED ORDER — SIMVASTATIN 20 MG PO TABS: ORAL_TABLET | ORAL | 3 refills | Status: DC

## 2019-04-29 MED ORDER — AMITRIPTYLINE HCL 25 MG PO TABS: 50.0000 mg | ORAL_TABLET | Freq: Every day | ORAL | 3 refills | Status: DC

## 2019-04-29 MED ORDER — LEVOTHYROXINE SODIUM 50 MCG PO TABS: 50.0000 ug | ORAL_TABLET | Freq: Every day | ORAL | 3 refills | Status: DC

## 2019-04-29 MED ORDER — HYDROCHLOROTHIAZIDE 25 MG PO TABS: 25.0000 mg | ORAL_TABLET | Freq: Every day | ORAL | 3 refills | Status: DC

## 2019-04-29 MED ORDER — CITALOPRAM HYDROBROMIDE 20 MG PO TABS: 20.0000 mg | ORAL_TABLET | Freq: Every day | ORAL | 3 refills | Status: DC

## 2019-04-29 NOTE — Assessment & Plan Note (Signed)
Flu shot due next season. Pneumonia complete. Shingrix counseled. Tetanus up to date. Colonoscopy aged out. Mammogram aged out, pap smear aged out and dexa declines further. Covid-19 complete. Counseled about sun safety and mole surveillance. Counseled about the dangers of distracted driving. Given 10 year screening recommendations.

## 2019-04-29 NOTE — Assessment & Plan Note (Signed)
Taking celexa and doing well. Refilled today.

## 2019-04-29 NOTE — Assessment & Plan Note (Signed)
Taking simvastatin 20 mg daily. Checking lipid panel and adjust as needed.  °

## 2019-04-29 NOTE — Patient Instructions (Signed)
Health Maintenance, Female Adopting a healthy lifestyle and getting preventive care are important in promoting health and wellness. Ask your health care provider about:  The right schedule for you to have regular tests and exams.  Things you can do on your own to prevent diseases and keep yourself healthy. What should I know about diet, weight, and exercise? Eat a healthy diet   Eat a diet that includes plenty of vegetables, fruits, low-fat dairy products, and lean protein.  Do not eat a lot of foods that are high in solid fats, added sugars, or sodium. Maintain a healthy weight Body mass index (BMI) is used to identify weight problems. It estimates body fat based on height and weight. Your health care provider can help determine your BMI and help you achieve or maintain a healthy weight. Get regular exercise Get regular exercise. This is one of the most important things you can do for your health. Most adults should:  Exercise for at least 150 minutes each week. The exercise should increase your heart rate and make you sweat (moderate-intensity exercise).  Do strengthening exercises at least twice a week. This is in addition to the moderate-intensity exercise.  Spend less time sitting. Even light physical activity can be beneficial. Watch cholesterol and blood lipids Have your blood tested for lipids and cholesterol at 84 years of age, then have this test every 5 years. Have your cholesterol levels checked more often if:  Your lipid or cholesterol levels are high.  You are older than 84 years of age.  You are at high risk for heart disease. What should I know about cancer screening? Depending on your health history and family history, you may need to have cancer screening at various ages. This may include screening for:  Breast cancer.  Cervical cancer.  Colorectal cancer.  Skin cancer.  Lung cancer. What should I know about heart disease, diabetes, and high blood  pressure? Blood pressure and heart disease  High blood pressure causes heart disease and increases the risk of stroke. This is more likely to develop in people who have high blood pressure readings, are of African descent, or are overweight.  Have your blood pressure checked: ? Every 3-5 years if you are 18-39 years of age. ? Every year if you are 40 years old or older. Diabetes Have regular diabetes screenings. This checks your fasting blood sugar level. Have the screening done:  Once every three years after age 40 if you are at a normal weight and have a low risk for diabetes.  More often and at a younger age if you are overweight or have a high risk for diabetes. What should I know about preventing infection? Hepatitis B If you have a higher risk for hepatitis B, you should be screened for this virus. Talk with your health care provider to find out if you are at risk for hepatitis B infection. Hepatitis C Testing is recommended for:  Everyone born from 1945 through 1965.  Anyone with known risk factors for hepatitis C. Sexually transmitted infections (STIs)  Get screened for STIs, including gonorrhea and chlamydia, if: ? You are sexually active and are younger than 84 years of age. ? You are older than 84 years of age and your health care provider tells you that you are at risk for this type of infection. ? Your sexual activity has changed since you were last screened, and you are at increased risk for chlamydia or gonorrhea. Ask your health care provider if   you are at risk.  Ask your health care provider about whether you are at high risk for HIV. Your health care provider may recommend a prescription medicine to help prevent HIV infection. If you choose to take medicine to prevent HIV, you should first get tested for HIV. You should then be tested every 3 months for as long as you are taking the medicine. Pregnancy  If you are about to stop having your period (premenopausal) and  you may become pregnant, seek counseling before you get pregnant.  Take 400 to 800 micrograms (mcg) of folic acid every day if you become pregnant.  Ask for birth control (contraception) if you want to prevent pregnancy. Osteoporosis and menopause Osteoporosis is a disease in which the bones lose minerals and strength with aging. This can result in bone fractures. If you are 65 years old or older, or if you are at risk for osteoporosis and fractures, ask your health care provider if you should:  Be screened for bone loss.  Take a calcium or vitamin D supplement to lower your risk of fractures.  Be given hormone replacement therapy (HRT) to treat symptoms of menopause. Follow these instructions at home: Lifestyle  Do not use any products that contain nicotine or tobacco, such as cigarettes, e-cigarettes, and chewing tobacco. If you need help quitting, ask your health care provider.  Do not use street drugs.  Do not share needles.  Ask your health care provider for help if you need support or information about quitting drugs. Alcohol use  Do not drink alcohol if: ? Your health care provider tells you not to drink. ? You are pregnant, may be pregnant, or are planning to become pregnant.  If you drink alcohol: ? Limit how much you use to 0-1 drink a day. ? Limit intake if you are breastfeeding.  Be aware of how much alcohol is in your drink. In the [place] one drink equals one 12 oz bottle of beer (355 mL), one 5 oz glass of wine (148 mL), or one 1 oz glass of hard liquor (44 mL). General instructions  Schedule regular health, dental, and eye exams.  Stay current with your vaccines.  Tell your health care provider if: ? You often feel depressed. ? You have ever been abused or do not feel safe at home. Summary  Adopting a healthy lifestyle and getting preventive care are important in promoting health and wellness.  Follow your health care provider's instructions about healthy  diet, exercising, and getting tested or screened for diseases.  Follow your health care provider's instructions on monitoring your cholesterol and blood pressure. This information is not intended to replace advice given to you by your health care provider. Make sure you discuss any questions you have with your health care provider. Document Revised: 12/13/2017 Document Reviewed: 12/13/2017 Elsevier Patient Education  2020 Elsevier Inc.  

## 2019-04-29 NOTE — Assessment & Plan Note (Signed)
BP at goal on hctz 25 mg daily. Checking CMP and adjust as needed.

## 2019-04-29 NOTE — Progress Notes (Signed)
   Subjective:   Patient ID: Daisy Bryant, female    DOB: [birth year], 86 y.o.   MRN: [id number]  HPI The patient is an 86 YO female coming in for physical.   PMH, [place], social history reviewed and updated  Review of Systems  Constitutional: Negative.   HENT: Negative.   Eyes: Negative.   Respiratory: Negative for cough, chest tightness and shortness of breath.   Cardiovascular: Negative for chest pain, palpitations and leg swelling.  Gastrointestinal: Negative for abdominal distention, abdominal pain, constipation, diarrhea, nausea and vomiting.  Musculoskeletal: Positive for arthralgias.  Skin: Negative.   Neurological: Negative.   Psychiatric/Behavioral: Negative.     Objective:  Physical Exam Constitutional:      Appearance: She is well-developed.  HENT:     Head: Normocephalic and atraumatic.  Cardiovascular:     Rate and Rhythm: Normal rate and regular rhythm.  Pulmonary:     Effort: Pulmonary effort is normal. No respiratory distress.     Breath sounds: Normal breath sounds. No wheezing or rales.  Abdominal:     General: Bowel sounds are normal. There is no distension.     Palpations: Abdomen is soft.     Tenderness: There is no abdominal tenderness. There is no rebound.  Musculoskeletal:     Cervical back: Normal range of motion.  Skin:    General: Skin is warm and dry.  Neurological:     Mental Status: She is alert and oriented to person, place, and time.     Coordination: Coordination normal.     Vitals:   04/29/19 1113  BP: 124/70  Pulse: 82  Temp: 98 F (36.7 C)  SpO2: 99%  Weight: 226 lb 3.2 oz (102.6 kg)  Height: 5\' 7"  (1.702 m)    This visit occurred during the SARS-CoV-2 public health emergency.  Safety protocols were in place, including screening questions prior to the visit, additional usage of staff PPE, and extensive cleaning of exam room while observing appropriate contact time as indicated for disinfecting solutions.   Assessment & Plan:

## 2019-04-29 NOTE — Assessment & Plan Note (Signed)
Checking TSH and adjust synthroid dosing as needed.

## 2019-05-03 ENCOUNTER — Telehealth: Payer: Self-pay | Admitting: Internal Medicine

## 2019-05-03 NOTE — Telephone Encounter (Signed)
Called patient and discussed labs

## 2019-05-03 NOTE — Telephone Encounter (Signed)
    Patients daughter calling to verify phone number for patient is correct.  Please call with lab results.

## 2019-06-20 ENCOUNTER — Ambulatory Visit (INDEPENDENT_AMBULATORY_CARE_PROVIDER_SITE_OTHER): Payer: Medicare PPO

## 2019-06-20 ENCOUNTER — Other Ambulatory Visit: Payer: Self-pay

## 2019-06-20 VITALS — BP 124/80 | HR 77 | Temp 97.8°F | Ht 67.0 in | Wt 224.6 lb

## 2019-06-20 DIAGNOSIS — Z0000 Encounter for general adult medical examination without abnormal findings: Secondary | ICD-10-CM | POA: Diagnosis not present

## 2019-06-20 NOTE — Progress Notes (Signed)
Subjective:   Daisy Bryant is a 86 y.o. female who presents for Medicare Annual (Subsequent) preventive examination.  Review of Systems:  No ROS. Medicare Wellness Visit. Additional risk factors are reflected in social history. Cardiac Risk Factors include: advanced age (>50men, >45 women);dyslipidemia;hypertension;obesity (BMI >30kg/m2)     Objective:     Vitals: BP 124/80   Pulse 77   Temp 97.8 F (36.6 C)   Ht 5\' 7"  (1.702 m)   Wt 224 lb 9.6 oz (101.9 kg)   SpO2 97%   BMI 35.18 kg/m   Body mass index is 35.18 kg/m.  Advanced Directives 06/20/2019 06/14/2018 06/12/2017 11/08/2011  Does Patient Have a Medical Advance Directive? Yes Yes Yes Patient has advance directive, copy not in chart  Type of Advance Directive - [place];Living will [place];Living will [place];Living will  Does patient want to make changes to medical advance directive? No - Patient declined - - -  Copy of [place] in Chart? - No - copy requested No - copy requested Copy requested from family  Pre-existing out of facility DNR order (yellow form or pink MOST form) - - - No    Tobacco Social History   Tobacco Use  Smoking Status Never Smoker  Smokeless Tobacco Never Used     Counseling given: No   Clinical Intake:  Pre-visit preparation completed: Yes  Pain : No/denies pain Pain Score: 0-No pain     BMI - recorded: 35.18 Nutritional Risks: None Diabetes: No  How often do you need to have someone help you when you read instructions, pamphlets, or other written materials from your doctor or pharmacy?: 1 - Never What is the last grade level you completed in school?: Master's Degree  Interpreter Needed?: No  Information entered by :: Daisy Soledad N. Lowell Guitar, LPN  Past Medical History:  Diagnosis Date  . Acute gastric ulcer without mention of hemorrhage, perforation, or obstruction 2002  . Anemia   . Anxiety    . Arthritis   . Cancer ([place])   . Colon polyp 2010   HYPERPLASTIC POLYP  . Depression   . Diverticulosis of colon (without mention of hemorrhage)   . GERD (gastroesophageal reflux disease)   . H/O hiatal hernia 2002  . Hypertension   . Hypothyroidism   . Neuromuscular disorder ([place])   . Stricture and stenosis of esophagus 2002  . Tuberculosis    Past Surgical History:  Procedure Laterality Date  . BREAST SURGERY    . ENTEROSCOPY  11/09/2011   Procedure: ENTEROSCOPY;  Surgeon: Inda Castle, MD;  Location: [facility];  Service: Endoscopy;  Laterality: N/A;  . EYE SURGERY    . JOINT REPLACEMENT Left    knee  . MASTECTOMY    . TONSILLECTOMY  under age 41   Family History  Problem Relation Age of Onset  . Pancreatic cancer Father    Social History   Socioeconomic History  . Marital status: Widowed    Spouse name: Not on file  . Number of children: 4  . Years of education: 60  . Highest education level: Not on file  Occupational History  . Occupation: counseling -retired  Tobacco Use  . Smoking status: Never Smoker  . Smokeless tobacco: Never Used  Vaping Use  . Vaping Use: Never used  Substance and Sexual Activity  . Alcohol use: Yes    Alcohol/week: 1.0 standard drink    Types: 1 drink(s)  per week    Comment: wine  . Drug use: No  . Sexual activity: Never  Other Topics Concern  . 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 Married '55 - 2 years/divorced, married '65 - 2007/widowed. 2 sons - '55, '69; 2 dtr - '56, '68; 3 grandchildren. LIves alone and is independent all ADLs.  ACP- no CPR,  OK for short term mechanical ventilation, no heroic or futile measure in the face of irreversible disease.    Social Determinants of Health   Financial Resource Strain:   . Difficulty of Paying Living Expenses:   Food Insecurity:   . Worried About Charity fundraiser in the Last Year:   .  Arboriculturist in the Last Year:   Transportation Needs:   . Film/video editor (Medical):   Marland Kitchen Lack of Transportation (Non-Medical):   Physical Activity:   . Days of Exercise per Week:   . Minutes of Exercise per Session:   Stress:   . Feeling of Stress :   Social Connections:   . Frequency of Communication with Friends and Family:   . Frequency of Social Gatherings with Friends and Family:   . Attends Religious Services:   . Active Member of Clubs or Organizations:   . Attends Archivist Meetings:   Marland Kitchen Marital Status:     Outpatient Encounter Medications as of 06/20/2019  Medication Sig  . amitriptyline (ELAVIL) 25 MG tablet Take 2 tablets (50 mg total) by mouth at bedtime.  . Cholecalciferol (VITAMIN D3) 1000 units CAPS Take by mouth.  . citalopram (CELEXA) 20 MG tablet Take 1 tablet (20 mg total) by mouth daily.  . [facility] VITAMIN B12 1000 MCG tablet PLEASE SEE ATTACHED FOR DETAILED DIRECTIONS  . hydrochlorothiazide (HYDRODIURIL) 25 MG tablet Take 1 tablet (25 mg total) by mouth daily.  Marland Kitchen levothyroxine (SYNTHROID) 50 MCG tablet Take 1 tablet (50 mcg total) by mouth daily before breakfast.  . Probiotic Product (PROBIOTIC DAILY PO) Take by mouth daily.  . simvastatin (ZOCOR) 20 MG tablet TAKE 1 TABLET BY MOUTH EVERY DAY IN THE EVENING   No facility-administered encounter medications on file as of 06/20/2019.    Activities of Daily Living In your present state of health, do you have any difficulty performing the following activities: 06/20/2019  Hearing? N  Vision? N  Difficulty concentrating or making decisions? N  Walking or climbing stairs? N  Dressing or bathing? N  Doing errands, shopping? N  Preparing Food and eating ? N  Using the Toilet? N  In the past six months, have you accidently leaked urine? N  Do you have problems with loss of bowel control? N  Managing your Medications? N  Managing your Finances? N  Housekeeping or managing your Housekeeping? N    Some recent data might be hidden    Patient Care Team: Hoyt Koch, MD as PCP - General (Internal Medicine)    Assessment:   This is a routine wellness examination for Daisy Bryant.  Exercise Activities and Dietary recommendations Current Exercise Habits: Home exercise routine, Type of exercise: walking (walks the dog everyday), Time (Minutes): 25, Frequency (Times/Week): 7, Weekly Exercise (Minutes/Week): 175, Intensity: Moderate, Exercise limited by: None identified  Goals    . Patient Stated     Maintain my current health status, stay as healthy and as independent as possible. Enjoy life and family.  Fall Risk Fall Risk  06/20/2019 06/14/2018 06/12/2017 03/21/2016 08/24/2015  Falls in the past year? 0 0 No No Yes  Number falls in past yr: 0 0 - - 1  Injury with Fall? 0 - - - No  Risk for fall due to : No Fall Risks - - - -  Follow up Falls evaluation completed;Education provided - - - -   Is the patient's home free of loose throw rugs in walkways, pet beds, electrical cords, etc?   yes      Grab bars in the bathroom? yes      Handrails on the stairs?   yes      Adequate lighting?   yes  Timed Get Up and Go performed: not indicated  Depression Screen PHQ 2/9 Scores 06/20/2019 04/29/2019 06/14/2018 06/12/2017  PHQ - 2 Score 0 0 0 0  PHQ- 9 Score - 0 0 0     Cognitive Function MMSE - Mini Mental State Exam 06/12/2017  Orientation to time 5  Orientation to Place 5  Registration 3  Attention/ Calculation 5  Recall 2  Language- name 2 objects 2  Language- repeat 1  Language- follow 3 step command 3  Language- read & follow direction 1  Write a sentence 1  Copy design 1  Total score 29     6CIT Screen 06/20/2019  What Year? 0 points  What month? 0 points  What time? 0 points  Count back from 20 0 points  Months in reverse 0 points  Repeat phrase 2 points  Total Score 2    Immunization History  Administered Date(s) Administered  . Influenza Whole  09/19/2007, 10/04/2008, 09/24/2009, 11/23/2011  . Influenza, High Dose Seasonal PF 10/24/2016, 09/05/2017  . Influenza,inj,Quad PF,6+ Mos 09/03/2013, 10/09/2014, 08/24/2015  . Influenza-Unspecified 10/03/2012  . Moderna SARS-COVID-2 Vaccination 01/15/2019, 02/12/2019  . Pneumococcal Conjugate-13 10/09/2014  . Pneumococcal Polysaccharide-23 02/13/2004, 09/24/2009  . Tdap 04/01/2015    Qualifies for Shingles Vaccine? Yes  Screening Tests Health Maintenance  Topic Date Due  . DEXA SCAN  Never done  . INFLUENZA VACCINE  08/04/2019  . TETANUS/TDAP  03/31/2025  . COVID-19 Vaccine  Completed  . PNA vac Low Risk Adult  Completed    Cancer Screenings: Lung: Low Dose CT Chest recommended if Age 59-80 years, 30 pack-year currently smoking OR have quit w/in 15years. Patient does not qualify. Breast:  Up to date on Mammogram? No   Up to date of Bone Density/Dexa? No Colorectal: not a candidate for colon cancer screening due to age  Additional Screenings: Hepatitis C Screening:  Never done     Plan:     Reviewed health maintenance screenings with patient today and relevant education, vaccines, and/or referrals were provided.    Continue doing brain stimulating activities (puzzles, reading, adult coloring books, staying active) to keep memory sharp.    Continue to eat heart healthy diet (full of fruits, vegetables, whole grains, lean protein, water--limit salt, fat, and sugar intake) and increase physical activity as tolerated.  I have personally reviewed and noted the following in the patient's chart:   . Medical and social history . Use of alcohol, tobacco or illicit drugs  . Current medications and supplements . Functional ability and status . Nutritional status . Physical activity . Advanced directives . List of other physicians . Hospitalizations, surgeries, and ER visits in previous 12 months . Vitals . Screenings to include cognitive, depression, and falls . Referrals and  appointments  In addition, I  have reviewed and discussed with patient certain preventive protocols, quality metrics, and best practice recommendations. A written personalized care plan for preventive services as well as general preventive health recommendations were provided to patient.     Sheral Flow, LPN  [id number]  Nurse Health Advisor

## 2019-06-20 NOTE — Patient Instructions (Signed)
Daisy Bryant , Thank you for taking time to come for your Medicare Wellness Visit. I appreciate your ongoing commitment to your health goals. Please review the following plan we discussed and let me know if I can assist you in the future.   Screening recommendations/referrals: Colonoscopy: no repeat due to age  86: no repeat due to age Bone Density: never done Recommended yearly ophthalmology/optometry visit for glaucoma screening and checkup Recommended yearly dental visit for hygiene and checkup  Vaccinations: Influenza vaccine: 10/29/2018 Pneumococcal vaccine: completed Tdap vaccine: 04/01/2015; due 2027 Shingles vaccine: will check with pharmacy Covid-19: completed  Advanced directives: Please bring a copy of your health care power of attorney and living will to the office at your convenience.  Conditions/risks identified: Please continue to do your personal lifestyle choices by: daily care of teeth and gums, regular physical activity (goal should be 5 days a week for 30 minutes), eat a healthy diet, avoid tobacco and drug use, limiting any alcohol intake, taking a low-dose aspirin (if not allergic or have been advised by your provider otherwise) and taking vitamins and minerals as recommended by your provider.  Next appointment: Please schedule your next Medicare Wellness Visit with your Nurse Health Advisor in 1 year.   Preventive Care 49 Years and Older, Female Preventive care refers to lifestyle choices and visits with your health care provider that can promote health and wellness. What does preventive care include?  A yearly physical exam. This is also called an annual well check.  Dental exams once or twice a year.  Routine eye exams. Ask your health care provider how often you should have your eyes checked.  Personal lifestyle choices, including:  Daily care of your teeth and gums.  Regular physical activity.  Eating a healthy diet.  Avoiding tobacco and drug  use.  Limiting alcohol use.  Practicing safe sex.  Taking low-dose aspirin every day.  Taking vitamin and mineral supplements as recommended by your health care provider. What happens during an annual well check? The services and screenings done by your health care provider during your annual well check will depend on your age, overall health, lifestyle risk factors, and family history of disease. Counseling  Your health care provider may ask you questions about your:  Alcohol use.  Tobacco use.  Drug use.  Emotional well-being.  Home and relationship well-being.  Sexual activity.  Eating habits.  History of falls.  Memory and ability to understand (cognition).  Work and work Statistician.  Reproductive health. Screening  You may have the following tests or measurements:  Height, weight, and BMI.  Blood pressure.  Lipid and cholesterol levels. These may be checked every 5 years, or more frequently if you are over 36 years old.  Skin check.  Lung cancer screening. You may have this screening every year starting at age 18 if you have a 30-pack-year history of smoking and currently smoke or have quit within the past 15 years.  Fecal occult blood test (FOBT) of the stool. You may have this test every year starting at age 17.  Flexible sigmoidoscopy or colonoscopy. You may have a sigmoidoscopy every 5 years or a colonoscopy every 10 years starting at age 29.  Hepatitis C blood test.  Hepatitis B blood test.  Sexually transmitted disease (STD) testing.  Diabetes screening. This is done by checking your blood sugar (glucose) after you have not eaten for a while (fasting). You may have this done every 1-3 years.  Bone density scan. This  is done to screen for osteoporosis. You may have this done starting at age 4.  Mammogram. This may be done every 1-2 years. Talk to your health care provider about how often you should have regular mammograms. Talk with your  health care provider about your test results, treatment options, and if necessary, the need for more tests. Vaccines  Your health care provider may recommend certain vaccines, such as:  Influenza vaccine. This is recommended every year.  Tetanus, diphtheria, and acellular pertussis (Tdap, Td) vaccine. You may need a Td booster every 10 years.  Zoster vaccine. You may need this after age 86.  Pneumococcal 13-valent conjugate (PCV13) vaccine. One dose is recommended after age 44.  Pneumococcal polysaccharide (PPSV23) vaccine. One dose is recommended after age 64. Talk to your health care provider about which screenings and vaccines you need and how often you need them. This information is not intended to replace advice given to you by your health care provider. Make sure you discuss any questions you have with your health care provider. Document Released: 01/16/2015 Document Revised: 09/09/2015 Document Reviewed: 10/21/2014 Elsevier Interactive Patient Education  2017 [employer] Prevention in the Home Falls can cause injuries. They can happen to people of all ages. There are many things you can do to make your home safe and to help prevent falls. What can I do on the outside of my home?  Regularly fix the edges of walkways and driveways and fix any cracks.  Remove anything that might make you trip as you walk through a door, such as a raised step or threshold.  Trim any bushes or trees on the path to your home.  Use bright outdoor lighting.  Clear any walking paths of anything that might make someone trip, such as rocks or tools.  Regularly check to see if handrails are loose or broken. Make sure that both sides of any steps have handrails.  Any raised decks and porches should have guardrails on the edges.  Have any leaves, snow, or ice cleared regularly.  Use sand or salt on walking paths during winter.  Clean up any spills in your garage right away. This includes oil  or grease spills. What can I do in the bathroom?  Use night lights.  Install grab bars by the toilet and in the tub and shower. Do not use towel bars as grab bars.  Use non-skid mats or decals in the tub or shower.  If you need to sit down in the shower, use a plastic, non-slip stool.  Keep the floor dry. Clean up any water that spills on the floor as soon as it happens.  Remove soap buildup in the tub or shower regularly.  Attach bath mats securely with double-sided non-slip rug tape.  Do not have throw rugs and other things on the floor that can make you trip. What can I do in the bedroom?  Use night lights.  Make sure that you have a light by your bed that is easy to reach.  Do not use any sheets or blankets that are too big for your bed. They should not hang down onto the floor.  Have a firm chair that has side arms. You can use this for support while you get dressed.  Do not have throw rugs and other things on the floor that can make you trip. What can I do in the kitchen?  Clean up any spills right away.  Avoid walking on wet floors.  Keep items that you use a lot in easy-to-reach places.  If you need to reach something above you, use a strong step stool that has a grab bar.  Keep electrical cords out of the way.  Do not use floor polish or wax that makes floors slippery. If you must use wax, use non-skid floor wax.  Do not have throw rugs and other things on the floor that can make you trip. What can I do with my stairs?  Do not leave any items on the stairs.  Make sure that there are handrails on both sides of the stairs and use them. Fix handrails that are broken or loose. Make sure that handrails are as long as the stairways.  Check any carpeting to make sure that it is firmly attached to the stairs. Fix any carpet that is loose or worn.  Avoid having throw rugs at the top or bottom of the stairs. If you do have throw rugs, attach them to the floor with  carpet tape.  Make sure that you have a light switch at the top of the stairs and the bottom of the stairs. If you do not have them, ask someone to add them for you. What else can I do to help prevent falls?  Wear shoes that:  Do not have high heels.  Have rubber bottoms.  Are comfortable and fit you well.  Are closed at the toe. Do not wear sandals.  If you use a stepladder:  Make sure that it is fully opened. Do not climb a closed stepladder.  Make sure that both sides of the stepladder are locked into place.  Ask someone to hold it for you, if possible.  Clearly mark and make sure that you can see:  Any grab bars or handrails.  First and last steps.  Where the edge of each step is.  Use tools that help you move around (mobility aids) if they are needed. These include:  Canes.  Walkers.  Scooters.  Crutches.  Turn on the lights when you go into a dark area. Replace any light bulbs as soon as they burn out.  Set up your furniture so you have a clear path. Avoid moving your furniture around.  If any of your floors are uneven, fix them.  If there are any pets around you, be aware of where they are.  Review your medicines with your doctor. Some medicines can make you feel dizzy. This can increase your chance of falling. Ask your doctor what other things that you can do to help prevent falls. This information is not intended to replace advice given to you by your health care provider. Make sure you discuss any questions you have with your health care provider. Document Released: 10/16/2008 Document Revised: 05/28/2015 Document Reviewed: 01/24/2014 Elsevier Interactive Patient Education  2017 [employer].

## 2019-07-04 ENCOUNTER — Telehealth (INDEPENDENT_AMBULATORY_CARE_PROVIDER_SITE_OTHER): Payer: Medicare PPO | Admitting: Internal Medicine

## 2019-07-04 ENCOUNTER — Encounter: Payer: Self-pay | Admitting: Internal Medicine

## 2019-07-04 DIAGNOSIS — J189 Pneumonia, unspecified organism: Secondary | ICD-10-CM | POA: Diagnosis not present

## 2019-07-04 HISTORY — DX: Pneumonia, unspecified organism: J18.9

## 2019-07-04 MED ORDER — DOXYCYCLINE HYCLATE 100 MG PO TABS
100.0000 mg | ORAL_TABLET | Freq: Two times a day (BID) | ORAL | 0 refills | Status: DC
Start: 2019-07-04 — End: 2020-06-16

## 2019-07-04 MED ORDER — HYDROCODONE-HOMATROPINE 5-1.5 MG/5ML PO SYRP: 5.0000 mL | ORAL_SOLUTION | Freq: Three times a day (TID) | ORAL | 0 refills | Status: DC | PRN

## 2019-07-04 NOTE — Progress Notes (Signed)
Virtual Visit via Video Note  I connected with Romilda Garret on 07/04/19 at  2:20 PM EDT by a video enabled telemedicine application and verified that I am speaking with the correct person using two identifiers.  The patient and the provider were at separate locations throughout the entire encounter. Patient location: home, Provider location: work   I discussed the limitations of evaluation and management by telemedicine and the availability of in person appointments. The patient expressed understanding and agreed to proceed. The patient and the provider were the only parties present for the visit unless noted in HPI below.  History of Present Illness: The patient is a 86 y.o. female with visit for cough with sputum. Started Monday this week and is overall worsening. Has cough with originally clear then white sputum. Coughing incessently and with rib pain also. Denies fevers but some chills. Some reduced appetite. Still drinking fluids. Not sleeping well due to coughing. Does have minimal sinus drainage. Some SOB mild overall. Activity has been limited since onset. Has been fully vaccinated against covid-19. Has tried mucinex otc which has not helped  Observations/Objective: Appearance: tired, breathing appears normal, coughing during visit, no dyspnea, casual grooming, abdomen does not appear distended, throat not well visualized, memory normal, mental status is A and O times 3  Assessment and Plan: See problem oriented charting  Follow Up Instructions: rx doxycycline and hycodan cough syrup  I discussed the assessment and treatment plan with the patient. The patient was provided an opportunity to ask questions and all were answered. The patient agreed with the plan and demonstrated an understanding of the instructions.   The patient was advised to call back or seek an in-person evaluation if the symptoms worsen or if the condition fails to improve as anticipated.  Hoyt Koch,  MD

## 2019-07-04 NOTE — Assessment & Plan Note (Signed)
Suspected due to new cough and sputum. Unable to get CXR today. Treating empirically with doxycycline and hycodan cough syrup. [place] narcotic database reviewed and no inappropriate fills.

## 2019-07-05 ENCOUNTER — Other Ambulatory Visit: Payer: Self-pay | Admitting: Family

## 2019-07-05 ENCOUNTER — Telehealth: Payer: Self-pay | Admitting: Internal Medicine

## 2019-07-05 MED ORDER — HYDROCODONE-HOMATROPINE 5-1.5 MG/5ML PO SYRP: 5.0000 mL | ORAL_SOLUTION | Freq: Three times a day (TID) | ORAL | 0 refills | Status: AC | PRN

## 2019-07-05 NOTE — Telephone Encounter (Signed)
Prescription sent to different pharmacy as requested.

## 2019-07-05 NOTE — Telephone Encounter (Signed)
New Message:   [facility] is calling and states that the HYDROcodone-homatropine (HYCODAN) 5-1.5 MG/5ML syrup that was called in for the pt they do not currently have this and would like for [place] to send [facility] [address]. He states the pt's daughter is aware that it will be sent to a different pharmacy. Please advise.

## 2019-07-10 ENCOUNTER — Other Ambulatory Visit: Payer: Self-pay | Admitting: Internal Medicine

## 2020-02-25 DIAGNOSIS — H52203 Unspecified astigmatism, bilateral: Secondary | ICD-10-CM | POA: Diagnosis not present

## 2020-02-25 DIAGNOSIS — H524 Presbyopia: Secondary | ICD-10-CM | POA: Diagnosis not present

## 2020-02-25 DIAGNOSIS — H43813 Vitreous degeneration, bilateral: Secondary | ICD-10-CM | POA: Diagnosis not present

## 2020-05-13 ENCOUNTER — Other Ambulatory Visit: Payer: Self-pay | Admitting: Internal Medicine

## 2020-06-10 ENCOUNTER — Telehealth: Payer: Self-pay | Admitting: Internal Medicine

## 2020-06-12 ENCOUNTER — Other Ambulatory Visit: Payer: Self-pay

## 2020-06-12 MED ORDER — LEVOTHYROXINE SODIUM 50 MCG PO TABS: 50.0000 ug | ORAL_TABLET | Freq: Every day | ORAL | 0 refills | Status: DC

## 2020-06-12 MED ORDER — SIMVASTATIN 20 MG PO TABS: ORAL_TABLET | ORAL | 0 refills | Status: DC

## 2020-06-12 MED ORDER — AMITRIPTYLINE HCL 25 MG PO TABS
50.0000 mg | ORAL_TABLET | Freq: Every day | ORAL | 0 refills | Status: DC
Start: 2020-06-12 — End: 2020-06-16

## 2020-06-12 MED ORDER — CITALOPRAM HYDROBROMIDE 20 MG PO TABS: 20.0000 mg | ORAL_TABLET | Freq: Every day | ORAL | 0 refills | Status: DC

## 2020-06-12 MED ORDER — HYDROCHLOROTHIAZIDE 25 MG PO TABS: 25.0000 mg | ORAL_TABLET | Freq: Every day | ORAL | 0 refills | Status: DC

## 2020-06-12 NOTE — Telephone Encounter (Signed)
1.Medication Requested: simvastatin (ZOCOR) 20 MG tablet  hydrochlorothiazide (HYDRODIURIL) 25 MG tablet  levothyroxine (SYNTHROID) 50 MCG tablet  amitriptyline (ELAVIL) 25 MG tablet  citalopram (CELEXA) 20 MG tablet   2. Pharmacy (Name, Street, [place]): [id number] [place]  3. On Med List: yes     4. Last Visit with PCP: 04-29-19  5. Next visit date with PCP: 06-16-20   Agent: Please be advised that RX refills may take up to 3 business days. We ask that you follow-up with your pharmacy.

## 2020-06-12 NOTE — Telephone Encounter (Signed)
Medication has been sent to the patient's pharmacy.  

## 2020-06-16 ENCOUNTER — Other Ambulatory Visit: Payer: Self-pay | Admitting: Internal Medicine

## 2020-06-16 ENCOUNTER — Encounter: Payer: Self-pay | Admitting: Internal Medicine

## 2020-06-16 ENCOUNTER — Other Ambulatory Visit: Payer: Self-pay

## 2020-06-16 ENCOUNTER — Ambulatory Visit: Payer: Medicare PPO | Admitting: Internal Medicine

## 2020-06-16 VITALS — BP 140/80 | HR 90 | Temp 98.2°F | Ht 67.5 in | Wt 223.6 lb

## 2020-06-16 DIAGNOSIS — F3342 Major depressive disorder, recurrent, in full remission: Secondary | ICD-10-CM | POA: Diagnosis not present

## 2020-06-16 DIAGNOSIS — I10 Essential (primary) hypertension: Secondary | ICD-10-CM | POA: Diagnosis not present

## 2020-06-16 DIAGNOSIS — M8949 Other hypertrophic osteoarthropathy, multiple sites: Secondary | ICD-10-CM | POA: Diagnosis not present

## 2020-06-16 DIAGNOSIS — M150 Primary generalized (osteo)arthritis: Secondary | ICD-10-CM

## 2020-06-16 DIAGNOSIS — Z0000 Encounter for general adult medical examination without abnormal findings: Secondary | ICD-10-CM | POA: Diagnosis not present

## 2020-06-16 DIAGNOSIS — E039 Hypothyroidism, unspecified: Secondary | ICD-10-CM

## 2020-06-16 DIAGNOSIS — E782 Mixed hyperlipidemia: Secondary | ICD-10-CM | POA: Diagnosis not present

## 2020-06-16 DIAGNOSIS — M159 Polyosteoarthritis, unspecified: Secondary | ICD-10-CM

## 2020-06-16 LAB — LIPID PANEL
Cholesterol: 142 mg/dL (ref 0–200)
HDL: 53.5 mg/dL (ref >39.00)
LDL Cholesterol: 56 mg/dL (ref 0–99)
NonHDL: 88.36
Total CHOL/HDL Ratio: 3
Triglycerides: 160 mg/dL — ABNORMAL HIGH (ref 0.0–149.0)
VLDL: 32 mg/dL (ref 0.0–40.0)

## 2020-06-16 LAB — COMPREHENSIVE METABOLIC PANEL
ALT: 13 U/L (ref 0–35)
AST: 18 U/L (ref 0–37)
Albumin: 4.3 g/dL (ref 3.5–5.2)
Alkaline Phosphatase: 44 U/L (ref 39–117)
BUN: 22 mg/dL (ref 6–23)
CO2: 30 mEq/L (ref 19–32)
Calcium: 9.9 mg/dL (ref 8.4–10.5)
Chloride: 98 mEq/L (ref 96–112)
Creatinine, Ser: 1.45 mg/dL — ABNORMAL HIGH (ref 0.40–1.20)
GFR: 32.36 mL/min — ABNORMAL LOW (ref >60.00)
Glucose, Bld: 97 mg/dL (ref 70–99)
Potassium: 3.4 mEq/L — ABNORMAL LOW (ref 3.5–5.1)
Sodium: 138 mEq/L (ref 135–145)
Total Bilirubin: 1.1 mg/dL (ref 0.2–1.2)
Total Protein: 7.4 g/dL (ref 6.0–8.3)

## 2020-06-16 LAB — CBC
HCT: 39.7 % (ref 36.0–46.0)
Hemoglobin: 13.2 g/dL (ref 12.0–15.0)
MCHC: 33.3 g/dL (ref 30.0–36.0)
MCV: 94.6 fl (ref 78.0–100.0)
Platelets: 245 10*3/uL (ref 150.0–400.0)
RBC: 4.2 Mil/uL (ref 3.87–5.11)
RDW: 13.5 % (ref 11.5–15.5)
WBC: 5.7 10*3/uL (ref 4.0–10.5)

## 2020-06-16 LAB — TSH: TSH: 6.22 u[IU]/mL — ABNORMAL HIGH (ref 0.35–4.50)

## 2020-06-16 MED ORDER — HYDROCHLOROTHIAZIDE 25 MG PO TABS: 25.0000 mg | ORAL_TABLET | Freq: Every day | ORAL | 3 refills | Status: DC

## 2020-06-16 MED ORDER — AMITRIPTYLINE HCL 25 MG PO TABS: 50.0000 mg | ORAL_TABLET | Freq: Every day | ORAL | 3 refills | Status: DC

## 2020-06-16 MED ORDER — CITALOPRAM HYDROBROMIDE 20 MG PO TABS: 20.0000 mg | ORAL_TABLET | Freq: Every day | ORAL | 3 refills | Status: DC

## 2020-06-16 MED ORDER — SIMVASTATIN 20 MG PO TABS: ORAL_TABLET | ORAL | 3 refills | Status: DC

## 2020-06-16 MED ORDER — LEVOTHYROXINE SODIUM 50 MCG PO TABS: 50.0000 ug | ORAL_TABLET | Freq: Every day | ORAL | 3 refills | Status: DC

## 2020-06-16 NOTE — Progress Notes (Signed)
   Subjective:   Patient ID: Daisy Bryant, female    DOB: Mar 10, 1930, 87 y.o.   MRN: [id number]  HPI The patient is an 87 YO female coming in for physical.   PMH, [place], social history reviewed and updated  Review of Systems  Constitutional: Negative.   HENT: Negative.    Eyes: Negative.   Respiratory:  Negative for cough, chest tightness and shortness of breath.   Cardiovascular:  Negative for chest pain, palpitations and leg swelling.  Gastrointestinal:  Negative for abdominal distention, abdominal pain, constipation, diarrhea, nausea and vomiting.  Musculoskeletal:  Positive for arthralgias.  Skin: Negative.   Neurological: Negative.   Psychiatric/Behavioral: Negative.     Objective:  Physical Exam Constitutional:      Appearance: She is well-developed.  HENT:     Head: Normocephalic and atraumatic.  Cardiovascular:     Rate and Rhythm: Normal rate and regular rhythm.  Pulmonary:     Effort: Pulmonary effort is normal. No respiratory distress.     Breath sounds: Normal breath sounds. No wheezing or rales.  Abdominal:     General: Bowel sounds are normal. There is no distension.     Palpations: Abdomen is soft.     Tenderness: There is no abdominal tenderness. There is no rebound.  Musculoskeletal:     Cervical back: Normal range of motion.  Skin:    General: Skin is warm and dry.     Comments: Right foot #2 and 3 toes with slight purple tone with good warmth and cap refill, pulses at PT and DP intact bilaterally.   Neurological:     Mental Status: She is alert and oriented to person, place, and time.     Coordination: Coordination normal.    Vitals:   06/16/20 1448  BP: 140/80  Pulse: 90  Temp: 98.2 F (36.8 C)  TempSrc: Oral  SpO2: 98%  Weight: 223 lb 9.6 oz (101.4 kg)  Height: 5' 7.5" (1.715 m)    This visit occurred during the SARS-CoV-2 public health emergency.  Safety protocols were in place, including screening questions prior to the visit, additional  usage of staff PPE, and extensive cleaning of exam room while observing appropriate contact time as indicated for disinfecting solutions.   Assessment & Plan:

## 2020-06-18 NOTE — Assessment & Plan Note (Signed)
Refill celexa 20 mg daily which is still working well overall. She does not wish to try cessation at this time.

## 2020-06-18 NOTE — Assessment & Plan Note (Signed)
BP at goal on hctz 25 mg daily and checking CMP and adjust as needed.

## 2020-06-18 NOTE — Assessment & Plan Note (Signed)
Uses tylenol for pain.

## 2020-06-18 NOTE — Assessment & Plan Note (Signed)
Checking TSH and adjust synthroid 50 mcg daily.

## 2020-06-18 NOTE — Assessment & Plan Note (Signed)
Flu shot yearly. Covid-19 up to date including boosters. Pneumonia complete. Shingrix counseled to get at pharmacy. Tetanus due 2027. Colonoscopy aged out. Mammogram aged out, pap smear aged out and dexa has had long ago does not want further. Counseled about sun safety and mole surveillance. Counseled about the dangers of distracted driving. Given 10 year screening recommendations.

## 2020-06-18 NOTE — Assessment & Plan Note (Signed)
Checking lipid panel and adjust simvastatin 20 mg daily.  °

## 2020-09-02 DIAGNOSIS — F419 Anxiety disorder, unspecified: Secondary | ICD-10-CM | POA: Diagnosis not present

## 2020-09-02 DIAGNOSIS — E039 Hypothyroidism, unspecified: Secondary | ICD-10-CM | POA: Diagnosis not present

## 2020-09-02 DIAGNOSIS — F325 Major depressive disorder, single episode, in full remission: Secondary | ICD-10-CM | POA: Diagnosis not present

## 2020-09-02 DIAGNOSIS — R32 Unspecified urinary incontinence: Secondary | ICD-10-CM | POA: Diagnosis not present

## 2020-09-02 DIAGNOSIS — R030 Elevated blood-pressure reading, without diagnosis of hypertension: Secondary | ICD-10-CM | POA: Diagnosis not present

## 2020-09-02 DIAGNOSIS — G4700 Insomnia, unspecified: Secondary | ICD-10-CM | POA: Diagnosis not present

## 2020-09-02 DIAGNOSIS — Z6839 Body mass index (BMI) 39.0-39.9, adult: Secondary | ICD-10-CM | POA: Diagnosis not present

## 2020-09-02 DIAGNOSIS — E669 Obesity, unspecified: Secondary | ICD-10-CM | POA: Diagnosis not present

## 2020-09-02 DIAGNOSIS — E785 Hyperlipidemia, unspecified: Secondary | ICD-10-CM | POA: Diagnosis not present

## 2020-09-20 NOTE — Progress Notes (Signed)
Subjective:    Patient ID: Daisy Bryant, female    DOB: 17-Oct-1930, 87 y.o.   MRN: [id number]  This visit occurred during the SARS-CoV-2 public health emergency.  Safety protocols were in place, including screening questions prior to the visit, additional usage of staff PPE, and extensive cleaning of exam room while observing appropriate contact time as indicated for disinfecting solutions.    HPI The patient is here for an acute visit.   Feet swelling - she has L foot swelling since June.  Right foot started swelling. Her left 4-5th toes have been purple  - that has been constant.  There is a purplish hue to them.  No pain in feet.  When she wakes in the morning the swelling is greatly reduced.    She thinks she is sitting more - she uses a stool to keep them up.  No change in medication for years.  She has severe osteoarthritis of her right knee, which is one of the reason she is less active.  She has chronic shortness of breath-no change.  Medications and allergies reviewed with patient and updated if appropriate.  Patient Active Problem List   Diagnosis Date Noted  . Edema of foot 09/21/2020  . Routine general medical examination at a health care facility 01/05/2018  . B12 deficiency 10/06/2015  . ANEMIA, IRON DEFICIENCY 04/22/2008  . PEPTIC STRICTURE 04/17/2008  . CELIAC DISEASE 09/19/2007  . ADENOCARCINOMA, BREAST, RIGHT 03/13/2007  . Hypothyroidism 03/13/2007  . Hyperlipidemia 03/13/2007  . Depression 03/13/2007  . PERIPHERAL NEUROPATHY 03/13/2007  . Essential hypertension 03/13/2007  . GASTROESOPHAGEAL REFLUX DISEASE 03/13/2007  . Osteoarthritis 03/13/2007    Current Outpatient Medications on File Prior to Visit  Medication Sig Dispense Refill  . amitriptyline (ELAVIL) 25 MG tablet Take 2 tablets (50 mg total) by mouth at bedtime. 180 tablet 3  . Cholecalciferol (VITAMIN D3) 1000 units CAPS Take by mouth.    . citalopram (CELEXA) 20 MG tablet Take 1 tablet (20  mg total) by mouth daily. 90 tablet 3  . [facility] VITAMIN B12 1000 MCG tablet TAKE 1 TABLET BY MOUTH EVERY DAY 30 tablet 5  . hydrochlorothiazide (HYDRODIURIL) 25 MG tablet Take 1 tablet (25 mg total) by mouth daily. 90 tablet 3  . levothyroxine (SYNTHROID) 50 MCG tablet Take 1 tablet (50 mcg total) by mouth daily before breakfast. 90 tablet 3  . Probiotic Product (PROBIOTIC DAILY PO) Take by mouth daily.    . simvastatin (ZOCOR) 20 MG tablet TAKE 1 TABLET BY MOUTH EVERY DAY IN THE EVENING 90 tablet 3   No current facility-administered medications on file prior to visit.    Past Medical History:  Diagnosis Date  . Acute gastric ulcer without mention of hemorrhage, perforation, or obstruction 2002  . Anemia   . Anxiety   . Arthritis   . Cancer ([place])   . CAP (community acquired pneumonia) 07/04/2019  . Colon polyp 2010   HYPERPLASTIC POLYP  . Depression   . Diverticulosis of colon (without mention of hemorrhage)   . GERD (gastroesophageal reflux disease)   . H/O hiatal hernia 2002  . Hypertension   . Hypothyroidism   . Neuromuscular disorder ([place])   . Stricture and stenosis of esophagus 2002  . Tuberculosis     Past Surgical History:  Procedure Laterality Date  . BREAST SURGERY    . ENTEROSCOPY  11/09/2011   Procedure: ENTEROSCOPY;  Surgeon: Inda Castle, MD;  Location: [facility];  Service:  Endoscopy;  Laterality: N/A;  . EYE SURGERY    . JOINT REPLACEMENT Left    knee  . MASTECTOMY    . TONSILLECTOMY  under age 89    Social History   Socioeconomic History  . Marital status: Widowed    Spouse name: Not on file  . Number of children: 4  . Years of education: 76  . Highest education level: Not on file  Occupational History  . Occupation: counseling -retired  Tobacco Use  . Smoking status: Never  . Smokeless tobacco: Never  Vaping Use  . Vaping Use: Never used  Substance and Sexual Activity  . Alcohol use: Yes    Alcohol/week: 1.0 standard drink    Types: 1  drink(s) per week    Comment: wine  . Drug use: No  . Sexual activity: Never  Other Topics Concern  . 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 Married '55 - 2 years/divorced, married '65 - 2007/widowed. 2 sons - '55, '69; 2 dtr - '56, '68; 3 grandchildren. LIves alone and is independent all ADLs.  ACP- no CPR,  OK for short term mechanical ventilation, no heroic or futile measure in the face of irreversible disease.    Social Determinants of Health   Financial Resource Strain: Not on file  Food Insecurity: Not on file  Transportation Needs: Not on file  Physical Activity: Not on file  Stress: Not on file  Social Connections: Not on file    Family History  Problem Relation Age of Onset  . Pancreatic cancer Father     Review of Systems  Constitutional:  Negative for chills and fever.  Respiratory:  Positive for shortness of breath (chronic - when she walks a lot - no change in past few months). Negative for cough and wheezing.   Cardiovascular:  Positive for palpitations (with moving around a lot) and leg swelling. Negative for chest pain.  Neurological:  Negative for light-headedness and headaches.      Objective:   Vitals:   09/21/20 1326 09/21/20 1401  BP: (!) 160/80 140/76  Pulse: 99   Temp: 98.2 F (36.8 C)   SpO2: 96%    BP Readings from Last 3 Encounters:  09/21/20 140/76  06/16/20 140/80  06/20/19 124/80   Wt Readings from Last 3 Encounters:  09/21/20 226 lb (102.5 kg)  06/16/20 223 lb 9.6 oz (101.4 kg)  06/20/19 224 lb 9.6 oz (101.9 kg)   Body mass index is 34.87 kg/m.   Physical Exam    Constitutional: Appears well-developed and well-nourished. No distress.  Head: Normocephalic and atraumatic.  Neck: Neck supple. No tracheal deviation present. No thyromegaly present.  No cervical lymphadenopathy Cardiovascular: Normal rate, regular rhythm and normal heart sounds.  2/6  systolic murmur heard. No carotid bruit .  1 + pitting bl/ foot edema.  1+ pedal pulse on left, barely palpable pedal pulse right Pulmonary/Chest: Effort normal and breath sounds normal. No respiratory distress. No has no wheezes. No rales.  Skin: Skin is warm and dry. Not diaphoretic.  Reddish-purplish hue to bilateral toe-left foot worse than right foot.  Toes are cool to touch       Assessment & Plan:    See Problem List for Assessment and Plan of chronic medical problems.

## 2020-09-21 ENCOUNTER — Ambulatory Visit: Payer: Medicare PPO | Admitting: Internal Medicine

## 2020-09-21 ENCOUNTER — Other Ambulatory Visit: Payer: Self-pay

## 2020-09-21 ENCOUNTER — Encounter: Payer: Self-pay | Admitting: Internal Medicine

## 2020-09-21 VITALS — BP 140/76 | HR 99 | Temp 98.2°F | Ht 67.5 in | Wt 226.0 lb

## 2020-09-21 DIAGNOSIS — I10 Essential (primary) hypertension: Secondary | ICD-10-CM

## 2020-09-21 DIAGNOSIS — R600 Localized edema: Secondary | ICD-10-CM | POA: Diagnosis not present

## 2020-09-21 NOTE — Patient Instructions (Addendum)
    Medications changes include :   none    Work on increasing your activity.  Start wearing compression socks.

## 2020-09-21 NOTE — Assessment & Plan Note (Signed)
Subacute She states left foot swelling since June, right foot swelling more recently, but likely this is been going on longer Associated with sensation to toes, bluish-purplish discoloration of toes Has chronic shortness of breath, but no change recently Has chronic kidney disease She is very sedentary, overweight and not exercising which is likely the cause-venous insufficiency Deferred vascular evaluation, deferred echocardiogram Advised increasing exercise, ideally weight loss, elevating feet consistently and wearing compression socks

## 2020-09-21 NOTE — Assessment & Plan Note (Signed)
Chronic Blood pressure initially elevated, but better on repeat Continue hydrochlorothiazide 25 mg daily

## 2020-11-03 ENCOUNTER — Ambulatory Visit: Payer: Medicare PPO

## 2021-01-12 DIAGNOSIS — R32 Unspecified urinary incontinence: Secondary | ICD-10-CM | POA: Diagnosis not present

## 2021-01-12 DIAGNOSIS — F325 Major depressive disorder, single episode, in full remission: Secondary | ICD-10-CM | POA: Diagnosis not present

## 2021-01-12 DIAGNOSIS — Z6839 Body mass index (BMI) 39.0-39.9, adult: Secondary | ICD-10-CM | POA: Diagnosis not present

## 2021-01-12 DIAGNOSIS — E785 Hyperlipidemia, unspecified: Secondary | ICD-10-CM | POA: Diagnosis not present

## 2021-01-12 DIAGNOSIS — I10 Essential (primary) hypertension: Secondary | ICD-10-CM | POA: Diagnosis not present

## 2021-01-12 DIAGNOSIS — M1990 Unspecified osteoarthritis, unspecified site: Secondary | ICD-10-CM | POA: Diagnosis not present

## 2021-01-12 DIAGNOSIS — E039 Hypothyroidism, unspecified: Secondary | ICD-10-CM | POA: Diagnosis not present

## 2021-01-12 DIAGNOSIS — F419 Anxiety disorder, unspecified: Secondary | ICD-10-CM | POA: Diagnosis not present

## 2021-02-26 DIAGNOSIS — H52203 Unspecified astigmatism, bilateral: Secondary | ICD-10-CM | POA: Diagnosis not present

## 2021-02-26 DIAGNOSIS — H43813 Vitreous degeneration, bilateral: Secondary | ICD-10-CM | POA: Diagnosis not present

## 2021-07-19 ENCOUNTER — Other Ambulatory Visit: Payer: Self-pay | Admitting: Internal Medicine

## 2021-07-26 ENCOUNTER — Ambulatory Visit: Payer: Medicare PPO | Admitting: Internal Medicine

## 2021-07-26 ENCOUNTER — Encounter: Payer: Self-pay | Admitting: Internal Medicine

## 2021-07-26 VITALS — BP 138/80 | HR 103 | Temp 98.2°F | Ht 67.5 in | Wt 224.0 lb

## 2021-07-26 DIAGNOSIS — E782 Mixed hyperlipidemia: Secondary | ICD-10-CM | POA: Diagnosis not present

## 2021-07-26 DIAGNOSIS — F3342 Major depressive disorder, recurrent, in full remission: Secondary | ICD-10-CM | POA: Diagnosis not present

## 2021-07-26 DIAGNOSIS — Z0000 Encounter for general adult medical examination without abnormal findings: Secondary | ICD-10-CM | POA: Diagnosis not present

## 2021-07-26 DIAGNOSIS — E039 Hypothyroidism, unspecified: Secondary | ICD-10-CM | POA: Diagnosis not present

## 2021-07-26 DIAGNOSIS — D509 Iron deficiency anemia, unspecified: Secondary | ICD-10-CM

## 2021-07-26 DIAGNOSIS — M1711 Unilateral primary osteoarthritis, right knee: Secondary | ICD-10-CM | POA: Diagnosis not present

## 2021-07-26 DIAGNOSIS — I10 Essential (primary) hypertension: Secondary | ICD-10-CM

## 2021-07-26 LAB — COMPREHENSIVE METABOLIC PANEL
ALT: 17 U/L (ref 0–35)
AST: 21 U/L (ref 0–37)
Albumin: 4.2 g/dL (ref 3.5–5.2)
Alkaline Phosphatase: 47 U/L (ref 39–117)
BUN: 31 mg/dL — ABNORMAL HIGH (ref 6–23)
CO2: 30 mEq/L (ref 19–32)
Calcium: 10.2 mg/dL (ref 8.4–10.5)
Chloride: 101 mEq/L (ref 96–112)
Creatinine, Ser: 1.31 mg/dL — ABNORMAL HIGH (ref 0.40–1.20)
GFR: 36.27 mL/min — ABNORMAL LOW (ref >60.00)
Glucose, Bld: 101 mg/dL — ABNORMAL HIGH (ref 70–99)
Potassium: 4.2 mEq/L (ref 3.5–5.1)
Sodium: 139 mEq/L (ref 135–145)
Total Bilirubin: 0.8 mg/dL (ref 0.2–1.2)
Total Protein: 7.2 g/dL (ref 6.0–8.3)

## 2021-07-26 LAB — CBC
HCT: 40.4 % (ref 36.0–46.0)
Hemoglobin: 13.4 g/dL (ref 12.0–15.0)
MCHC: 33.1 g/dL (ref 30.0–36.0)
MCV: 95.4 fl (ref 78.0–100.0)
Platelets: 243 10*3/uL (ref 150.0–400.0)
RBC: 4.23 Mil/uL (ref 3.87–5.11)
RDW: 13.2 % (ref 11.5–15.5)
WBC: 7.2 10*3/uL (ref 4.0–10.5)

## 2021-07-26 LAB — LIPID PANEL
Cholesterol: 156 mg/dL (ref 0–200)
HDL: 55.9 mg/dL (ref >39.00)
LDL Cholesterol: 62 mg/dL (ref 0–99)
NonHDL: 99.84
Total CHOL/HDL Ratio: 3
Triglycerides: 188 mg/dL — ABNORMAL HIGH (ref 0.0–149.0)
VLDL: 37.6 mg/dL (ref 0.0–40.0)

## 2021-07-26 LAB — TSH: TSH: 6.25 u[IU]/mL — ABNORMAL HIGH (ref 0.35–5.50)

## 2021-07-26 MED ORDER — AMITRIPTYLINE HCL 25 MG PO TABS: 50.0000 mg | ORAL_TABLET | Freq: Every day | ORAL | 3 refills | Status: DC

## 2021-07-26 MED ORDER — HYDROCHLOROTHIAZIDE 25 MG PO TABS: 25.0000 mg | ORAL_TABLET | Freq: Every day | ORAL | 3 refills | Status: DC

## 2021-07-26 MED ORDER — CITALOPRAM HYDROBROMIDE 20 MG PO TABS: 20.0000 mg | ORAL_TABLET | Freq: Every day | ORAL | 3 refills | Status: DC

## 2021-07-26 MED ORDER — LEVOTHYROXINE SODIUM 50 MCG PO TABS: 50.0000 ug | ORAL_TABLET | Freq: Every day | ORAL | 3 refills | Status: DC

## 2021-07-26 MED ORDER — SIMVASTATIN 20 MG PO TABS
ORAL_TABLET | ORAL | 3 refills | Status: DC
Start: 2021-07-26 — End: 2022-07-12

## 2021-07-26 NOTE — Patient Instructions (Signed)
We will check the labs.   

## 2021-07-26 NOTE — Progress Notes (Signed)
Subjective:   Patient ID: Daisy Bryant, female    DOB: 1930/10/02, 88 y.o.   MRN: [id number]  HPI Here for medicare wellness, no new complaints. Please see A/P for status and treatment of chronic medical problems.   Diet: heart healthy  Physical activity: sedentary Depression/mood screen: negative Hearing: intact to whispered voice Visual acuity: grossly normal with lens, performs annual eye exam  ADLs: capable Fall risk: low Home safety: good Cognitive evaluation: intact to orientation, naming, recall and repetition EOL planning: adv directives discussed  [place] Visit from 07/26/2021 in [place] at [facility]  PHQ-2 Total Score [place] Office Visit from 07/26/2021 in [place] at [facility]  PHQ-9 Total Score 0         06/12/2017    2:36 PM 06/14/2018    2:19 PM 06/20/2019    1:31 PM 06/16/2020    2:52 PM 07/26/2021    1:56 PM  [place] in the past year? No 0 0 1 1  Was there an injury with Fall?   0 0 0  Fall Risk Category Calculator   0 1 1  Fall Risk Category   Low Low Low  Patient Fall Risk Level   Low fall risk Low fall risk Moderate fall risk  Patient at Risk for Falls Due to   No Fall Risks No Fall Risks History of fall(s);Impaired balance/gait  Fall risk Follow up   Falls evaluation completed;Education provided Falls evaluation completed Falls evaluation completed    I have personally reviewed and have noted 1. The patient's medical and social history - reviewed today no changes 2. Their use of alcohol, tobacco or illicit drugs 3. Their current medications and supplements 4. The patient's functional ability including ADL's, fall risks, home safety risks and hearing or visual impairment. 5. Diet and physical activities 6. Evidence for depression or mood disorders 7. Care team reviewed and updated 8.  The patient is {} on an opioid pain medication and this was reviewed with patient and non-opioid pain  medication options were reviewed and offered to patient, their pain treatment plan and severity was discussed with them. We have considered referrals as appropriate for patient. Opioid risk factors were also considered and reviewed.   Patient Care Team: Hoyt Koch, MD as PCP - General (Internal Medicine) Past Medical History:  Diagnosis Date  . Acute gastric ulcer without mention of hemorrhage, perforation, or obstruction 2002  . Anemia   . Anxiety   . Arthritis   . Cancer ([place])   . CAP (community acquired pneumonia) 07/04/2019  . Colon polyp 2010   HYPERPLASTIC POLYP  . Depression   . Diverticulosis of colon (without mention of hemorrhage)   . GERD (gastroesophageal reflux disease)   . H/O hiatal hernia 2002  . Hypertension   . Hypothyroidism   . Neuromuscular disorder ([place])   . Stricture and stenosis of esophagus 2002  . Tuberculosis    Past Surgical History:  Procedure Laterality Date  . BREAST SURGERY    . ENTEROSCOPY  11/09/2011   Procedure: ENTEROSCOPY;  Surgeon: Inda Castle, MD;  Location: [facility];  Service: Endoscopy;  Laterality: N/A;  . EYE SURGERY    . JOINT REPLACEMENT Left    knee  . MASTECTOMY    . TONSILLECTOMY  under age 25   Family History  Problem Relation Age of Onset  . Pancreatic cancer Father  Review of Systems  Objective:  Physical Exam  Vitals:   07/26/21 1350  BP: 138/80  Pulse: (!) 103  Temp: 98.2 F (36.8 C)  TempSrc: Oral  SpO2: 93%  Weight: 224 lb (101.6 kg)  Height: 5' 7.5" (1.715 m)    Assessment & Plan:

## 2021-07-28 NOTE — Progress Notes (Unsigned)
I, Peterson Lombard, LAT, ATC acting as a scribe for Lynne Leader, MD.  Subjective:    CC: R knee pain  HPI: Pt is an 88 y/o female c/o R knee pain ongoing for year, but intermittently worsening for several months. Pt locates pain to inside the R knee joint. Pt notes her gait has changed to toe out, which has caused her gait to sway. Pt's daughter is concerned about pt falling.  R knee swelling: no Mechanical symptoms: no Aggravates: transitioning to stand, walking, sometimes at night, stairs Treatments tried: none  Pertinent review of Systems: No fevers or chills  Relevant historical information: History of left total knee replacement.   Objective:    Vitals:   07/29/21 1330  BP: 138/78  Pulse: 81  SpO2: 95%   General: Well Developed, well nourished, and in no acute distress.   MSK: Right knee: Moderate effusion otherwise normal-appearing Range of motion 5-100 degrees. Tender palpation medial joint line. Stable ligamentous exam. Intact strength.  Lab and Radiology Results  Procedure: Real-time Ultrasound Guided Injection of right knee superior lateral patellar space Device: Philips Affiniti 50G Images permanently stored and available for review in PACS Verbal informed consent obtained.  Discussed risks and benefits of procedure. Warned about infection, bleeding, hyperglycemia damage to structures among others. Patient expresses understanding and agreement Time-out conducted.   Noted no overlying erythema, induration, or other signs of local infection.   Skin prepped in a sterile fashion.   Local anesthesia: Topical Ethyl chloride.   With sterile technique and under real time ultrasound guidance: 40 mg of Kenalog and 2 mL of Marcaine injected into knee joint. Fluid seen entering the joint capsule.   Completed without difficulty   Pain immediately resolved suggesting accurate placement of the medication.   Advised to call if fevers/chills, erythema, induration,  drainage, or persistent bleeding.   Images permanently stored and available for review in the ultrasound unit.  Impression: Technically successful ultrasound guided injection.   X-ray images right knee obtained today personally and independently interpreted. Severe medial compartment DJD.  Mild to moderate lateral patellofemoral DJD.  No acute fracture is present.  Await formal radiology review   Impression and Recommendations:    Assessment and Plan: 88 y.o. female with right knee pain due to exacerbation of DJD.  This is associated with a abnormal gait.  Gait thought to be due to inability to fully extend the knee as well as the degenerative changes of pain.  Plan for steroid injection today and referral to physical therapy.  She would like to try to avoid knee replacement which is reasonable.  Recheck in 6 weeks.Marland Kitchen  PDMP not reviewed this encounter. Orders Placed This Encounter  Procedures   DG Knee AP/LAT W/Sunrise Right    Standing Status:   Future    Number of Occurrences:   1    Standing Expiration Date:   08/29/2021    Order Specific Question:   Reason for Exam (SYMPTOM  OR DIAGNOSIS REQUIRED)    Answer:   right knee pain    Order Specific Question:   Preferred imaging location?    Answer:   [facility]   [place] LIMITED JOINT SPACE STRUCTURES LOW RIGHT(NO LINKED CHARGES)    Order Specific Question:   Reason for Exam (SYMPTOM  OR DIAGNOSIS REQUIRED)    Answer:   right knee pain    Order Specific Question:   Preferred imaging location?    Answer:   [place]  Ambulatory referral to Physical Therapy    Referral Priority:   Routine    Referral Type:   Physical Medicine    Referral Reason:   Specialty Services Required    Requested Specialty:   Physical Therapy    Number of Visits Requested:   1   No orders of the defined types were placed in this encounter.   Discussed warning signs or symptoms. Please see discharge instructions. Patient  expresses understanding.   The above documentation has been reviewed and is accurate and complete Lynne Leader, M.D.

## 2021-07-29 ENCOUNTER — Ambulatory Visit: Payer: Self-pay

## 2021-07-29 ENCOUNTER — Ambulatory Visit: Payer: Medicare PPO | Admitting: Family Medicine

## 2021-07-29 ENCOUNTER — Ambulatory Visit (INDEPENDENT_AMBULATORY_CARE_PROVIDER_SITE_OTHER): Payer: Medicare PPO

## 2021-07-29 VITALS — BP 138/78 | HR 81 | Ht 67.5 in | Wt 226.8 lb

## 2021-07-29 DIAGNOSIS — M25561 Pain in right knee: Secondary | ICD-10-CM

## 2021-07-29 DIAGNOSIS — G8929 Other chronic pain: Secondary | ICD-10-CM

## 2021-07-29 DIAGNOSIS — R269 Unspecified abnormalities of gait and mobility: Secondary | ICD-10-CM

## 2021-07-29 NOTE — Assessment & Plan Note (Signed)
Flu shot yearly. Covid-19 counseled. Pneumonia complete. Shingrix 1st done 2nd due August 2023. Tetanus due 2027. Colonoscopy aged out. Mammogram aged out, pap smear aged out and dexa complete. Counseled about sun safety and mole surveillance. Counseled about the dangers of distracted driving. Given 10 year screening recommendations.

## 2021-07-29 NOTE — Assessment & Plan Note (Signed)
Taking celexa 20 mg daily and controlled. She is having a lot of stress right now with son (pancreas cancer). Advised if she needs change or counseling let us know.

## 2021-07-29 NOTE — Assessment & Plan Note (Signed)
Checking TSH and adjust synthroid 50 mcg daily as needed. No symptoms.

## 2021-07-29 NOTE — Assessment & Plan Note (Signed)
Checking CBC for monitoring.

## 2021-07-29 NOTE — Assessment & Plan Note (Signed)
BP at goal on hctz 25 mg daily and checking CBC and CMP and adjust as needed.

## 2021-07-29 NOTE — Assessment & Plan Note (Signed)
Checking lipid panel and adjust simvastatin 20 mg daily as needed.  

## 2021-07-29 NOTE — Patient Instructions (Addendum)
Thank you for coming in today.   I've referred you to Physical Therapy.  Let us know if you don't hear from them in one week.   You received an injection today. Seek immediate medical attention if the joint becomes red, extremely painful, or is oozing fluid.   Please use Voltaren gel (Generic Diclofenac Gel) up to 4x daily for pain as needed.  This is available over-the-counter as both the name brand Voltaren gel and the generic diclofenac gel.   Check back in 6 weeks

## 2021-07-29 NOTE — Assessment & Plan Note (Signed)
Referral to sports medicine for knee and to try injection. She is not wanting to do replacement which was recommended some time ago by orthopedics.

## 2021-08-02 ENCOUNTER — Other Ambulatory Visit: Payer: Self-pay

## 2021-08-02 ENCOUNTER — Encounter: Payer: Self-pay | Admitting: Rehabilitative and Restorative Service Providers"

## 2021-08-02 ENCOUNTER — Ambulatory Visit: Payer: Medicare PPO | Attending: Family Medicine | Admitting: Rehabilitative and Restorative Service Providers"

## 2021-08-02 DIAGNOSIS — R269 Unspecified abnormalities of gait and mobility: Secondary | ICD-10-CM | POA: Diagnosis not present

## 2021-08-02 DIAGNOSIS — M25561 Pain in right knee: Secondary | ICD-10-CM | POA: Insufficient documentation

## 2021-08-02 DIAGNOSIS — R2689 Other abnormalities of gait and mobility: Secondary | ICD-10-CM

## 2021-08-02 DIAGNOSIS — G8929 Other chronic pain: Secondary | ICD-10-CM

## 2021-08-02 DIAGNOSIS — M6281 Muscle weakness (generalized): Secondary | ICD-10-CM | POA: Diagnosis not present

## 2021-08-02 NOTE — Therapy (Signed)
OUTPATIENT PHYSICAL THERAPY LOWER EXTREMITY EVALUATION   Patient Name: Daisy Bryant MRN: [id number] DOB:Jan 13, 1930, 88 y.o., female Today's Date: 08/02/2021   PT End of Session - 08/02/21 1456     Visit Number 1    Authorization Type [employer]    Progress Note Due on Visit 10    PT Start Time 1445    PT Stop Time 1525    PT Time Calculation (min) 40 min    Activity Tolerance Patient tolerated treatment well    Behavior During Therapy [facility] for tasks assessed/performed             Past Medical History:  Diagnosis Date   Acute gastric ulcer without mention of hemorrhage, perforation, or obstruction 2002   Anemia    Anxiety    Arthritis    Cancer ([place])    CAP (community acquired pneumonia) 07/04/2019   Colon polyp 2010   HYPERPLASTIC POLYP   Depression    Diverticulosis of colon (without mention of hemorrhage)    GERD (gastroesophageal reflux disease)    H/O hiatal hernia 2002   Hypertension    Hypothyroidism    Neuromuscular disorder ([place])    Stricture and stenosis of esophagus 2002   Tuberculosis    Past Surgical History:  Procedure Laterality Date   BREAST SURGERY     ENTEROSCOPY  11/09/2011   Procedure: ENTEROSCOPY;  Surgeon: Inda Castle, MD;  Location: [facility];  Service: Endoscopy;  Laterality: N/A;   EYE SURGERY     JOINT REPLACEMENT Left    knee   MASTECTOMY     TONSILLECTOMY  under age 30   Patient Active Problem List   Diagnosis Date Noted   Edema of foot 09/21/2020   Routine general medical examination at a health care facility 01/05/2018   B12 deficiency 10/06/2015   ANEMIA, IRON DEFICIENCY 04/22/2008   PEPTIC STRICTURE 04/17/2008   CELIAC DISEASE 09/19/2007   ADENOCARCINOMA, BREAST, RIGHT 03/13/2007   Hypothyroidism 03/13/2007   Hyperlipidemia 03/13/2007   Depression 03/13/2007   PERIPHERAL NEUROPATHY 03/13/2007   Essential hypertension 03/13/2007   GASTROESOPHAGEAL REFLUX DISEASE 03/13/2007   Osteoarthritis 03/13/2007     PCP: Hoyt Koch, MD  REFERRING PROVIDER: Gregor Hams, MD  REFERRING DIAG: [phone] (ICD-10-CM) - Chronic pain of right knee R26.9 (ICD-10-CM) - Abnormality of gait   THERAPY DIAG:  Chronic pain of right knee - Plan: PT plan of care cert/re-cert  Muscle weakness (generalized) - Plan: PT plan of care cert/re-cert  Other abnormalities of gait and mobility - Plan: PT plan of care cert/re-cert  Rationale for Evaluation and Treatment Rehabilitation  ONSET DATE: Overall for a year with last couple of months getting worse  SUBJECTIVE:   SUBJECTIVE STATEMENT: Pt reports that she has been having right knee pain intermittently for about a year, but it has progressively gotten worse over the past couple of months.  Pt received an injection by Dr Georgina Snell and states that it has helped some, but she continues to have pain.  PERTINENT HISTORY: OA, L knee TKA in 1991, Anemia, Breast Cancer, Tuberculosis exposure at 88 years old  PAIN:  Are you having pain? Yes: NPRS scale: 7/10 Pain location: right knee Pain description: dull hurt Aggravating factors: standing Relieving factors: taking pressure off her knee  PRECAUTIONS: Fall  WEIGHT BEARING RESTRICTIONS No  FALLS:  Has patient fallen in last 6 months?  Pt denies falls, but states that she has "caught herself" about 3 times  LIVING ENVIRONMENT:  Lives with:  lives alone with her Durenda Hurt, Alison Murray Lives in: House/apartment Stairs: Yes: External: 2 steps; can reach both Has following equipment at home: Single point cane, shower chair, and Grab bars  OCCUPATION: retired  PLOF: Independent and Leisure: Barista, [place], playing on computer  PATIENT GOALS:  To avoid having right knee surgery and to decrease her pain.   OBJECTIVE:   DIAGNOSTIC FINDINGS:  Right knee radiograph on 07/29/21: IMPRESSION: 1. Severe medial and patellofemoral compartment osteoarthritis. 2. Small joint effusion.  PATIENT SURVEYS:   FOTO 43% (projected 55% by visit 12)  COGNITION:  Overall cognitive status: Within functional limits for tasks assessed     SENSATION: WFL  MUSCLE LENGTH: Right hamstring tightness with pain  POSTURE: rounded shoulders  PALPATION: Tender to palpation along right knee  LOWER EXTREMITY ROM:  Active ROM Right eval Left eval  Hip flexion    Hip extension    Hip abduction    Hip adduction    Hip internal rotation    Hip external rotation    Knee flexion 120 with pain 120  Knee extension 15 0  Ankle dorsiflexion    Ankle plantarflexion    Ankle inversion    Ankle eversion     (Blank rows = not tested)  LOWER EXTREMITY MMT:  R hip/knee strength of 4/5, L hip/knee strength 5/5  LOWER EXTREMITY SPECIAL TESTS:  Knee special tests: Anterior drawer test: negative and Posterior drawer test: negative  FUNCTIONAL TESTS:  5 times sit to stand: 18.84 Timed up and go (TUG): 15.9 sec  GAIT: Distance walked: 50 ft Assistive device utilized: None Level of assistance: Modified independence Comments: Antalgic gait with decreased weight bearing through RLE.    TODAY'S TREATMENT: 08/02/2021: Seated:  heel/toe raises, LAQ, marching.  BLE x10 reps   PATIENT EDUCATION:  Education details: Issued HEP Person educated: Patient Education method: Explanation, Demonstration, and Handouts Education comprehension: verbalized understanding and returned demonstration   HOME EXERCISE PROGRAM: Access Code: [email or web address] URL: [email or web address] Date: 08/02/2021 Prepared by: Juel Burrow  Exercises - Seated Heel Toe Raises  - 2 x daily - 7 x weekly - 2 sets - 10 reps - Seated March  - 2 x daily - 7 x weekly - 2 sets - 10 reps - Seated Long Arc Quad  - 2 x daily - 7 x weekly - 2 sets - 10 reps - Seated Hip Adduction Isometrics with Ball  - 2 x daily - 7 x weekly - 2 sets - 10 reps  ASSESSMENT:  CLINICAL IMPRESSION: Patient is an 88 y.o. female who was seen today  for physical therapy evaluation and treatment for right knee pain. Pts PLOF is living independently and able to care for her dog without assistance.  Pt with antalgic gait noted and with reports of multiple near falls, where she had to catch herself to prevent a complete fall.  Pt presents with R knee pain, muscle weakness, difficulty walking, right knee stiffness, and difficulty performing typical ADLs and IADLs without increased difficulty/strain.  Pt would benefit from skilled PT to address her functional impairments to allow her to be safer and more independent and avoid right knee surgery.   OBJECTIVE IMPAIRMENTS decreased balance, difficulty walking, decreased ROM, decreased strength, impaired flexibility, and pain.   ACTIVITY LIMITATIONS standing, squatting, and stairs  PARTICIPATION LIMITATIONS: laundry, community activity, and yard work  PERSONAL FACTORS Age, Time since onset of injury/illness/exacerbation, and 3+ comorbidities: OA, Hx of breast cancer, Hx of L TKA, HTN  are also affecting patient's functional outcome.   REHAB POTENTIAL: Good  CLINICAL DECISION MAKING: Evolving/moderate complexity  EVALUATION COMPLEXITY: Moderate   GOALS: Goals reviewed with patient? Yes  SHORT TERM GOALS: Target date: 08/23/2021  Pt will be independent with initial HEP. Baseline: Goal status: INITIAL  2.  Pt will report at least a 40% improvement in symptoms since initial evaluation. Baseline:  Goal status: INITIAL   LONG TERM GOALS: Target date: 09/27/2021   Pt will be independent with advanced HEP. Baseline:  Goal status: INITIAL  2.  Pt will increase FOTO to at least 55% to demonstrate improvements in functional mobility. Baseline: 43% Goal status: INITIAL  3.  Pt will increase right hip/knee strength to at least 4+/5 to allow for easier navigation of steps. Baseline: 4/5 Goal status: INITIAL  4.  Pt will report being able to stand/ambulate at least 20 minutes without increased  pain to allow her to complete functional and community activities. Baseline: pain 7/10 with standing Goal status: INITIAL  5.  Pt will increase right knee extension to 0 degrees to allow for improved gait pattern. Baseline:  missing 15 degrees from full extension Goal status:  INITIAL   PLAN: PT FREQUENCY: 2x/week  PT DURATION: 8 weeks  PLANNED INTERVENTIONS: Therapeutic exercises, Therapeutic activity, Neuromuscular re-education, Balance training, Gait training, Patient/Family education, Self Care, Joint mobilization, Joint manipulation, Stair training, DME instructions, Aquatic Therapy, Dry Needling, Electrical stimulation, Cryotherapy, Moist heat, Taping, Ultrasound, Ionotophoresis '4mg'$ /ml Dexamethasone, Manual therapy, and Re-evaluation  PLAN FOR NEXT SESSION: progress and assess HEP as indicated, strengthening, flexibility   Juel Burrow, PT 08/02/2021, 4:41 PM   [facility] [address], [place],  [address] Phone # [phone] Fax [phone]

## 2021-08-02 NOTE — Progress Notes (Signed)
Right knee x-ray shows severe arthritis

## 2021-08-03 ENCOUNTER — Ambulatory Visit: Payer: Medicare PPO | Admitting: Physical Therapy

## 2021-08-10 ENCOUNTER — Ambulatory Visit: Payer: Medicare PPO | Attending: Family Medicine | Admitting: Rehabilitative and Restorative Service Providers"

## 2021-08-10 ENCOUNTER — Encounter: Payer: Self-pay | Admitting: Rehabilitative and Restorative Service Providers"

## 2021-08-10 DIAGNOSIS — M25561 Pain in right knee: Secondary | ICD-10-CM | POA: Insufficient documentation

## 2021-08-10 DIAGNOSIS — R2689 Other abnormalities of gait and mobility: Secondary | ICD-10-CM | POA: Diagnosis not present

## 2021-08-10 DIAGNOSIS — G8929 Other chronic pain: Secondary | ICD-10-CM | POA: Insufficient documentation

## 2021-08-10 DIAGNOSIS — M6281 Muscle weakness (generalized): Secondary | ICD-10-CM | POA: Insufficient documentation

## 2021-08-10 NOTE — Therapy (Signed)
OUTPATIENT PHYSICAL THERAPY TREATMENT NOTE   Patient Name: Daisy Bryant MRN: [id number] DOB:April 12, 1930, 88 y.o., female Today's Date: 08/10/2021   PT End of Session - 08/10/21 1405     Visit Number 2    Date for PT Re-Evaluation 09/27/21    Authorization Type [employer]    Authorization Time Period [employer] Approved 16 visits 08/02/2021-09/27/2021- [id number]    Authorization - Visit Number 2    Authorization - Number of Visits 16    Progress Note Due on Visit 10    PT Start Time 1401    PT Stop Time 1440    PT Time Calculation (min) 39 min    Activity Tolerance Patient tolerated treatment well    Behavior During Therapy [facility] for tasks assessed/performed             Past Medical History:  Diagnosis Date   Acute gastric ulcer without mention of hemorrhage, perforation, or obstruction 2002   Anemia    Anxiety    Arthritis    Cancer ([place])    CAP (community acquired pneumonia) 07/04/2019   Colon polyp 2010   HYPERPLASTIC POLYP   Depression    Diverticulosis of colon (without mention of hemorrhage)    GERD (gastroesophageal reflux disease)    H/O hiatal hernia 2002   Hypertension    Hypothyroidism    Neuromuscular disorder ([place])    Stricture and stenosis of esophagus 2002   Tuberculosis    Past Surgical History:  Procedure Laterality Date   BREAST SURGERY     ENTEROSCOPY  11/09/2011   Procedure: ENTEROSCOPY;  Surgeon: Inda Castle, MD;  Location: [facility];  Service: Endoscopy;  Laterality: N/A;   EYE SURGERY     JOINT REPLACEMENT Left    knee   MASTECTOMY     TONSILLECTOMY  under age 52   Patient Active Problem List   Diagnosis Date Noted   Edema of foot 09/21/2020   Routine general medical examination at a health care facility 01/05/2018   B12 deficiency 10/06/2015   ANEMIA, IRON DEFICIENCY 04/22/2008   PEPTIC STRICTURE 04/17/2008   CELIAC DISEASE 09/19/2007   ADENOCARCINOMA, BREAST, RIGHT 03/13/2007   Hypothyroidism 03/13/2007    Hyperlipidemia 03/13/2007   Depression 03/13/2007   PERIPHERAL NEUROPATHY 03/13/2007   Essential hypertension 03/13/2007   GASTROESOPHAGEAL REFLUX DISEASE 03/13/2007   Osteoarthritis 03/13/2007    PCP: Hoyt Koch, MD  REFERRING PROVIDER: Gregor Hams, MD  REFERRING DIAG: [phone] (ICD-10-CM) - Chronic pain of right knee R26.9 (ICD-10-CM) - Abnormality of gait   THERAPY DIAG:  Chronic pain of right knee  Muscle weakness (generalized)  Other abnormalities of gait and mobility  Rationale for Evaluation and Treatment Rehabilitation  ONSET DATE: Overall for a year with last couple of months getting worse  SUBJECTIVE:   SUBJECTIVE STATEMENT: Pt reports that she has not been doing her HEP consistently.  "I have too much going on at my house to do too much of anything, hopefully next week will be better"  PERTINENT HISTORY: OA, L knee TKA in 1991, Anemia, Breast Cancer, Tuberculosis exposure at 88 years old  PAIN:  Are you having pain? Yes: NPRS scale: 8/10 Pain location: right knee Pain description: dull hurt Aggravating factors: standing Relieving factors: taking pressure off her knee  PRECAUTIONS: Fall  WEIGHT BEARING RESTRICTIONS No  FALLS:  Has patient fallen in last 6 months?  Pt denies falls, but states that she has "caught herself" about 3 times  LIVING ENVIRONMENT:  Lives with:  lives alone with her Durenda Hurt, Alison [place] Lives in: House/apartment Stairs: Yes: External: 2 steps; can reach both Has following equipment at home: Single point cane, shower chair, and Grab bars  OCCUPATION: retired  PLOF: Independent and Leisure: Barista, [place], playing on computer  PATIENT GOALS:  To avoid having right knee surgery and to decrease her pain.   OBJECTIVE:   DIAGNOSTIC FINDINGS:  Right knee radiograph on 07/29/21: IMPRESSION: 1. Severe medial and patellofemoral compartment osteoarthritis. 2. Small joint effusion.  PATIENT SURVEYS:  FOTO 43%  (projected 55% by visit 12)  COGNITION:  Overall cognitive status: Within functional limits for tasks assessed     SENSATION: WFL  MUSCLE LENGTH: Right hamstring tightness with pain  POSTURE: rounded shoulders  PALPATION: Tender to palpation along right knee  LOWER EXTREMITY ROM:  Active ROM Right eval Left eval  Hip flexion    Hip extension    Hip abduction    Hip adduction    Hip internal rotation    Hip external rotation    Knee flexion 120 with pain 120  Knee extension 15 0  Ankle dorsiflexion    Ankle plantarflexion    Ankle inversion    Ankle eversion     (Blank rows = not tested)  LOWER EXTREMITY MMT:  R hip/knee strength of 4/5, L hip/knee strength 5/5  LOWER EXTREMITY SPECIAL TESTS:  Knee special tests: Anterior drawer test: negative and Posterior drawer test: negative  FUNCTIONAL TESTS:  5 times sit to stand: 18.84 Timed up and go (TUG): 15.9 sec  GAIT: Distance walked: 50 ft Assistive device utilized: None Level of assistance: Modified independence Comments: Antalgic gait with decreased weight bearing through RLE.    TODAY'S TREATMENT: 08/10/2021: Nustep level 3 x6 min with PT present to discuss status Seated:  heel/toe raises, LAQ, marching, hip abduction scissors, hip adduction ball squeeze.  BLE 2x10 reps Sit to/from stand 2x5 reps Supine:  SLR and heel slides with foot on slider.  BLE 2x10 each Sidelying clamshells 2x10 bilat Seated hamstring stretch 3x20 sec bilat. Standing calf stretch on 2" step 2x20 sec  08/02/2021: Seated:  heel/toe raises, LAQ, marching.  BLE x10 reps   PATIENT EDUCATION:  Education details: Issued HEP Person educated: Patient Education method: Explanation, Demonstration, and Handouts Education comprehension: verbalized understanding and returned demonstration   HOME EXERCISE PROGRAM: Access Code: [email or web address] URL: [email or web address] Date: 08/02/2021 Prepared by: Juel Burrow  Exercises - Seated Heel Toe Raises  - 2 x daily - 7 x weekly - 2 sets - 10 reps - Seated March  - 2 x daily - 7 x weekly - 2 sets - 10 reps - Seated Long Arc Quad  - 2 x daily - 7 x weekly - 2 sets - 10 reps - Seated Hip Adduction Isometrics with Ball  - 2 x daily - 7 x weekly - 2 sets - 10 reps  ASSESSMENT:  CLINICAL IMPRESSION: Ms Hebel presents to skilled rehabilitation for her first visit following initial evaluation.  Pt has not been as compliant with initial evaluation, so did not progress HEP at this time.  Pt requires frequent recovery periods throughout session, but able to complete all exercises.  Pt requires min cuing throughout for technique.  Pt continues to require skilled PT to progress towards goal related activities.   OBJECTIVE IMPAIRMENTS decreased balance, difficulty walking, decreased ROM, decreased strength, impaired flexibility, and pain.   ACTIVITY LIMITATIONS standing, squatting, and stairs  PARTICIPATION LIMITATIONS: laundry, community activity, and  yard work  PERSONAL FACTORS Age, Time since onset of injury/illness/exacerbation, and 3+ comorbidities: OA, Hx of breast cancer, Hx of L TKA, HTN  are also affecting patient's functional outcome.   REHAB POTENTIAL: Good  CLINICAL DECISION MAKING: Evolving/moderate complexity  EVALUATION COMPLEXITY: Moderate   GOALS: Goals reviewed with patient? Yes  SHORT TERM GOALS: Target date: 08/23/2021  Pt will be independent with initial HEP. Baseline: Goal status: INITIAL  2.  Pt will report at least a 40% improvement in symptoms since initial evaluation. Baseline:  Goal status: INITIAL   LONG TERM GOALS: Target date: 09/27/2021   Pt will be independent with advanced HEP. Baseline:  Goal status: INITIAL  2.  Pt will increase FOTO to at least 55% to demonstrate improvements in functional mobility. Baseline: 43% Goal status: INITIAL  3.  Pt will increase right hip/knee strength to at least 4+/5 to  allow for easier navigation of steps. Baseline: 4/5 Goal status: INITIAL  4.  Pt will report being able to stand/ambulate at least 20 minutes without increased pain to allow her to complete functional and community activities. Baseline: pain 7/10 with standing Goal status: INITIAL  5.  Pt will increase right knee extension to 0 degrees to allow for improved gait pattern. Baseline:  missing 15 degrees from full extension Goal status:  INITIAL   PLAN: PT FREQUENCY: 2x/week  PT DURATION: 8 weeks  PLANNED INTERVENTIONS: Therapeutic exercises, Therapeutic activity, Neuromuscular re-education, Balance training, Gait training, Patient/Family education, Self Care, Joint mobilization, Joint manipulation, Stair training, DME instructions, Aquatic Therapy, Dry Needling, Electrical stimulation, Cryotherapy, Moist heat, Taping, Ultrasound, Ionotophoresis '4mg'$ /ml Dexamethasone, Manual therapy, and Re-evaluation  PLAN FOR NEXT SESSION: progress and assess HEP as indicated, strengthening, flexibility   Juel Burrow, PT 08/10/2021, 2:47 PM   [facility] [address] [place], [place] [address] Phone # [phone] Fax [phone]

## 2021-08-17 ENCOUNTER — Encounter: Payer: Self-pay | Admitting: Physical Therapy

## 2021-08-17 ENCOUNTER — Ambulatory Visit: Payer: Medicare PPO | Admitting: Physical Therapy

## 2021-08-17 DIAGNOSIS — G8929 Other chronic pain: Secondary | ICD-10-CM | POA: Diagnosis not present

## 2021-08-17 DIAGNOSIS — R2689 Other abnormalities of gait and mobility: Secondary | ICD-10-CM | POA: Diagnosis not present

## 2021-08-17 DIAGNOSIS — M25561 Pain in right knee: Secondary | ICD-10-CM | POA: Diagnosis not present

## 2021-08-17 DIAGNOSIS — M6281 Muscle weakness (generalized): Secondary | ICD-10-CM | POA: Diagnosis not present

## 2021-08-17 NOTE — Therapy (Signed)
OUTPATIENT PHYSICAL THERAPY TREATMENT NOTE   Patient Name: Daisy Bryant MRN: [id number] DOB:07-06-1930, 88 y.o., female Today's Date: 08/17/2021    END OF SESSION:   PT End of Session - 08/17/21 1525     Visit Number 3    Date for PT Re-Evaluation 09/27/21    Authorization Type [employer]    Authorization Time Period [employer] Approved 16 visits 08/02/2021-09/27/2021- [phone]    Authorization - Visit Number 3    Authorization - Number of Visits 16    Progress Note Due on Visit 10    PT Start Time 1450    PT Stop Time 1530    PT Time Calculation (min) 40 min    Activity Tolerance Patient tolerated treatment well;No increased pain    Behavior During Therapy [facility] for tasks assessed/performed             Past Medical History:  Diagnosis Date   Acute gastric ulcer without mention of hemorrhage, perforation, or obstruction 2002   Anemia    Anxiety    Arthritis    Cancer ([place])    CAP (community acquired pneumonia) 07/04/2019   Colon polyp 2010   HYPERPLASTIC POLYP   Depression    Diverticulosis of colon (without mention of hemorrhage)    GERD (gastroesophageal reflux disease)    H/O hiatal hernia 2002   Hypertension    Hypothyroidism    Neuromuscular disorder ([place])    Stricture and stenosis of esophagus 2002   Tuberculosis    Past Surgical History:  Procedure Laterality Date   BREAST SURGERY     ENTEROSCOPY  11/09/2011   Procedure: ENTEROSCOPY;  Surgeon: Inda Castle, MD;  Location: [facility];  Service: Endoscopy;  Laterality: N/A;   EYE SURGERY     JOINT REPLACEMENT Left    knee   MASTECTOMY     TONSILLECTOMY  under age 32   Patient Active Problem List   Diagnosis Date Noted   Edema of foot 09/21/2020   Routine general medical examination at a health care facility 01/05/2018   B12 deficiency 10/06/2015   ANEMIA, IRON DEFICIENCY 04/22/2008   PEPTIC STRICTURE 04/17/2008   CELIAC DISEASE 09/19/2007   ADENOCARCINOMA, BREAST, RIGHT 03/13/2007    Hypothyroidism 03/13/2007   Hyperlipidemia 03/13/2007   Depression 03/13/2007   PERIPHERAL NEUROPATHY 03/13/2007   Essential hypertension 03/13/2007   GASTROESOPHAGEAL REFLUX DISEASE 03/13/2007   Osteoarthritis 03/13/2007    PCP: Hoyt Koch, MD   REFERRING PROVIDER: Gregor Hams, MD   REFERRING DIAG: [phone] (ICD-10-CM) - Chronic pain of right knee R26.9 (ICD-10-CM) - Abnormality of gait    THERAPY DIAG:  Chronic pain of right knee   Muscle weakness (generalized)   Other abnormalities of gait and mobility   Rationale for Evaluation and Treatment Rehabilitation   ONSET DATE: Overall for a year with last couple of months getting worse   SUBJECTIVE:    SUBJECTIVE STATEMENT: Pt states that she stood too long in the waiting room and her knee is screaming at her. Currently a 8 or 9/10. She has not been able to get to her HEP because of everything going on at home.    PERTINENT HISTORY: OA, L knee TKA in 1991, Anemia, Breast Cancer, Tuberculosis exposure at 88 years old   PAIN:  Are you having pain? Yes: NPRS scale: 8 or 9/10 Pain location: right knee Pain description: dull hurt Aggravating factors: standing Relieving factors: taking pressure off her knee   PRECAUTIONS: Fall  WEIGHT BEARING RESTRICTIONS No   FALLS:  Has patient fallen in last 6 months?  Pt denies falls, but states that she has "caught herself" about 3 times   LIVING ENVIRONMENT: Lives with:  lives alone with her [place], [place] Lives in: House/apartment Stairs: Yes: External: 2 steps; can reach both Has following equipment at home: Single point cane, shower chair, and Grab bars   OCCUPATION: retired   PLOF: Independent and Leisure: Barista, [place], playing on computer   PATIENT GOALS:  To avoid having right knee surgery and to decrease her pain.     OBJECTIVE:    DIAGNOSTIC FINDINGS:  Right knee radiograph on 07/29/21: IMPRESSION: 1. Severe medial and patellofemoral  compartment osteoarthritis. 2. Small joint effusion.   PATIENT SURVEYS:  FOTO 43% (projected 55% by visit 12)   COGNITION:           Overall cognitive status: Within functional limits for tasks assessed                          SENSATION: WFL   MUSCLE LENGTH: Right hamstring tightness with pain   POSTURE: rounded shoulders   PALPATION: Tender to palpation along right knee   LOWER EXTREMITY ROM:   Active ROM Right eval Left eval  Hip flexion      Hip extension      Hip abduction      Hip adduction      Hip internal rotation      Hip external rotation      Knee flexion 120 with pain 120  Knee extension 15 0  Ankle dorsiflexion      Ankle plantarflexion      Ankle inversion      Ankle eversion       (Blank rows = not tested)   LOWER EXTREMITY MMT:   R hip/knee strength of 4/5, L hip/knee strength 5/5   LOWER EXTREMITY SPECIAL TESTS:  Knee special tests: Anterior drawer test: negative and Posterior drawer test: negative   FUNCTIONAL TESTS:  5 times sit to stand: 18.84 Timed up and go (TUG): 15.9 sec   GAIT: Distance walked: 50 ft Assistive device utilized: None Level of assistance: Modified independence Comments: Antalgic gait with decreased weight bearing through RLE.       TODAY'S TREATMENT:   08/17/2021: Seated Rt hamstring curl red TB x10 reps  Seated PF with green TB x10 reps, Rt LE extended on table for knee stretch Seated hip adductor ball squeeze 10x5 sec hold Sit to stand 2x10 reps- mat table elevated  Standing Rt hip adduction slide with yellow TB x10 reps   Manual treatment:  MWM Rt knee extension (Open chain)  MWM Rt knee sit to stand 2x10 reps- pain/pressure was improved per pt report     08/10/2021: Nustep level 3 x6 min with PT present to discuss status Seated:  heel/toe raises, LAQ, marching, hip abduction scissors, hip adduction ball squeeze.  BLE 2x10 reps Sit to/from stand 2x5 reps Supine:  SLR and heel slides with foot on  slider.  BLE 2x10 each Sidelying clamshells 2x10 bilat Seated hamstring stretch 3x20 sec bilat. Standing calf stretch on 2" step 2x20 sec        PATIENT EDUCATION:  Education details: Issued HEP Person educated: Patient Education method: Explanation, Demonstration, and Handouts Education comprehension: verbalized understanding and returned demonstration     HOME EXERCISE PROGRAM: Access Code: [email or web address] URL: [email or web address] Date: 08/02/2021 Prepared by: Juel Burrow  Exercises - Seated Heel Toe Raises  - 2 x daily - 7 x weekly - 2 sets - 10 reps - Seated March  - 2 x daily - 7 x weekly - 2 sets - 10 reps - Seated Long Arc Quad  - 2 x daily - 7 x weekly - 2 sets - 10 reps - Seated Hip Adduction Isometrics with Ball  - 2 x daily - 7 x weekly - 2 sets - 10 reps   ASSESSMENT:   CLINICAL IMPRESSION: Pt is still having difficulty with HEP adherence. Pain was high upon arrival due to standing a lot in the lobby prior to her session. Focused on therex to increase LE strength. Pt was able to complete all exercises without much difficulty. She responded well to manual treatment of mobilization with movement to the Rt knee, noting decrease in her knee pain to 5 to 6/10 at the end of the session. PT encouraged better adherence to her HEP.      OBJECTIVE IMPAIRMENTS decreased balance, difficulty walking, decreased ROM, decreased strength, impaired flexibility, and pain.    ACTIVITY LIMITATIONS standing, squatting, and stairs   PARTICIPATION LIMITATIONS: laundry, community activity, and yard work   PERSONAL FACTORS Age, Time since onset of injury/illness/exacerbation, and 3+ comorbidities: OA, Hx of breast cancer, Hx of L TKA, HTN  are also affecting patient's functional outcome.    REHAB POTENTIAL: Good   CLINICAL DECISION MAKING: Evolving/moderate complexity   EVALUATION COMPLEXITY: Moderate     GOALS: Goals reviewed with patient? Yes   SHORT TERM GOALS:  Target date: 08/23/2021  Pt will be independent with initial HEP. Baseline: Goal status: INITIAL   2.  Pt will report at least a 40% improvement in symptoms since initial evaluation. Baseline:  Goal status: INITIAL     LONG TERM GOALS: Target date: 09/27/2021    Pt will be independent with advanced HEP. Baseline:  Goal status: INITIAL   2.  Pt will increase FOTO to at least 55% to demonstrate improvements in functional mobility. Baseline: 43% Goal status: INITIAL   3.  Pt will increase right hip/knee strength to at least 4+/5 to allow for easier navigation of steps. Baseline: 4/5 Goal status: INITIAL   4.  Pt will report being able to stand/ambulate at least 20 minutes without increased pain to allow her to complete functional and community activities. Baseline: pain 7/10 with standing Goal status: INITIAL   5.  Pt will increase right knee extension to 0 degrees to allow for improved gait pattern. Baseline:  missing 15 degrees from full extension Goal status:  INITIAL     PLAN: PT FREQUENCY: 2x/week   PT DURATION: 8 weeks   PLANNED INTERVENTIONS: Therapeutic exercises, Therapeutic activity, Neuromuscular re-education, Balance training, Gait training, Patient/Family education, Self Care, Joint mobilization, Joint manipulation, Stair training, DME instructions, Aquatic Therapy, Dry Needling, Electrical stimulation, Cryotherapy, Moist heat, Taping, Ultrasound, Ionotophoresis '4mg'$ /ml Dexamethasone, Manual therapy, and Re-evaluation   PLAN FOR NEXT SESSION: progress and assess HEP as indicated, Pt did well with closed chain mobilization with movement during sit to stand, strengthening, flexibility      3:38 PM,08/17/21 Sherol Dade PT, DPT [place] [facility] [place]

## 2021-08-20 ENCOUNTER — Ambulatory Visit: Payer: Medicare PPO

## 2021-08-20 DIAGNOSIS — R2689 Other abnormalities of gait and mobility: Secondary | ICD-10-CM | POA: Diagnosis not present

## 2021-08-20 DIAGNOSIS — M6281 Muscle weakness (generalized): Secondary | ICD-10-CM | POA: Diagnosis not present

## 2021-08-20 DIAGNOSIS — G8929 Other chronic pain: Secondary | ICD-10-CM | POA: Diagnosis not present

## 2021-08-20 DIAGNOSIS — M25561 Pain in right knee: Secondary | ICD-10-CM | POA: Diagnosis not present

## 2021-08-20 NOTE — Therapy (Signed)
OUTPATIENT PHYSICAL THERAPY TREATMENT NOTE   Patient Name: Daisy Bryant MRN: [id number] DOB:1930/04/15, 88 y.o., female Today's Date: 08/20/2021    END OF SESSION:   PT End of Session - 08/20/21 1017     Visit Number 4    Number of Visits 16    Date for PT Re-Evaluation 09/27/21    Authorization Type [employer]    Authorization Time Period [employer] Approved 16 visits 08/02/2021-09/27/2021- [id number]    Authorization - Visit Number 4    Authorization - Number of Visits 16    Progress Note Due on Visit 10    PT Start Time 1017    PT Stop Time 1100    PT Time Calculation (min) 43 min    Activity Tolerance Patient tolerated treatment well;No increased pain    Behavior During Therapy [facility] for tasks assessed/performed             Past Medical History:  Diagnosis Date   Acute gastric ulcer without mention of hemorrhage, perforation, or obstruction 2002   Anemia    Anxiety    Arthritis    Cancer ([place])    CAP (community acquired pneumonia) 07/04/2019   Colon polyp 2010   HYPERPLASTIC POLYP   Depression    Diverticulosis of colon (without mention of hemorrhage)    GERD (gastroesophageal reflux disease)    H/O hiatal hernia 2002   Hypertension    Hypothyroidism    Neuromuscular disorder ([place])    Stricture and stenosis of esophagus 2002   Tuberculosis    Past Surgical History:  Procedure Laterality Date   BREAST SURGERY     ENTEROSCOPY  11/09/2011   Procedure: ENTEROSCOPY;  Surgeon: Inda Castle, MD;  Location: [facility];  Service: Endoscopy;  Laterality: N/A;   EYE SURGERY     JOINT REPLACEMENT Left    knee   MASTECTOMY     TONSILLECTOMY  under age 12   Patient Active Problem List   Diagnosis Date Noted   Edema of foot 09/21/2020   Routine general medical examination at a health care facility 01/05/2018   B12 deficiency 10/06/2015   ANEMIA, IRON DEFICIENCY 04/22/2008   PEPTIC STRICTURE 04/17/2008   CELIAC DISEASE 09/19/2007   ADENOCARCINOMA,  BREAST, RIGHT 03/13/2007   Hypothyroidism 03/13/2007   Hyperlipidemia 03/13/2007   Depression 03/13/2007   PERIPHERAL NEUROPATHY 03/13/2007   Essential hypertension 03/13/2007   GASTROESOPHAGEAL REFLUX DISEASE 03/13/2007   Osteoarthritis 03/13/2007    PCP: Hoyt Koch, MD   REFERRING PROVIDER: Gregor Hams, MD   REFERRING DIAG: [phone] (ICD-10-CM) - Chronic pain of right knee R26.9 (ICD-10-CM) - Abnormality of gait    THERAPY DIAG:  Chronic pain of right knee   Muscle weakness (generalized)   Other abnormalities of gait and mobility   Rationale for Evaluation and Treatment Rehabilitation   ONSET DATE: Overall for a year with last couple of months getting worse   SUBJECTIVE:    SUBJECTIVE STATEMENT: Pt states that she is doing ok today.  Pain is fairly controlled.      PERTINENT HISTORY: OA, L knee TKA in 1991, Anemia, Breast Cancer, Tuberculosis exposure at 88 years old   PAIN:  Are you having pain? Yes: NPRS scale: 8 or 9/10 Pain location: right knee Pain description: dull hurt Aggravating factors: standing Relieving factors: taking pressure off her knee   PRECAUTIONS: Fall   WEIGHT BEARING RESTRICTIONS No   FALLS:  Has patient fallen in last 6 months?  Pt denies  falls, but states that she has "caught herself" about 3 times   LIVING ENVIRONMENT: Lives with:  lives alone with her [place], [place] Lives in: House/apartment Stairs: Yes: External: 2 steps; can reach both Has following equipment at home: Single point cane, shower chair, and Grab bars   OCCUPATION: retired   PLOF: Independent and Leisure: Barista, [place], playing on computer   PATIENT GOALS:  To avoid having right knee surgery and to decrease her pain.     OBJECTIVE:    DIAGNOSTIC FINDINGS:  Right knee radiograph on 07/29/21: IMPRESSION: 1. Severe medial and patellofemoral compartment osteoarthritis. 2. Small joint effusion.   PATIENT SURVEYS:  FOTO 43% (projected 55%  by visit 12)   COGNITION:           Overall cognitive status: Within functional limits for tasks assessed                          SENSATION: WFL   MUSCLE LENGTH: Right hamstring tightness with pain   POSTURE: rounded shoulders   PALPATION: Tender to palpation along right knee   LOWER EXTREMITY ROM:   Active ROM Right eval Left eval  Hip flexion      Hip extension      Hip abduction      Hip adduction      Hip internal rotation      Hip external rotation      Knee flexion 120 with pain 120  Knee extension 15 0  Ankle dorsiflexion      Ankle plantarflexion      Ankle inversion      Ankle eversion       (Blank rows = not tested)   LOWER EXTREMITY MMT:   R hip/knee strength of 4/5, L hip/knee strength 5/5   LOWER EXTREMITY SPECIAL TESTS:  Knee special tests: Anterior drawer test: negative and Posterior drawer test: negative   FUNCTIONAL TESTS:  5 times sit to stand: 18.84 Timed up and go (TUG): 15.9 sec   GAIT: Distance walked: 50 ft Assistive device utilized: None Level of assistance: Modified independence Comments: Antalgic gait with decreased weight bearing through RLE.       TODAY'S TREATMENT:  08/20/2021: NuStep x 5 min level 3 (PT present to discuss progress and goals) Seated heel and toe raises x 20 Seated LAQ 2 x 10 with 1 lb both Seated march x 20 with 1 lb both Seated hip ER 2 x 10  with 1 lb both Seated hamstring curl with red band 2 x 10 Supine quad set with slider under heel 2 x 10 right only Supine heel slide 2 x 10 right only Supine hip abduction 2 x 10 with slider under heel both Supine SLR 2 x 5  0 lb both  08/17/2021: Seated Rt hamstring curl red TB x10 reps  Seated PF with green TB x10 reps, Rt LE extended on table for knee stretch Seated hip adductor ball squeeze 10x5 sec hold Sit to stand 2x10 reps- mat table elevated  Standing Rt hip adduction slide with yellow TB x10 reps   Manual treatment:  MWM Rt knee extension (Open  chain)  MWM Rt knee sit to stand 2x10 reps- pain/pressure was improved per pt report     08/10/2021: Nustep level 3 x6 min with PT present to discuss status Seated:  heel/toe raises, LAQ, marching, hip abduction scissors, hip adduction ball squeeze.  BLE 2x10 reps Sit to/from stand 2x5 reps Supine:  SLR and heel slides with foot on slider.  BLE 2x10 each Sidelying clamshells 2x10 bilat Seated hamstring stretch 3x20 sec bilat. Standing calf stretch on 2" step 2x20 sec        PATIENT EDUCATION:  Education details: Issued HEP Person educated: Patient Education method: Explanation, Demonstration, and Handouts Education comprehension: verbalized understanding and returned demonstration     HOME EXERCISE PROGRAM: Access Code: [email or web address] URL: [email or web address] Date: 08/02/2021 Prepared by: Juel Burrow   Exercises - Seated Heel Toe Raises  - 2 x daily - 7 x weekly - 2 sets - 10 reps - Seated March  - 2 x daily - 7 x weekly - 2 sets - 10 reps - Seated Long Arc Quad  - 2 x daily - 7 x weekly - 2 sets - 10 reps - Seated Hip Adduction Isometrics with Ball  - 2 x daily - 7 x weekly - 2 sets - 10 reps   ASSESSMENT:   CLINICAL IMPRESSION: Kendahl was able to tolerate increased activity today with only min knee pain with supine hip abduction due to IT band tightness.  She would benefit from continued skilled PT for bilateral LE strengthening and flexibility exercises as well as ROM to right knee.        OBJECTIVE IMPAIRMENTS decreased balance, difficulty walking, decreased ROM, decreased strength, impaired flexibility, and pain.    ACTIVITY LIMITATIONS standing, squatting, and stairs   PARTICIPATION LIMITATIONS: laundry, community activity, and yard work   PERSONAL FACTORS Age, Time since onset of injury/illness/exacerbation, and 3+ comorbidities: OA, Hx of breast cancer, Hx of L TKA, HTN  are also affecting patient's functional outcome.    REHAB POTENTIAL: Good    CLINICAL DECISION MAKING: Evolving/moderate complexity   EVALUATION COMPLEXITY: Moderate     GOALS: Goals reviewed with patient? Yes   SHORT TERM GOALS: Target date: 08/23/2021  Pt will be independent with initial HEP. Baseline: Goal status: INITIAL   2.  Pt will report at least a 40% improvement in symptoms since initial evaluation. Baseline:  Goal status: INITIAL     LONG TERM GOALS: Target date: 09/27/2021    Pt will be independent with advanced HEP. Baseline:  Goal status: INITIAL   2.  Pt will increase FOTO to at least 55% to demonstrate improvements in functional mobility. Baseline: 43% Goal status: INITIAL   3.  Pt will increase right hip/knee strength to at least 4+/5 to allow for easier navigation of steps. Baseline: 4/5 Goal status: INITIAL   4.  Pt will report being able to stand/ambulate at least 20 minutes without increased pain to allow her to complete functional and community activities. Baseline: pain 7/10 with standing Goal status: INITIAL   5.  Pt will increase right knee extension to 0 degrees to allow for improved gait pattern. Baseline:  missing 15 degrees from full extension Goal status:  INITIAL     PLAN: PT FREQUENCY: 2x/week   PT DURATION: 8 weeks   PLANNED INTERVENTIONS: Therapeutic exercises, Therapeutic activity, Neuromuscular re-education, Balance training, Gait training, Patient/Family education, Self Care, Joint mobilization, Joint manipulation, Stair training, DME instructions, Aquatic Therapy, Dry Needling, Electrical stimulation, Cryotherapy, Moist heat, Taping, Ultrasound, Ionotophoresis '4mg'$ /ml Dexamethasone, Manual therapy, and Re-evaluation   PLAN FOR NEXT SESSION: progress and assess HEP as indicated, Pt did well with closed chain mobilization with movement during sit to stand, strengthening, flexibility      Randi Poullard B. Joas Motton, PT 08/20/21 11:10 AM  [facility] [address]  [place], [place] [address] Phone # [phone] Fax [phone]

## 2021-08-24 ENCOUNTER — Ambulatory Visit: Payer: Medicare PPO

## 2021-08-24 DIAGNOSIS — G8929 Other chronic pain: Secondary | ICD-10-CM

## 2021-08-24 DIAGNOSIS — M6281 Muscle weakness (generalized): Secondary | ICD-10-CM | POA: Diagnosis not present

## 2021-08-24 DIAGNOSIS — R2689 Other abnormalities of gait and mobility: Secondary | ICD-10-CM

## 2021-08-24 DIAGNOSIS — M25561 Pain in right knee: Secondary | ICD-10-CM | POA: Diagnosis not present

## 2021-08-24 NOTE — Therapy (Signed)
OUTPATIENT PHYSICAL THERAPY TREATMENT NOTE   Patient Name: Daisy Bryant MRN: [id number] DOB:May 31, 1930, 88 y.o., female Today's Date: 08/24/2021    END OF SESSION:   PT End of Session - 08/24/21 1240     Visit Number 5    Number of Visits 16    Date for PT Re-Evaluation 09/27/21    Authorization Type [employer]    Authorization Time Period [employer] Approved 16 visits 08/02/2021-09/27/2021- [phone]    Authorization - Visit Number 5    Authorization - Number of Visits 16    Progress Note Due on Visit 10    PT Start Time 1230    PT Stop Time 1310    PT Time Calculation (min) 40 min    Activity Tolerance Patient tolerated treatment well;No increased pain;Patient limited by fatigue    Behavior During Therapy [facility] for tasks assessed/performed             Past Medical History:  Diagnosis Date   Acute gastric ulcer without mention of hemorrhage, perforation, or obstruction 2002   Anemia    Anxiety    Arthritis    Cancer ([place])    CAP (community acquired pneumonia) 07/04/2019   Colon polyp 2010   HYPERPLASTIC POLYP   Depression    Diverticulosis of colon (without mention of hemorrhage)    GERD (gastroesophageal reflux disease)    H/O hiatal hernia 2002   Hypertension    Hypothyroidism    Neuromuscular disorder ([place])    Stricture and stenosis of esophagus 2002   Tuberculosis    Past Surgical History:  Procedure Laterality Date   BREAST SURGERY     ENTEROSCOPY  11/09/2011   Procedure: ENTEROSCOPY;  Surgeon: Inda Castle, MD;  Location: [facility];  Service: Endoscopy;  Laterality: N/A;   EYE SURGERY     JOINT REPLACEMENT Left    knee   MASTECTOMY     TONSILLECTOMY  under age 66   Patient Active Problem List   Diagnosis Date Noted   Edema of foot 09/21/2020   Routine general medical examination at a health care facility 01/05/2018   B12 deficiency 10/06/2015   ANEMIA, IRON DEFICIENCY 04/22/2008   PEPTIC STRICTURE 04/17/2008   CELIAC DISEASE  09/19/2007   ADENOCARCINOMA, BREAST, RIGHT 03/13/2007   Hypothyroidism 03/13/2007   Hyperlipidemia 03/13/2007   Depression 03/13/2007   PERIPHERAL NEUROPATHY 03/13/2007   Essential hypertension 03/13/2007   GASTROESOPHAGEAL REFLUX DISEASE 03/13/2007   Osteoarthritis 03/13/2007    PCP: Hoyt Koch, MD   REFERRING PROVIDER: Gregor Hams, MD   REFERRING DIAG: [phone] (ICD-10-CM) - Chronic pain of right knee R26.9 (ICD-10-CM) - Abnormality of gait    THERAPY DIAG:  Chronic pain of right knee   Muscle weakness (generalized)   Other abnormalities of gait and mobility   Rationale for Evaluation and Treatment Rehabilitation   ONSET DATE: Overall for a year with last couple of months getting worse   SUBJECTIVE:    SUBJECTIVE STATEMENT: Pt states that she is having more pain today.  "I had a lot of company this weekend and I am worn out" Pain level elevated.      PERTINENT HISTORY: OA, L knee TKA in 1991, Anemia, Breast Cancer, Tuberculosis exposure at 88 years old   PAIN:  Are you having pain? Yes: NPRS scale: 7-8/10 Pain location: right knee Pain description: dull hurt Aggravating factors: standing Relieving factors: taking pressure off her knee   PRECAUTIONS: Fall   WEIGHT BEARING RESTRICTIONS No  FALLS:  Has patient fallen in last 6 months?  Pt denies falls, but states that she has "caught herself" about 3 times   LIVING ENVIRONMENT: Lives with:  lives alone with her [place], [place] Lives in: House/apartment Stairs: Yes: External: 2 steps; can reach both Has following equipment at home: Single point cane, shower chair, and Grab bars   OCCUPATION: retired   PLOF: Independent and Leisure: Barista, [place], playing on computer   PATIENT GOALS:  To avoid having right knee surgery and to decrease her pain.     OBJECTIVE:    DIAGNOSTIC FINDINGS:  Right knee radiograph on 07/29/21: IMPRESSION: 1. Severe medial and patellofemoral compartment  osteoarthritis. 2. Small joint effusion.   PATIENT SURVEYS:  FOTO 43% (projected 55% by visit 12)   COGNITION:           Overall cognitive status: Within functional limits for tasks assessed                          SENSATION: WFL   MUSCLE LENGTH: Right hamstring tightness with pain   POSTURE: rounded shoulders   PALPATION: Tender to palpation along right knee   LOWER EXTREMITY ROM:   Active ROM Right eval Left eval  Hip flexion      Hip extension      Hip abduction      Hip adduction      Hip internal rotation      Hip external rotation      Knee flexion 120 with pain 120  Knee extension 15 0  Ankle dorsiflexion      Ankle plantarflexion      Ankle inversion      Ankle eversion       (Blank rows = not tested)   LOWER EXTREMITY MMT:   R hip/knee strength of 4/5, L hip/knee strength 5/5   LOWER EXTREMITY SPECIAL TESTS:  Knee special tests: Anterior drawer test: negative and Posterior drawer test: negative   FUNCTIONAL TESTS:  5 times sit to stand: 18.84 Timed up and go (TUG): 15.9 sec   GAIT: Distance walked: 50 ft Assistive device utilized: None Level of assistance: Modified independence Comments: Antalgic gait with decreased weight bearing through RLE.       TODAY'S TREATMENT:  08/24/2021: NuStep x 8 min level 3 (PT present to discuss progress and goals) Seated heel and toe raises x 20 Seated LAQ 2 x 10 with 2 lb both Seated march x 20 with 2 lb both Seated hip ER 2 x 10  with 1 lb both Seated hamstring curl with red band 2 x 10 Supine quad set with slider under heel 2 x 10 both Supine heel slide 2 x 10 right Supine hip abduction 2 x 10 with slider under heel both Supine SLR 2 x 5  0 lb both  08/20/2021: NuStep x 5 min level 3 (PT present to discuss progress and goals) Seated heel and toe raises x 20 Seated LAQ 2 x 10 with 1 lb both Seated march x 20 with 1 lb both Seated hip ER 2 x 10  with 1 lb both Seated hamstring curl with red band 2 x  10 Supine quad set with slider under heel 2 x 10 right only Supine heel slide 2 x 10 right only Supine hip abduction 2 x 10 with slider under heel both Supine SLR  x 10  2 lb both  08/17/2021: Seated Rt hamstring curl red TB x10  reps  Seated PF with green TB x10 reps, Rt LE extended on table for knee stretch Seated hip adductor ball squeeze 10x5 sec hold Sit to stand 2x10 reps- mat table elevated  Standing Rt hip adduction slide with yellow TB x10 reps   Manual treatment:  MWM Rt knee extension (Open chain)  MWM Rt knee sit to stand 2x10 reps- pain/pressure was improved per pt report     PATIENT EDUCATION:  Education details: Issued HEP Person educated: Patient Education method: Explanation, Demonstration, and Handouts Education comprehension: verbalized understanding and returned demonstration     HOME EXERCISE PROGRAM: Access Code: [email or web address] URL: [email or web address] Date: 08/02/2021 Prepared by: Juel Burrow   Exercises - Seated Heel Toe Raises  - 2 x daily - 7 x weekly - 2 sets - 10 reps - Seated March  - 2 x daily - 7 x weekly - 2 sets - 10 reps - Seated Long Arc Quad  - 2 x daily - 7 x weekly - 2 sets - 10 reps - Seated Hip Adduction Isometrics with Ball  - 2 x daily - 7 x weekly - 2 sets - 10 reps   ASSESSMENT:   CLINICAL IMPRESSION: Daisy Bryant was able to tolerate today's exercises with no significant pain. She demonstrated improved quad activation at end range today during quad sets.  She was able to do 10 SLR vs. 5 with less heel lag.   She would benefit from continued skilled PT for bilateral LE strengthening and flexibility exercises as well as ROM to right knee.        OBJECTIVE IMPAIRMENTS decreased balance, difficulty walking, decreased ROM, decreased strength, impaired flexibility, and pain.    ACTIVITY LIMITATIONS standing, squatting, and stairs   PARTICIPATION LIMITATIONS: laundry, community activity, and yard work   PERSONAL FACTORS Age,  Time since onset of injury/illness/exacerbation, and 3+ comorbidities: OA, Hx of breast cancer, Hx of L TKA, HTN  are also affecting patient's functional outcome.    REHAB POTENTIAL: Good   CLINICAL DECISION MAKING: Evolving/moderate complexity   EVALUATION COMPLEXITY: Moderate     GOALS: Goals reviewed with patient? Yes   SHORT TERM GOALS: Target date: 08/23/2021  Pt will be independent with initial HEP. Baseline: Goal status: MET   2.  Pt will report at least a 40% improvement in symptoms since initial evaluation. Baseline:  Goal status: Progressing     LONG TERM GOALS: Target date: 09/27/2021    Pt will be independent with advanced HEP. Baseline:  Goal status: INITIAL   2.  Pt will increase FOTO to at least 55% to demonstrate improvements in functional mobility. Baseline: 43% Goal status: INITIAL   3.  Pt will increase right hip/knee strength to at least 4+/5 to allow for easier navigation of steps. Baseline: 4/5 Goal status: INITIAL   4.  Pt will report being able to stand/ambulate at least 20 minutes without increased pain to allow her to complete functional and community activities. Baseline: pain 7/10 with standing Goal status: INITIAL   5.  Pt will increase right knee extension to 0 degrees to allow for improved gait pattern. Baseline:  missing 15 degrees from full extension Goal status:  INITIAL     PLAN: PT FREQUENCY: 2x/week   PT DURATION: 8 weeks   PLANNED INTERVENTIONS: Therapeutic exercises, Therapeutic activity, Neuromuscular re-education, Balance training, Gait training, Patient/Family education, Self Care, Joint mobilization, Joint manipulation, Stair training, DME instructions, Aquatic Therapy, Dry Needling, Electrical stimulation, Cryotherapy, Moist heat, Taping, Ultrasound, Ionotophoresis 54m/ml Dexamethasone,  Manual therapy, and Re-evaluation   PLAN FOR NEXT SESSION: progress and assess HEP as indicated, Pt did well with closed chain mobilization  with movement during sit to stand, strengthening, flexibility     Ausar Georgiou B. Icy Fuhrmann, PT 08/24/21 1:17 PM  [facility] [address] [place], [place] [address] Phone # [phone] Fax [phone]

## 2021-08-26 ENCOUNTER — Ambulatory Visit: Payer: Medicare PPO

## 2021-08-26 DIAGNOSIS — M6281 Muscle weakness (generalized): Secondary | ICD-10-CM | POA: Diagnosis not present

## 2021-08-26 DIAGNOSIS — R2689 Other abnormalities of gait and mobility: Secondary | ICD-10-CM

## 2021-08-26 DIAGNOSIS — G8929 Other chronic pain: Secondary | ICD-10-CM

## 2021-08-26 DIAGNOSIS — M25561 Pain in right knee: Secondary | ICD-10-CM | POA: Diagnosis not present

## 2021-08-26 NOTE — Therapy (Signed)
OUTPATIENT PHYSICAL THERAPY TREATMENT NOTE   Patient Name: Daisy Bryant MRN: [id number] DOB:January 17, 1930, 88 y.o., female Today's Date: 08/26/2021    END OF SESSION:   PT End of Session - 08/26/21 1523     Visit Number 6    Number of Visits 16    Date for PT Re-Evaluation 09/27/21    Authorization Type [employer]    Authorization Time Period [employer] Approved 16 visits 08/02/2021-09/27/2021- [phone]    Authorization - Visit Number 6    Authorization - Number of Visits 16    Progress Note Due on Visit 10    PT Start Time 1400    PT Stop Time 1440    PT Time Calculation (min) 40 min              Past Medical History:  Diagnosis Date   Acute gastric ulcer without mention of hemorrhage, perforation, or obstruction 2002   Anemia    Anxiety    Arthritis    Cancer ([place])    CAP (community acquired pneumonia) 07/04/2019   Colon polyp 2010   HYPERPLASTIC POLYP   Depression    Diverticulosis of colon (without mention of hemorrhage)    GERD (gastroesophageal reflux disease)    H/O hiatal hernia 2002   Hypertension    Hypothyroidism    Neuromuscular disorder ([place])    Stricture and stenosis of esophagus 2002   Tuberculosis    Past Surgical History:  Procedure Laterality Date   BREAST SURGERY     ENTEROSCOPY  11/09/2011   Procedure: ENTEROSCOPY;  Surgeon: Inda Castle, MD;  Location: [facility];  Service: Endoscopy;  Laterality: N/A;   EYE SURGERY     JOINT REPLACEMENT Left    knee   MASTECTOMY     TONSILLECTOMY  under age 67   Patient Active Problem List   Diagnosis Date Noted   Edema of foot 09/21/2020   Routine general medical examination at a health care facility 01/05/2018   B12 deficiency 10/06/2015   ANEMIA, IRON DEFICIENCY 04/22/2008   PEPTIC STRICTURE 04/17/2008   CELIAC DISEASE 09/19/2007   ADENOCARCINOMA, BREAST, RIGHT 03/13/2007   Hypothyroidism 03/13/2007   Hyperlipidemia 03/13/2007   Depression 03/13/2007   PERIPHERAL NEUROPATHY  03/13/2007   Essential hypertension 03/13/2007   GASTROESOPHAGEAL REFLUX DISEASE 03/13/2007   Osteoarthritis 03/13/2007    PCP: Hoyt Koch, MD   REFERRING PROVIDER: Gregor Hams, MD   REFERRING DIAG: [phone] (ICD-10-CM) - Chronic pain of right knee R26.9 (ICD-10-CM) - Abnormality of gait    THERAPY DIAG:  Chronic pain of right knee   Muscle weakness (generalized)   Other abnormalities of gait and mobility   Rationale for Evaluation and Treatment Rehabilitation   ONSET DATE: Overall for a year with last couple of months getting worse   SUBJECTIVE:    SUBJECTIVE STATEMENT: Pt states that she is doing well today.  She is having less pain and was only a little sore at her ankles.       PERTINENT HISTORY: OA, L knee TKA in 1991, Anemia, Breast Cancer, Tuberculosis exposure at 88 years old   PAIN:  Are you having pain? Yes: NPRS scale: 7-8/10 Pain location: right knee Pain description: dull hurt Aggravating factors: standing Relieving factors: taking pressure off her knee   PRECAUTIONS: Fall   WEIGHT BEARING RESTRICTIONS No   FALLS:  Has patient fallen in last 6 months?  Pt denies falls, but states that she has "caught herself" about 3 times  LIVING ENVIRONMENT: Lives with:  lives alone with her [place], Alison Murray Lives in: House/apartment Stairs: Yes: External: 2 steps; can reach both Has following equipment at home: Single point cane, shower chair, and Grab bars   OCCUPATION: retired   PLOF: Independent and Leisure: Barista, quilting, playing on computer   PATIENT GOALS:  To avoid having right knee surgery and to decrease her pain.     OBJECTIVE:    DIAGNOSTIC FINDINGS:  Right knee radiograph on 07/29/21: IMPRESSION: 1. Severe medial and patellofemoral compartment osteoarthritis. 2. Small joint effusion.   PATIENT SURVEYS:  FOTO 43% (projected 55% by visit 12)   COGNITION:           Overall cognitive status: Within functional limits for  tasks assessed                          SENSATION: WFL   MUSCLE LENGTH: Right hamstring tightness with pain   POSTURE: rounded shoulders   PALPATION: Tender to palpation along right knee   LOWER EXTREMITY ROM:   Active ROM Right eval Left eval  Hip flexion      Hip extension      Hip abduction      Hip adduction      Hip internal rotation      Hip external rotation      Knee flexion 120 with pain 120  Knee extension 15 0  Ankle dorsiflexion      Ankle plantarflexion      Ankle inversion      Ankle eversion       (Blank rows = not tested)   LOWER EXTREMITY MMT:   R hip/knee strength of 4/5, L hip/knee strength 5/5   LOWER EXTREMITY SPECIAL TESTS:  Knee special tests: Anterior drawer test: negative and Posterior drawer test: negative   FUNCTIONAL TESTS:  5 times sit to stand: 18.84 Timed up and go (TUG): 15.9 sec   GAIT: Distance walked: 50 ft Assistive device utilized: None Level of assistance: Modified independence Comments: Antalgic gait with decreased weight bearing through RLE.       TODAY'S TREATMENT: 08/24/2021: NuStep x 8 min level 3 (PT present to discuss progress and goals) Seated heel and toe raises x 20 Seated LAQ 2 x 10 with 3 lb both Seated march x 20 with 3 lb both Seated hip ER 2 x 10  with 3 lb both Seated hamstring curl with red band 2 x 10 both Supine quad set with slider under heel 2 x 10 right  Supine heel slide 2 x 10 right Supine hip abduction 2 x 10 with slider under heel both Supine SLR 2 x 5  0 lb right Supine SAQ 2 x 10 right over purple ball 0 lb  08/24/2021: NuStep x 8 min level 3 (PT present to discuss progress and goals) Seated heel and toe raises x 20 Seated LAQ 2 x 10 with 2 lb both Seated march x 20 with 2 lb both Seated hip ER 2 x 10  with 1 lb both Seated hamstring curl with red band 2 x 10 Supine quad set with slider under heel 2 x 10 both Supine heel slide 2 x 10 right Supine hip abduction 2 x 10 with slider  under heel both Supine SLR 2 x 5  0 lb both  08/20/2021: NuStep x 5 min level 3 (PT present to discuss progress and goals) Seated heel and toe raises x 20 Seated LAQ  2 x 10 with 1 lb both Seated march x 20 with 1 lb both Seated hip ER 2 x 10  with 1 lb both Seated hamstring curl with red band 2 x 10 Supine quad set with slider under heel 2 x 10 right only Supine heel slide 2 x 10 right only Supine hip abduction 2 x 10 with slider under heel both Supine SLR  x 10  2 lb both  08/17/2021: Seated Rt hamstring curl red TB x10 reps  Seated PF with green TB x10 reps, Rt LE extended on table for knee stretch Seated hip adductor ball squeeze 10x5 sec hold Sit to stand 2x10 reps- mat table elevated  Standing Rt hip adduction slide with yellow TB x10 reps   Manual treatment:  MWM Rt knee extension (Open chain)  MWM Rt knee sit to stand 2x10 reps- pain/pressure was improved per pt report     PATIENT EDUCATION:  Education details: Issued HEP Person educated: Patient Education method: Explanation, Demonstration, and Handouts Education comprehension: verbalized understanding and returned demonstration     HOME EXERCISE PROGRAM: Access Code: [email or web address] URL: [email or web address] Date: 08/02/2021 Prepared by: Juel Burrow   Exercises - Seated Heel Toe Raises  - 2 x daily - 7 x weekly - 2 sets - 10 reps - Seated March  - 2 x daily - 7 x weekly - 2 sets - 10 reps - Seated Long Arc Quad  - 2 x daily - 7 x weekly - 2 sets - 10 reps - Seated Hip Adduction Isometrics with Ball  - 2 x daily - 7 x weekly - 2 sets - 10 reps   ASSESSMENT:   CLINICAL IMPRESSION: Katlen is progressing appropriately.  She was able to tolerate increased resistance on all seated exercises and was able to do SAQ with no anterior knee pain.  She continues to lack full extension on the right.  We encouraged her to focus on her propping for extension and doing quad sets.    She would benefit from continued  skilled PT for bilateral LE strengthening and flexibility exercises as well as ROM to right knee.        OBJECTIVE IMPAIRMENTS decreased balance, difficulty walking, decreased ROM, decreased strength, impaired flexibility, and pain.    ACTIVITY LIMITATIONS standing, squatting, and stairs   PARTICIPATION LIMITATIONS: laundry, community activity, and yard work   PERSONAL FACTORS Age, Time since onset of injury/illness/exacerbation, and 3+ comorbidities: OA, Hx of breast cancer, Hx of L TKA, HTN  are also affecting patient's functional outcome.    REHAB POTENTIAL: Good   CLINICAL DECISION MAKING: Evolving/moderate complexity   EVALUATION COMPLEXITY: Moderate     GOALS: Goals reviewed with patient? Yes   SHORT TERM GOALS: Target date: 08/23/2021  Pt will be independent with initial HEP. Baseline: Goal status: MET   2.  Pt will report at least a 40% improvement in symptoms since initial evaluation. Baseline:  Goal status: Progressing     LONG TERM GOALS: Target date: 09/27/2021    Pt will be independent with advanced HEP. Baseline:  Goal status: INITIAL   2.  Pt will increase FOTO to at least 55% to demonstrate improvements in functional mobility. Baseline: 43% Goal status: INITIAL   3.  Pt will increase right hip/knee strength to at least 4+/5 to allow for easier navigation of steps. Baseline: 4/5 Goal status: INITIAL   4.  Pt will report being able to stand/ambulate at least 20 minutes without increased pain to allow  her to complete functional and community activities. Baseline: pain 7/10 with standing Goal status: INITIAL   5.  Pt will increase right knee extension to 0 degrees to allow for improved gait pattern. Baseline:  missing 15 degrees from full extension Goal status:  INITIAL     PLAN: PT FREQUENCY: 2x/week   PT DURATION: 8 weeks   PLANNED INTERVENTIONS: Therapeutic exercises, Therapeutic activity, Neuromuscular re-education, Balance training, Gait  training, Patient/Family education, Self Care, Joint mobilization, Joint manipulation, Stair training, DME instructions, Aquatic Therapy, Dry Needling, Electrical stimulation, Cryotherapy, Moist heat, Taping, Ultrasound, Ionotophoresis 6m/ml Dexamethasone, Manual therapy, and Re-evaluation   PLAN FOR NEXT SESSION: progress and assess HEP as indicated, Pt did well with closed chain mobilization with movement during sit to stand, strengthening, flexibility     Ahron Hulbert B. Benzion Mesta, PT 08/26/21 3:27 PM  BSt[address] Marys[address] SCalhoun FallsGLake [place] [address]Phone # [phone] [phone]

## 2021-08-31 ENCOUNTER — Encounter: Payer: Medicare PPO | Admitting: Rehabilitative and Restorative Service Providers"

## 2021-09-02 ENCOUNTER — Ambulatory Visit: Payer: Medicare PPO

## 2021-09-07 ENCOUNTER — Encounter: Payer: Self-pay | Admitting: Rehabilitative and Restorative Service Providers"

## 2021-09-07 ENCOUNTER — Ambulatory Visit: Payer: Medicare PPO | Attending: Family Medicine | Admitting: Rehabilitative and Restorative Service Providers"

## 2021-09-07 DIAGNOSIS — G8929 Other chronic pain: Secondary | ICD-10-CM | POA: Diagnosis not present

## 2021-09-07 DIAGNOSIS — R2689 Other abnormalities of gait and mobility: Secondary | ICD-10-CM | POA: Diagnosis not present

## 2021-09-07 DIAGNOSIS — M25561 Pain in right knee: Secondary | ICD-10-CM | POA: Insufficient documentation

## 2021-09-07 DIAGNOSIS — M6281 Muscle weakness (generalized): Secondary | ICD-10-CM | POA: Insufficient documentation

## 2021-09-07 NOTE — Therapy (Signed)
OUTPATIENT PHYSICAL THERAPY TREATMENT NOTE   Patient Name: Daisy Bryant MRN: [id number] DOB:August 12, 1930, 88 y.o., female Today's Date: 09/07/2021    END OF SESSION:   PT End of Session - 09/07/21 1450     Visit Number 7    Number of Visits 16    Date for PT Re-Evaluation 09/27/21    Authorization Type [employer]    Authorization Time Period [employer] Approved 16 visits 08/02/2021-09/27/2021- [phone]    Authorization - Visit Number 7    Authorization - Number of Visits 16    Progress Note Due on Visit 10    PT Start Time 8563    PT Stop Time 1525    PT Time Calculation (min) 38 min    Activity Tolerance Patient tolerated treatment well;No increased pain;Patient limited by fatigue    Behavior During Therapy [facility] for tasks assessed/performed              Past Medical History:  Diagnosis Date   Acute gastric ulcer without mention of hemorrhage, perforation, or obstruction 2002   Anemia    Anxiety    Arthritis    Cancer ([place])    CAP (community acquired pneumonia) 07/04/2019   Colon polyp 2010   HYPERPLASTIC POLYP   Depression    Diverticulosis of colon (without mention of hemorrhage)    GERD (gastroesophageal reflux disease)    H/O hiatal hernia 2002   Hypertension    Hypothyroidism    Neuromuscular disorder ([place])    Stricture and stenosis of esophagus 2002   Tuberculosis    Past Surgical History:  Procedure Laterality Date   BREAST SURGERY     ENTEROSCOPY  11/09/2011   Procedure: ENTEROSCOPY;  Surgeon: Inda Castle, MD;  Location: [facility];  Service: Endoscopy;  Laterality: N/A;   EYE SURGERY     JOINT REPLACEMENT Left    knee   MASTECTOMY     TONSILLECTOMY  under age 25   Patient Active Problem List   Diagnosis Date Noted   Edema of foot 09/21/2020   Routine general medical examination at a health care facility 01/05/2018   B12 deficiency 10/06/2015   ANEMIA, IRON DEFICIENCY 04/22/2008   PEPTIC STRICTURE 04/17/2008   CELIAC DISEASE  09/19/2007   ADENOCARCINOMA, BREAST, RIGHT 03/13/2007   Hypothyroidism 03/13/2007   Hyperlipidemia 03/13/2007   Depression 03/13/2007   PERIPHERAL NEUROPATHY 03/13/2007   Essential hypertension 03/13/2007   GASTROESOPHAGEAL REFLUX DISEASE 03/13/2007   Osteoarthritis 03/13/2007    PCP: Hoyt Koch, MD   REFERRING PROVIDER: Gregor Hams, MD   REFERRING DIAG: [phone] (ICD-10-CM) - Chronic pain of right knee R26.9 (ICD-10-CM) - Abnormality of gait    THERAPY DIAG:  Chronic pain of right knee   Muscle weakness (generalized)   Other abnormalities of gait and mobility   Rationale for Evaluation and Treatment Rehabilitation   ONSET DATE: Overall for a year with last couple of months getting worse   SUBJECTIVE:    SUBJECTIVE STATEMENT: Pt is reporting increased knee pain today.   "The pain is not near as bad"     PERTINENT HISTORY: OA, L knee TKA in 1991, Anemia, Breast Cancer, Tuberculosis exposure at 88 years old   PAIN:  Are you having pain? Yes: NPRS scale: 8-9/10 Pain location: right knee Pain description: dull hurt Aggravating factors: standing Relieving factors: taking pressure off her knee   PRECAUTIONS: Fall   WEIGHT BEARING RESTRICTIONS No   FALLS:  Has patient fallen in last 6  months?  Pt denies falls, but states that she has "caught herself" about 3 times   LIVING ENVIRONMENT: Lives with:  lives alone with her [place], [place] Lives in: House/apartment Stairs: Yes: External: 2 steps; can reach both Has following equipment at home: Single point cane, shower chair, and Grab bars   OCCUPATION: retired   PLOF: Independent and Leisure: Barista, [place], playing on computer   PATIENT GOALS:  To avoid having right knee surgery and to decrease her pain.     OBJECTIVE:    DIAGNOSTIC FINDINGS:  Right knee radiograph on 07/29/21: IMPRESSION: 1. Severe medial and patellofemoral compartment osteoarthritis. 2. Small joint effusion.   PATIENT  SURVEYS:  FOTO 43% (projected 55% by visit 12)   COGNITION:           Overall cognitive status: Within functional limits for tasks assessed                          SENSATION: WFL   MUSCLE LENGTH: Right hamstring tightness with pain   POSTURE: rounded shoulders   PALPATION: Tender to palpation along right knee   LOWER EXTREMITY ROM:   Active ROM Right eval Right 09/07/2021 Left eval  Hip flexion       Hip extension       Hip abduction       Hip adduction       Hip internal rotation       Hip external rotation       Knee flexion 120 with pain 111 In supine 120  Knee extension 15 9 in supine 0  Ankle dorsiflexion       Ankle plantarflexion       Ankle inversion       Ankle eversion        (Blank rows = not tested)   LOWER EXTREMITY MMT:   R hip/knee strength of 4/5, L hip/knee strength 5/5   LOWER EXTREMITY SPECIAL TESTS:  Knee special tests: Anterior drawer test: negative and Posterior drawer test: negative   FUNCTIONAL TESTS:  5 times sit to stand: 18.84 Timed up and go (TUG): 15.9 sec   GAIT: Distance walked: 50 ft Assistive device utilized: None Level of assistance: Modified independence Comments: Antalgic gait with decreased weight bearing through RLE.       TODAY'S TREATMENT: 09/07/2021: Nustep level 3 x8 min with PT present to discuss status Seated with 3#:  heel/toe raises, marching, LAQ, hip ER.  BLE 2x10 each Seated hamstring curl with red band 2 x 10 both Supine heel slide 2 x 10 right foot on slider Supine quad set with slider under heel 2 x 10 right  Supine SAQ 2 x 10 right over purple ball Supine SLR 2x10 bilat Sidelying clamshells for 2x10 bilat  08/24/2021: NuStep x 8 min level 3 (PT present to discuss progress and goals) Seated heel and toe raises x 20 Seated LAQ 2 x 10 with 3 lb both Seated march x 20 with 3 lb both Seated hip ER 2 x 10  with 3 lb both Seated hamstring curl with red band 2 x 10 both Supine quad set with slider  under heel 2 x 10 right  Supine heel slide 2 x 10 right Supine hip abduction 2 x 10 with slider under heel both Supine SLR 2 x 5  0 lb right Supine SAQ 2 x 10 right over purple ball 0 lb  08/24/2021: NuStep x 8 min level 3 (PT  present to discuss progress and goals) Seated heel and toe raises x 20 Seated LAQ 2 x 10 with 2 lb both Seated march x 20 with 2 lb both Seated hip ER 2 x 10  with 1 lb both Seated hamstring curl with red band 2 x 10 Supine quad set with slider under heel 2 x 10 both Supine heel slide 2 x 10 right Supine hip abduction 2 x 10 with slider under heel both Supine SLR 2 x 5  0 lb both      PATIENT EDUCATION:  Education details: Issued HEP Person educated: Patient Education method: Explanation, Media planner, and Handouts Education comprehension: verbalized understanding and returned demonstration     HOME EXERCISE PROGRAM: Access Code: [email or web address] URL: [email or web address] Date: 08/02/2021 Prepared by: Juel Burrow   Exercises - Seated Heel Toe Raises  - 2 x daily - 7 x weekly - 2 sets - 10 reps - Seated March  - 2 x daily - 7 x weekly - 2 sets - 10 reps - Seated Long Arc Quad  - 2 x daily - 7 x weekly - 2 sets - 10 reps - Seated Hip Adduction Isometrics with Ball  - 2 x daily - 7 x weekly - 2 sets - 10 reps   ASSESSMENT:   CLINICAL IMPRESSION: Breda is progressing appropriately reporting at least 50% improvement since initial evaluation. Pt requires min cuing for pacing during therex to slow down to encourage improved muscle strengthening and contractions.  Pt has met short term goals at this time and is progressing towards long term goals.  Pt is progressing towards improved right knee extension A/ROM in supine. Able to add in sidelying clamshells today with reports of muscle fatigue, but no increased knee pain.  Pt continues to require skilled PT to progress towards goal related activities.     OBJECTIVE IMPAIRMENTS decreased balance,  difficulty walking, decreased ROM, decreased strength, impaired flexibility, and pain.    ACTIVITY LIMITATIONS standing, squatting, and stairs   PARTICIPATION LIMITATIONS: laundry, community activity, and yard work   PERSONAL FACTORS Age, Time since onset of injury/illness/exacerbation, and 3+ comorbidities: OA, Hx of breast cancer, Hx of L TKA, HTN  are also affecting patient's functional outcome.    REHAB POTENTIAL: Good   CLINICAL DECISION MAKING: Evolving/moderate complexity   EVALUATION COMPLEXITY: Moderate     GOALS: Goals reviewed with patient? Yes   SHORT TERM GOALS: Target date: 08/23/2021  Pt will be independent with initial HEP. Baseline: Goal status: MET   2.  Pt will report at least a 40% improvement in symptoms since initial evaluation. Baseline:  Goal status: Goal Met 09/07/21 (reports 50% improvement)     LONG TERM GOALS: Target date: 09/27/2021    Pt will be independent with advanced HEP. Baseline:  Goal status: Ongoing   2.  Pt will increase FOTO to at least 55% to demonstrate improvements in functional mobility. Baseline: 43% Goal status: INITIAL   3.  Pt will increase right hip/knee strength to at least 4+/5 to allow for easier navigation of steps. Baseline: 4/5 Goal status: INITIAL   4.  Pt will report being able to stand/ambulate at least 20 minutes without increased pain to allow her to complete functional and community activities. Baseline: pain 7/10 with standing Goal status: INITIAL   5.  Pt will increase right knee extension to 0 degrees to allow for improved gait pattern. Baseline:  missing 15 degrees from full extension Goal status:  Ongoing (see above)  PLAN: PT FREQUENCY: 2x/week   PT DURATION: 8 weeks   PLANNED INTERVENTIONS: Therapeutic exercises, Therapeutic activity, Neuromuscular re-education, Balance training, Gait training, Patient/Family education, Self Care, Joint mobilization, Joint manipulation, Stair training, DME  instructions, Aquatic Therapy, Dry Needling, Electrical stimulation, Cryotherapy, Moist heat, Taping, Ultrasound, Ionotophoresis 73m/ml Dexamethasone, Manual therapy, and Re-evaluation   PLAN FOR NEXT SESSION: progress and assess HEP as indicated, Pt did well with closed chain mobilization with movement during sit to stand, strengthening, flexibility     SJuel Burrow PT 09/07/21 3:43 PM   BF[id number] [place] [address]Phone # [phone] [phone]

## 2021-09-09 ENCOUNTER — Ambulatory Visit: Payer: Medicare PPO | Admitting: Family Medicine

## 2021-09-09 VITALS — BP 108/70 | HR 77 | Ht 67.5 in | Wt 222.0 lb

## 2021-09-09 DIAGNOSIS — M25561 Pain in right knee: Secondary | ICD-10-CM | POA: Diagnosis not present

## 2021-09-09 DIAGNOSIS — G8929 Other chronic pain: Secondary | ICD-10-CM | POA: Diagnosis not present

## 2021-09-09 DIAGNOSIS — R269 Unspecified abnormalities of gait and mobility: Secondary | ICD-10-CM

## 2021-09-09 NOTE — Progress Notes (Signed)
   I, Peterson Lombard, LAT, ATC acting as a scribe for Lynne Leader, MD.  Daisy Bryant is a 88 y.o. female who presents to [place] [facility] today for f/u chronic R knee pain due to exacerbation of DJD. Pt was last seen by Dr. Georgina Snell on 07/29/21 and was given a R knee steroid injection and was referred to PT, completing 7 visits. Today, pt reports R knee pain is about the same. Pt has been working on HEP, but R knee is still painful.   Dx imaging: 07/29/21 R knee XR   Pertinent review of systems: No fevers or chills  Relevant historical information: Hypertension   Exam:  BP 108/70   Pulse 77   Ht 5' 7.5" (1.715 m)   Wt 222 lb (100.7 kg)   SpO2 98%   BMI 34.26 kg/m  General: Well Developed, well nourished, and in no acute distress.   MSK: Right knee decreased range of motion with crepitation.    Lab and Radiology Results   EXAM: RIGHT KNEE 3 VIEWS   COMPARISON:  None Available.   FINDINGS: Severe medial compartment joint space narrowing with bone-on-bone contact. Large peripheral medial compartment degenerative osteophytes. Moderate peripheral lateral compartment degenerative osteophytosis without significant joint space narrowing.   Severe patellofemoral joint space narrowing and peripheral osteophytosis. Small joint effusion.   There are likely peripherally calcified foci seen about the knee predominantly anteriorly. These may represent vascular phleboliths versus less likely loose bodies.   No acute fracture or dislocation.   IMPRESSION: 1. Severe medial and patellofemoral compartment osteoarthritis. 2. Small joint effusion.     Electronically Signed   By: Yvonne Kendall M.D.   On: 07/30/2021 15:26  I, Lynne Leader, personally (independently) visualized and performed the interpretation of the images attached in this note.     Assessment and Plan: 88 y.o. female with right knee pain due to exacerbation of DJD.  Unfortunately her  pain is significant and persistent despite good trial of physical therapy so far and trial of steroid injection.  We will work on authorization now for hyaluronic acid injection series.  Plan to continue PT.  She wonders if a cane would help.  I think this is potentially a good idea but I advised her to ask her physical therapist who she is seeing tomorrow.  Anticipate recheck in the near future for hyaluronic acid injections.  Ultimately she would benefit from a total knee replacement but she is reluctant to consider it which is pretty reasonable considering her age and BMI.     Discussed warning signs or symptoms. Please see discharge instructions. Patient expresses understanding.   The above documentation has been reviewed and is accurate and complete Lynne Leader, M.D.

## 2021-09-09 NOTE — Patient Instructions (Signed)
Thank you for coming in today.   Plan for gel shots soon.   Ask your PT about your cane.   We will contact you soon about the shots.

## 2021-09-10 ENCOUNTER — Encounter: Payer: Self-pay | Admitting: Rehabilitative and Restorative Service Providers"

## 2021-09-10 ENCOUNTER — Ambulatory Visit: Payer: Medicare PPO | Admitting: Rehabilitative and Restorative Service Providers"

## 2021-09-10 DIAGNOSIS — G8929 Other chronic pain: Secondary | ICD-10-CM | POA: Diagnosis not present

## 2021-09-10 DIAGNOSIS — M6281 Muscle weakness (generalized): Secondary | ICD-10-CM

## 2021-09-10 DIAGNOSIS — R2689 Other abnormalities of gait and mobility: Secondary | ICD-10-CM

## 2021-09-10 DIAGNOSIS — M25561 Pain in right knee: Secondary | ICD-10-CM | POA: Diagnosis not present

## 2021-09-10 NOTE — Therapy (Signed)
OUTPATIENT PHYSICAL THERAPY TREATMENT NOTE   Patient Name: Daisy Bryant MRN: [id number] DOB:1930-11-24, 88 y.o., female Today's Date: 09/10/2021    END OF SESSION:   PT End of Session - 09/10/21 1106     Visit Number 8    Date for PT Re-Evaluation 09/27/21    Authorization Type [employer]    Authorization Time Period [employer] Approved 16 visits 08/02/2021-09/27/2021- [phone]    Authorization - Visit Number 8    Authorization - Number of Visits 16    Progress Note Due on Visit 10    PT Start Time 1103    PT Stop Time 1141    PT Time Calculation (min) 38 min    Activity Tolerance Patient tolerated treatment well    Behavior During Therapy [facility] for tasks assessed/performed              Past Medical History:  Diagnosis Date   Acute gastric ulcer without mention of hemorrhage, perforation, or obstruction 2002   Anemia    Anxiety    Arthritis    Cancer ([place])    CAP (community acquired pneumonia) 07/04/2019   Colon polyp 2010   HYPERPLASTIC POLYP   Depression    Diverticulosis of colon (without mention of hemorrhage)    GERD (gastroesophageal reflux disease)    H/O hiatal hernia 2002   Hypertension    Hypothyroidism    Neuromuscular disorder ([place])    Stricture and stenosis of esophagus 2002   Tuberculosis    Past Surgical History:  Procedure Laterality Date   BREAST SURGERY     ENTEROSCOPY  11/09/2011   Procedure: ENTEROSCOPY;  Surgeon: Inda Castle, MD;  Location: [facility];  Service: Endoscopy;  Laterality: N/A;   EYE SURGERY     JOINT REPLACEMENT Left    knee   MASTECTOMY     TONSILLECTOMY  under age 70   Patient Active Problem List   Diagnosis Date Noted   Edema of foot 09/21/2020   Routine general medical examination at a health care facility 01/05/2018   B12 deficiency 10/06/2015   ANEMIA, IRON DEFICIENCY 04/22/2008   PEPTIC STRICTURE 04/17/2008   CELIAC DISEASE 09/19/2007   ADENOCARCINOMA, BREAST, RIGHT 03/13/2007   Hypothyroidism  03/13/2007   Hyperlipidemia 03/13/2007   Depression 03/13/2007   PERIPHERAL NEUROPATHY 03/13/2007   Essential hypertension 03/13/2007   GASTROESOPHAGEAL REFLUX DISEASE 03/13/2007   Osteoarthritis 03/13/2007    PCP: Hoyt Koch, MD   REFERRING PROVIDER: Gregor Hams, MD   REFERRING DIAG: [phone] (ICD-10-CM) - Chronic pain of right knee R26.9 (ICD-10-CM) - Abnormality of gait    THERAPY DIAG:  Chronic pain of right knee   Muscle weakness (generalized)   Other abnormalities of gait and mobility   Rationale for Evaluation and Treatment Rehabilitation   ONSET DATE: Overall for a year with last couple of months getting worse   SUBJECTIVE:    SUBJECTIVE STATEMENT: Pt states that she is having some knee pain today, more on the inside of her thigh   PERTINENT HISTORY: OA, L knee TKA in 1991, Anemia, Breast Cancer, Tuberculosis exposure at 88 years old   PAIN:  Are you having pain? Yes: NPRS scale: 5/10 Pain location: right knee Pain description: dull hurt Aggravating factors: standing Relieving factors: taking pressure off her knee   PRECAUTIONS: Fall   WEIGHT BEARING RESTRICTIONS No   FALLS:  Has patient fallen in last 6 months?  Pt denies falls, but states that she has "caught herself" about  3 times   LIVING ENVIRONMENT: Lives with:  lives alone with her [place], Alison Murray Lives in: House/apartment Stairs: Yes: External: 2 steps; can reach both Has following equipment at home: Single point cane, shower chair, and Grab bars   OCCUPATION: retired   PLOF: Independent and Leisure: Barista, quilting, playing on computer   PATIENT GOALS:  To avoid having right knee surgery and to decrease her pain.     OBJECTIVE:    DIAGNOSTIC FINDINGS:  Right knee radiograph on 07/29/21: IMPRESSION: 1. Severe medial and patellofemoral compartment osteoarthritis. 2. Small joint effusion.   PATIENT SURVEYS:  FOTO 43% (projected 55% by visit 12)   COGNITION:            Overall cognitive status: Within functional limits for tasks assessed                          SENSATION: WFL   MUSCLE LENGTH: Right hamstring tightness with pain   POSTURE: rounded shoulders   PALPATION: Tender to palpation along right knee   LOWER EXTREMITY ROM:   Active ROM Right eval Right 09/07/2021 Left eval  Hip flexion       Hip extension       Hip abduction       Hip adduction       Hip internal rotation       Hip external rotation       Knee flexion 120 with pain 111 In supine 120  Knee extension 15 9 in supine 0  Ankle dorsiflexion       Ankle plantarflexion       Ankle inversion       Ankle eversion        (Blank rows = not tested)   LOWER EXTREMITY MMT:   R hip/knee strength of 4/5, L hip/knee strength 5/5   LOWER EXTREMITY SPECIAL TESTS:  Knee special tests: Anterior drawer test: negative and Posterior drawer test: negative   FUNCTIONAL TESTS:  5 times sit to stand: 18.84 Timed up and go (TUG): 15.9 sec   GAIT: Distance walked: 50 ft Assistive device utilized: None Level of assistance: Modified independence Comments: Antalgic gait with decreased weight bearing through RLE.       TODAY'S TREATMENT: 09/10/2021: Nustep level 3 x8 min with PT present to discuss status Seated with 3#:  heel/toe raises, marching, LAQ, hip ER.  BLE 2x10 each Hip abduction ball squeeze 2x10 Seated hamstring curl with red band 2 x 10 both Ambulation x100 ft with SPC and cuing for gait pattern and sequencing Supine quad set with slider under heel 2 x 10 bilat Supine heel slide 2 x 10 foot on slider bilat   09/07/2021: Nustep level 3 x8 min with PT present to discuss status Seated with 3#:  heel/toe raises, marching, LAQ, hip ER.  BLE 2x10 each Seated hamstring curl with red band 2 x 10 both Supine heel slide 2 x 10 right foot on slider Supine quad set with slider under heel 2 x 10 right  Supine SAQ 2 x 10 right over purple ball Supine SLR 2x10 bilat Sidelying  clamshells for 2x10 bilat  08/24/2021: NuStep x 8 min level 3 (PT present to discuss progress and goals) Seated heel and toe raises x 20 Seated LAQ 2 x 10 with 3 lb both Seated march x 20 with 3 lb both Seated hip ER 2 x 10  with 3 lb both Seated hamstring curl with red band  2 x 10 both Supine quad set with slider under heel 2 x 10 right  Supine heel slide 2 x 10 right Supine hip abduction 2 x 10 with slider under heel both Supine SLR 2 x 5  0 lb right Supine SAQ 2 x 10 right over purple ball 0 lb       PATIENT EDUCATION:  Education details: Issued HEP Person educated: Patient Education method: Explanation, Media planner, and Handouts Education comprehension: verbalized understanding and returned demonstration     HOME EXERCISE PROGRAM: Access Code: [email or web address] URL: [email or web address] Date: 08/02/2021 Prepared by: Juel Burrow   Exercises - Seated Heel Toe Raises  - 2 x daily - 7 x weekly - 2 sets - 10 reps - Seated March  - 2 x daily - 7 x weekly - 2 sets - 10 reps - Seated Long Arc Quad  - 2 x daily - 7 x weekly - 2 sets - 10 reps - Seated Hip Adduction Isometrics with Ball  - 2 x daily - 7 x weekly - 2 sets - 10 reps   ASSESSMENT:   CLINICAL IMPRESSION: Verdelle is progressing appropriately and reporting overall improvements.  Pt  states that she has a SPC at home and wonders if she should ambulate with a cane.  Practiced ambulation with SPC in clinic today and pt with improved stability noted.  She states that she did feel more secure and may start using her cane at home.  Pt able to tolerate therex with some brief recovery periods required.  Pt continues to require skilled PT to progress towards goal related activities.     OBJECTIVE IMPAIRMENTS decreased balance, difficulty walking, decreased ROM, decreased strength, impaired flexibility, and pain.    ACTIVITY LIMITATIONS standing, squatting, and stairs   PARTICIPATION LIMITATIONS: laundry, community  activity, and yard work   PERSONAL FACTORS Age, Time since onset of injury/illness/exacerbation, and 3+ comorbidities: OA, Hx of breast cancer, Hx of L TKA, HTN  are also affecting patient's functional outcome.    REHAB POTENTIAL: Good   CLINICAL DECISION MAKING: Evolving/moderate complexity   EVALUATION COMPLEXITY: Moderate     GOALS: Goals reviewed with patient? Yes   SHORT TERM GOALS: Target date: 08/23/2021  Pt will be independent with initial HEP. Baseline: Goal status: MET   2.  Pt will report at least a 40% improvement in symptoms since initial evaluation. Baseline:  Goal status: Goal Met 09/07/21 (reports 50% improvement)     LONG TERM GOALS: Target date: 09/27/2021    Pt will be independent with advanced HEP. Baseline:  Goal status: Ongoing   2.  Pt will increase FOTO to at least 55% to demonstrate improvements in functional mobility. Baseline: 43% Goal status: INITIAL   3.  Pt will increase right hip/knee strength to at least 4+/5 to allow for easier navigation of steps. Baseline: 4/5 Goal status: INITIAL   4.  Pt will report being able to stand/ambulate at least 20 minutes without increased pain to allow her to complete functional and community activities. Baseline: pain 7/10 with standing Goal status: INITIAL   5.  Pt will increase right knee extension to 0 degrees to allow for improved gait pattern. Baseline:  missing 15 degrees from full extension Goal status:  Ongoing (see above)     PLAN: PT FREQUENCY: 2x/week   PT DURATION: 8 weeks   PLANNED INTERVENTIONS: Therapeutic exercises, Therapeutic activity, Neuromuscular re-education, Balance training, Gait training, Patient/Family education, Self Care, Joint mobilization, Joint manipulation, Stair training, DME instructions,  Aquatic Therapy, Dry Needling, Electrical stimulation, Cryotherapy, Moist heat, Taping, Ultrasound, Ionotophoresis 68m/ml Dexamethasone, Manual therapy, and Re-evaluation   PLAN FOR  NEXT SESSION: progress and assess HEP as indicated, Pt did well with closed chain mobilization with movement during sit to stand, strengthening, flexibility     SJuel Burrow PT 09/10/21 11:47 AM   [place] [place] [address]Phone # [phone] [phone]

## 2021-09-14 ENCOUNTER — Ambulatory Visit: Payer: Medicare PPO | Admitting: Rehabilitative and Restorative Service Providers"

## 2021-09-14 ENCOUNTER — Encounter: Payer: Self-pay | Admitting: Rehabilitative and Restorative Service Providers"

## 2021-09-14 DIAGNOSIS — R2689 Other abnormalities of gait and mobility: Secondary | ICD-10-CM | POA: Diagnosis not present

## 2021-09-14 DIAGNOSIS — M6281 Muscle weakness (generalized): Secondary | ICD-10-CM

## 2021-09-14 DIAGNOSIS — G8929 Other chronic pain: Secondary | ICD-10-CM | POA: Diagnosis not present

## 2021-09-14 DIAGNOSIS — M25561 Pain in right knee: Secondary | ICD-10-CM | POA: Diagnosis not present

## 2021-09-14 NOTE — Therapy (Signed)
OUTPATIENT PHYSICAL THERAPY TREATMENT NOTE   Patient Name: Daisy Bryant MRN: [id number] DOB:22-Mar-1930, 88 y.o., female Today's Date: 09/14/2021    END OF SESSION:   PT End of Session - 09/14/21 1142     Visit Number 9    Date for PT Re-Evaluation 09/27/21    Authorization Type [employer]    Authorization Time Period [employer] Approved 16 visits 08/02/2021-09/27/2021- [phone]    Authorization - Visit Number 9    Authorization - Number of Visits 16    Progress Note Due on Visit 10    PT Start Time 1139    PT Stop Time 1217    PT Time Calculation (min) 38 min    Activity Tolerance Patient tolerated treatment well    Behavior During Therapy [facility] for tasks assessed/performed              Past Medical History:  Diagnosis Date   Acute gastric ulcer without mention of hemorrhage, perforation, or obstruction 2002   Anemia    Anxiety    Arthritis    Cancer ([place])    CAP (community acquired pneumonia) 07/04/2019   Colon polyp 2010   HYPERPLASTIC POLYP   Depression    Diverticulosis of colon (without mention of hemorrhage)    GERD (gastroesophageal reflux disease)    H/O hiatal hernia 2002   Hypertension    Hypothyroidism    Neuromuscular disorder ([place])    Stricture and stenosis of esophagus 2002   Tuberculosis    Past Surgical History:  Procedure Laterality Date   BREAST SURGERY     ENTEROSCOPY  11/09/2011   Procedure: ENTEROSCOPY;  Surgeon: Inda Castle, MD;  Location: [facility];  Service: Endoscopy;  Laterality: N/A;   EYE SURGERY     JOINT REPLACEMENT Left    knee   MASTECTOMY     TONSILLECTOMY  under age 48   Patient Active Problem List   Diagnosis Date Noted   Edema of foot 09/21/2020   Routine general medical examination at a health care facility 01/05/2018   B12 deficiency 10/06/2015   ANEMIA, IRON DEFICIENCY 04/22/2008   PEPTIC STRICTURE 04/17/2008   CELIAC DISEASE 09/19/2007   ADENOCARCINOMA, BREAST, RIGHT 03/13/2007   Hypothyroidism  03/13/2007   Hyperlipidemia 03/13/2007   Depression 03/13/2007   PERIPHERAL NEUROPATHY 03/13/2007   Essential hypertension 03/13/2007   GASTROESOPHAGEAL REFLUX DISEASE 03/13/2007   Osteoarthritis 03/13/2007    PCP: Hoyt Koch, MD   REFERRING PROVIDER: Gregor Hams, MD   REFERRING DIAG: [phone] (ICD-10-CM) - Chronic pain of right knee R26.9 (ICD-10-CM) - Abnormality of gait    THERAPY DIAG:  Chronic pain of right knee   Muscle weakness (generalized)   Other abnormalities of gait and mobility   Rationale for Evaluation and Treatment Rehabilitation   ONSET DATE: Overall for a year with last couple of months getting worse   SUBJECTIVE:    SUBJECTIVE STATEMENT: Pt denies pain when sitting/at rest, but states pain occurs with movement.  States that she is getting her 1/3 gel injections into her knee tomorrow.   PERTINENT HISTORY: OA, L knee TKA in 1991, Anemia, Breast Cancer, Tuberculosis exposure at 88 years old   PAIN:  Are you having pain? Yes: NPRS scale: 0-6/10 Pain location: right knee Pain description: dull hurt Aggravating factors: standing Relieving factors: taking pressure off her knee   PRECAUTIONS: Fall   WEIGHT BEARING RESTRICTIONS No   FALLS:  Has patient fallen in last 6 months?  Pt denies  falls, but states that she has "caught herself" about 3 times   LIVING ENVIRONMENT: Lives with:  lives alone with her [place], [place] Lives in: House/apartment Stairs: Yes: External: 2 steps; can reach both Has following equipment at home: Single point cane, shower chair, and Grab bars   OCCUPATION: retired   PLOF: Independent and Leisure: Barista, [place], playing on computer   PATIENT GOALS:  To avoid having right knee surgery and to decrease her pain.     OBJECTIVE:    DIAGNOSTIC FINDINGS:  Right knee radiograph on 07/29/21: IMPRESSION: 1. Severe medial and patellofemoral compartment osteoarthritis. 2. Small joint effusion.   PATIENT  SURVEYS:  FOTO 43% (projected 55% by visit 12)   COGNITION:           Overall cognitive status: Within functional limits for tasks assessed                          SENSATION: WFL   MUSCLE LENGTH: Right hamstring tightness with pain   POSTURE: rounded shoulders   PALPATION: Tender to palpation along right knee   LOWER EXTREMITY ROM:   Active ROM Right eval Right 09/07/2021 Left eval  Hip flexion       Hip extension       Hip abduction       Hip adduction       Hip internal rotation       Hip external rotation       Knee flexion 120 with pain 111 In supine 120  Knee extension 15 9 in supine 0  Ankle dorsiflexion       Ankle plantarflexion       Ankle inversion       Ankle eversion        (Blank rows = not tested)   LOWER EXTREMITY MMT:   R hip/knee strength of 4/5, L hip/knee strength 5/5   LOWER EXTREMITY SPECIAL TESTS:  Knee special tests: Anterior drawer test: negative and Posterior drawer test: negative   FUNCTIONAL TESTS:  Eval: 5 times sit to stand: 18.84 Timed up and go (TUG): 15.9 sec    GAIT: Distance walked: 50 ft Assistive device utilized: None Level of assistance: Modified independence Comments: Antalgic gait with decreased weight bearing through RLE.       TODAY'S TREATMENT: 09/14/2021: Nustep level 3 x8 min with PT present to discuss status Seated with 3#:  heel/toe raises, marching, LAQ, hip ER.  BLE 2x10 each Hip abduction ball squeeze 2x10 Seated hamstring curl with red band 2 x 10 both Ambulation 2x100 ft with SPC and cuing for sequencing; seated recovery period required after each ambulation trial Standing calf stretch using 2" step 2x20 sec Standing at barre performing high marching x10 reps bilat   09/10/2021: Nustep level 3 x8 min with PT present to discuss status Seated with 3#:  heel/toe raises, marching, LAQ, hip ER.  BLE 2x10 each Hip abduction ball squeeze 2x10 Seated hamstring curl with red band 2 x 10 both Ambulation  x100 ft with SPC and cuing for gait pattern and sequencing Supine quad set with slider under heel 2 x 10 bilat Supine heel slide 2 x 10 foot on slider bilat   09/07/2021: Nustep level 3 x8 min with PT present to discuss status Seated with 3#:  heel/toe raises, marching, LAQ, hip ER.  BLE 2x10 each Seated hamstring curl with red band 2 x 10 both Supine heel slide 2 x 10 right foot  on slider Supine quad set with slider under heel 2 x 10 right  Supine SAQ 2 x 10 right over purple ball Supine SLR 2x10 bilat Sidelying clamshells for 2x10 bilat    PATIENT EDUCATION:  Education details: Issued HEP Person educated: Patient Education method: Explanation, Demonstration, and Handouts Education comprehension: verbalized understanding and returned demonstration     HOME EXERCISE PROGRAM: Access Code: [email or web address] URL: [email or web address] Date: 08/02/2021 Prepared by: Juel Burrow   Exercises - Seated Heel Toe Raises  - 2 x daily - 7 x weekly - 2 sets - 10 reps - Seated March  - 2 x daily - 7 x weekly - 2 sets - 10 reps - Seated Long Arc Quad  - 2 x daily - 7 x weekly - 2 sets - 10 reps - Seated Hip Adduction Isometrics with Ball  - 2 x daily - 7 x weekly - 2 sets - 10 reps   ASSESSMENT:   CLINICAL IMPRESSION: [place]ra is progressing appropriately and reporting overall improvements.  Pt is progressing with ambulation using SPC with improved safety noted, recommended to pt to start using SPC at all times, pt verbalizes understanding.  Pt will have knee injection on 09/15/21, so will assess after injection how pt is progressing.  Pt able to perform some standing exercises during session today.  Pt continues to require skilled PT to progress towards goal related activities.     OBJECTIVE IMPAIRMENTS decreased balance, difficulty walking, decreased ROM, decreased strength, impaired flexibility, and pain.    ACTIVITY LIMITATIONS standing, squatting, and stairs   PARTICIPATION  LIMITATIONS: laundry, community activity, and yard work   PERSONAL FACTORS Age, Time since onset of injury/illness/exacerbation, and 3+ comorbidities: OA, Hx of breast cancer, Hx of L TKA, HTN  are also affecting patient's functional outcome.    REHAB POTENTIAL: Good   CLINICAL DECISION MAKING: Evolving/moderate complexity   EVALUATION COMPLEXITY: Moderate     GOALS: Goals reviewed with patient? Yes   SHORT TERM GOALS: Target date: 08/23/2021  Pt will be independent with initial HEP. Baseline: Goal status: MET   2.  Pt will report at least a 40% improvement in symptoms since initial evaluation. Baseline:  Goal status: Goal Met 09/07/21 (reports 50% improvement)     LONG TERM GOALS: Target date: 09/27/2021    Pt will be independent with advanced HEP. Baseline:  Goal status: Ongoing   2.  Pt will increase FOTO to at least 55% to demonstrate improvements in functional mobility. Baseline: 43% Goal status: INITIAL   3.  Pt will increase right hip/knee strength to at least 4+/5 to allow for easier navigation of steps. Baseline: 4/5 Goal status: INITIAL   4.  Pt will report being able to stand/ambulate at least 20 minutes without increased pain to allow her to complete functional and community activities. Baseline: pain 7/10 with standing Goal status: INITIAL   5.  Pt will increase right knee extension to 0 degrees to allow for improved gait pattern. Baseline:  missing 15 degrees from full extension Goal status:  Ongoing (see above)     PLAN: PT FREQUENCY: 2x/week   PT DURATION: 8 weeks   PLANNED INTERVENTIONS: Therapeutic exercises, Therapeutic activity, Neuromuscular re-education, Balance training, Gait training, Patient/Family education, Self Care, Joint mobilization, Joint manipulation, Stair training, DME instructions, Aquatic Therapy, Dry Needling, Electrical stimulation, Cryotherapy, Moist heat, Taping, Ultrasound, Ionotophoresis 67m/ml Dexamethasone, Manual therapy,  and Re-evaluation   PLAN FOR NEXT SESSION: progress and assess HEP as indicated, Pt did well with  closed chain mobilization with movement during sit to stand, strengthening, flexibility     Juel Burrow, PT 09/14/21 12:22 PM   [facility] [address], [place] [address] [place], [place] [address] Phone # [phone] Fax [phone]

## 2021-09-15 ENCOUNTER — Ambulatory Visit: Payer: Self-pay

## 2021-09-15 ENCOUNTER — Ambulatory Visit: Payer: Medicare PPO | Admitting: Family Medicine

## 2021-09-15 VITALS — BP 108/70 | HR 78 | Wt 222.0 lb

## 2021-09-15 DIAGNOSIS — G8929 Other chronic pain: Secondary | ICD-10-CM | POA: Diagnosis not present

## 2021-09-15 DIAGNOSIS — M1711 Unilateral primary osteoarthritis, right knee: Secondary | ICD-10-CM

## 2021-09-15 DIAGNOSIS — M25561 Pain in right knee: Secondary | ICD-10-CM

## 2021-09-15 MED ORDER — HYALURONAN 30 MG/2ML IX SOSY
30.0000 mg | PREFILLED_SYRINGE | Freq: Once | INTRA_ARTICULAR | Status: AC
  Administered 2021-09-15: 30 mg via INTRA_ARTICULAR

## 2021-09-15 NOTE — Patient Instructions (Signed)
Thank you for coming in today.   You received an injection today. Seek immediate medical attention if the joint becomes red, extremely painful, or is oozing fluid.   Schedule you 2nd and 3rd injections for the upcoming weeks

## 2021-09-15 NOTE — Progress Notes (Signed)
   I, Peterson Lombard, LAT, ATC acting as a scribe for Lynne Leader, MD.  Daisy Bryant is a 88 y.o. female who presents to [place] [facility] today for cont'd chronic R knee pain due to exacerbation of DJD. Pt was last seen by Dr. Georgina Snell on 09/09/21 and was advised to cont PT and ask her physical therapist about using a cane, while we work on authorizing gel shots. Today, pt reports R knee pain is the same. Pt is wanting to give the gel shots a try.  Dx imaging: 07/29/21 R knee XR  Pertinent review of systems: No fevers or chills  Relevant historical information: Hypertension   Exam:  BP 108/70   Pulse 78   Wt 222 lb (100.7 kg)   SpO2 97%   BMI 34.26 kg/m  General: Well Developed, well nourished, and in no acute distress.   MSK: Right knee mild effusion normal motion with crepitation.    Lab and Radiology Results  Orthovisc injection right knee 1/3 Procedure: Real-time Ultrasound Guided Injection of right knee superior lateral patellar space Device: Philips Affiniti 50G Images permanently stored and available for review in PACS Verbal informed consent obtained.  Discussed risks and benefits of procedure. Warned about infection, bleeding, hyperglycemia damage to structures among others. Patient expresses understanding and agreement Time-out conducted.   Noted no overlying erythema, induration, or other signs of local infection.   Skin prepped in a sterile fashion.   Local anesthesia: Topical Ethyl chloride.   With sterile technique and under real time ultrasound guidance: Orthovisc 30 mg injected into knee joint. Fluid seen entering the joint capsule.   Completed without difficulty   Spinal needle used Advised to call if fevers/chills, erythema, induration, drainage, or persistent bleeding.   Images permanently stored and available for review in the ultrasound unit.  Impression: Technically successful ultrasound guided injection. Lot number:  [id number]       Assessment and Plan: 88 y.o. female with right knee pain due to exacerbation of DJD.  Orthovisc series started today.  We had a discussion about which product to use.  She is approved for both Orthovisc and Monovisc.  After discussion we decided mutually upon the 3 shot Orthovisc series.  Recheck next week for Orthovisc injection 2/3.  I am out of the office next Wednesday but will be able to accommodate her on Friday which is in 9 days.   PDMP not reviewed this encounter. Orders Placed This Encounter  Procedures   [place] LIMITED JOINT SPACE STRUCTURES LOW RIGHT(NO LINKED CHARGES)    Order Specific Question:   Reason for Exam (SYMPTOM  OR DIAGNOSIS REQUIRED)    Answer:   right knee pain    Order Specific Question:   Preferred imaging location?    Answer:   [place]   Meds ordered this encounter  Medications   Hyaluronan (ORTHOVISC) intra-articular injection 30 mg     Discussed warning signs or symptoms. Please see discharge instructions. Patient expresses understanding.   The above documentation has been reviewed and is accurate and complete Lynne Leader, M.D.

## 2021-09-16 ENCOUNTER — Encounter: Payer: Medicare PPO | Admitting: Rehabilitative and Restorative Service Providers"

## 2021-09-21 ENCOUNTER — Ambulatory Visit: Payer: Medicare PPO | Admitting: Rehabilitative and Restorative Service Providers"

## 2021-09-21 ENCOUNTER — Encounter: Payer: Self-pay | Admitting: Rehabilitative and Restorative Service Providers"

## 2021-09-21 DIAGNOSIS — R2689 Other abnormalities of gait and mobility: Secondary | ICD-10-CM

## 2021-09-21 DIAGNOSIS — G8929 Other chronic pain: Secondary | ICD-10-CM | POA: Diagnosis not present

## 2021-09-21 DIAGNOSIS — M6281 Muscle weakness (generalized): Secondary | ICD-10-CM | POA: Diagnosis not present

## 2021-09-21 DIAGNOSIS — M25561 Pain in right knee: Secondary | ICD-10-CM | POA: Diagnosis not present

## 2021-09-21 NOTE — Therapy (Signed)
OUTPATIENT PHYSICAL THERAPY TREATMENT NOTE   Patient Name: Daisy Bryant MRN: [id number] DOB:March 28, 1930, 88 y.o., female Today's Date: 09/21/2021   Progress Note Reporting Period 08/02/2021 to 09/21/2021  See note below for Objective Data and Assessment of Progress/Goals.        END OF SESSION:   PT End of Session - 09/21/21 1150     Visit Number 10    Number of Visits 16    Date for PT Re-Evaluation 09/27/21    Authorization Type [employer]    Authorization Time Period [employer] Approved 16 visits 08/02/2021-09/27/2021- [phone]    Authorization - Visit Number 10    Authorization - Number of Visits 16    Progress Note Due on Visit 20    PT Start Time 1148    PT Stop Time 1226    PT Time Calculation (min) 38 min    Activity Tolerance Patient tolerated treatment well    Behavior During Therapy WFL for tasks assessed/performed              Past Medical History:  Diagnosis Date   Acute gastric ulcer without mention of hemorrhage, perforation, or obstruction 2002   Anemia    Anxiety    Arthritis    Cancer ([place])    CAP (community acquired pneumonia) 07/04/2019   Colon polyp 2010   HYPERPLASTIC POLYP   Depression    Diverticulosis of colon (without mention of hemorrhage)    GERD (gastroesophageal reflux disease)    H/O hiatal hernia 2002   Hypertension    Hypothyroidism    Neuromuscular disorder ([place])    Stricture and stenosis of esophagus 2002   Tuberculosis    Past Surgical History:  Procedure Laterality Date   BREAST SURGERY     ENTEROSCOPY  11/09/2011   Procedure: ENTEROSCOPY;  Surgeon: Inda Castle, MD;  Location: [facility];  Service: Endoscopy;  Laterality: N/A;   EYE SURGERY     JOINT REPLACEMENT Left    knee   MASTECTOMY     TONSILLECTOMY  under age 30   Patient Active Problem List   Diagnosis Date Noted   Edema of foot 09/21/2020   Routine general medical examination at a health care facility 01/05/2018   B12 deficiency  10/06/2015   ANEMIA, IRON DEFICIENCY 04/22/2008   PEPTIC STRICTURE 04/17/2008   CELIAC DISEASE 09/19/2007   ADENOCARCINOMA, BREAST, RIGHT 03/13/2007   Hypothyroidism 03/13/2007   Hyperlipidemia 03/13/2007   Depression 03/13/2007   PERIPHERAL NEUROPATHY 03/13/2007   Essential hypertension 03/13/2007   GASTROESOPHAGEAL REFLUX DISEASE 03/13/2007   Osteoarthritis 03/13/2007    PCP: Hoyt Koch, MD   REFERRING PROVIDER: Gregor Hams, MD   REFERRING DIAG: [id number] (ICD-10-CM) - Chronic pain of right knee R26.9 (ICD-10-CM) - Abnormality of gait    THERAPY DIAG:  Chronic pain of right knee   Muscle weakness (generalized)   Other abnormalities of gait and mobility   Rationale for Evaluation and Treatment Rehabilitation   ONSET DATE: Overall for a year with last couple of months getting worse   SUBJECTIVE:    SUBJECTIVE STATEMENT: Pt reports that she had the first injection, she states that she gets her second on on 09/24/21 and again on 10/01/21.   PERTINENT HISTORY: OA, L knee TKA in 1991, Anemia, Breast Cancer, Tuberculosis exposure at 88 years old   PAIN:  Are you having pain? Yes: NPRS scale: 0-6/10 Pain location: right knee Pain description: dull hurt Aggravating factors: standing Relieving factors: taking pressure  off her knee   PRECAUTIONS: Fall   WEIGHT BEARING RESTRICTIONS No   FALLS:  Has patient fallen in last 6 months?  Pt denies falls, but states that she has "caught herself" about 3 times   LIVING ENVIRONMENT: Lives with:  lives alone with her [place], [place] Lives in: House/apartment Stairs: Yes: External: 2 steps; can reach both Has following equipment at home: Single point cane, shower chair, and Grab bars   OCCUPATION: retired   PLOF: Independent and Leisure: Barista, [place], playing on computer   PATIENT GOALS:  To avoid having right knee surgery and to decrease her pain.     OBJECTIVE:    DIAGNOSTIC FINDINGS:  Right knee  radiograph on 07/29/21: IMPRESSION: 1. Severe medial and patellofemoral compartment osteoarthritis. 2. Small joint effusion.   PATIENT SURVEYS:  Eval:  FOTO 43% (projected 55% by visit 12) 09/21/2021:  56%   COGNITION:           Overall cognitive status: Within functional limits for tasks assessed                          SENSATION: WFL   MUSCLE LENGTH: Right hamstring tightness with pain   POSTURE: rounded shoulders   PALPATION: Tender to palpation along right knee   LOWER EXTREMITY ROM:   Active ROM Right eval Right 09/07/2021 Right 09/21/2021 Left eval  Hip flexion        Hip extension        Hip abduction        Hip adduction        Hip internal rotation        Hip external rotation        Knee flexion 120 with pain 111 In supine 115 120  Knee extension 15 9 in supine 9 0  Ankle dorsiflexion        Ankle plantarflexion        Ankle inversion        Ankle eversion         (Blank rows = not tested)   LOWER EXTREMITY MMT:   R hip/knee strength of 4/5, L hip/knee strength 5/5   LOWER EXTREMITY SPECIAL TESTS:  Knee special tests: Anterior drawer test: negative and Posterior drawer test: negative   FUNCTIONAL TESTS:  Eval: 5 times sit to stand: 18.84 Timed up and go (TUG): 15.9 sec  09/21/2021: 5 times sit to stand: 13.5 sec from PT mat Timed up and go (TUG): 13.5 sec without assistive device    GAIT: Distance walked: 50 ft Assistive device utilized: None Level of assistance: Modified independence Comments: Antalgic gait with decreased weight bearing through RLE.       TODAY'S TREATMENT:  09/21/2021: Nustep level 3 x8 min with PT present to discuss status FOTO, 5 times sit to/from stand, and TUG (see above) Seated with 3#:  heel/toe raises, marching, LAQ, hip ER.  BLE 2x10 each Hip abduction ball squeeze 2x10 Seated hamstring curl with red band 2 x 10 both Amb 3 x 75 ft with SPC with improved stride length noted during  session   09/14/2021: Nustep level 3 x8 min with PT present to discuss status Seated with 3#:  heel/toe raises, marching, LAQ, hip ER.  BLE 2x10 each Hip abduction ball squeeze 2x10 Seated hamstring curl with red band 2 x 10 both Ambulation 2x100 ft with SPC and cuing for sequencing; seated recovery period required after each ambulation trial Standing calf stretch  using 2" step 2x20 sec Standing at barre performing high marching x10 reps bilat   09/10/2021: Nustep level 3 x8 min with PT present to discuss status Seated with 3#:  heel/toe raises, marching, LAQ, hip ER.  BLE 2x10 each Hip abduction ball squeeze 2x10 Seated hamstring curl with red band 2 x 10 both Ambulation x100 ft with SPC and cuing for gait pattern and sequencing Supine quad set with slider under heel 2 x 10 bilat Supine heel slide 2 x 10 foot on slider bilat    PATIENT EDUCATION:  Education details: Issued HEP Person educated: Patient Education method: Explanation, Demonstration, and Handouts Education comprehension: verbalized understanding and returned demonstration     HOME EXERCISE PROGRAM: Access Code: [email or web address] URL: [email or web address] Date: 08/02/2021 Prepared by: Juel Burrow   Exercises - Seated Heel Toe Raises  - 2 x daily - 7 x weekly - 2 sets - 10 reps - Seated March  - 2 x daily - 7 x weekly - 2 sets - 10 reps - Seated Long Arc Quad  - 2 x daily - 7 x weekly - 2 sets - 10 reps - Seated Hip Adduction Isometrics with Ball  - 2 x daily - 7 x weekly - 2 sets - 10 reps   ASSESSMENT:   CLINICAL IMPRESSION: Unice is progressing appropriately and reporting overall improvements and goal related activities.  Pt has met FOTO and is progressing overall with improved functional mobility with TUG and 5 times sit to/from stand.  Pt reports that she has been using her cane more in her home and it is helping some.  Pt continues to require skilled PT to progress towards goal related  activities.     OBJECTIVE IMPAIRMENTS decreased balance, difficulty walking, decreased ROM, decreased strength, impaired flexibility, and pain.    ACTIVITY LIMITATIONS standing, squatting, and stairs   PARTICIPATION LIMITATIONS: laundry, community activity, and yard work   PERSONAL FACTORS Age, Time since onset of injury/illness/exacerbation, and 3+ comorbidities: OA, Hx of breast cancer, Hx of L TKA, HTN  are also affecting patient's functional outcome.    REHAB POTENTIAL: Good   CLINICAL DECISION MAKING: Evolving/moderate complexity   EVALUATION COMPLEXITY: Moderate     GOALS: Goals reviewed with patient? Yes   SHORT TERM GOALS: Target date: 08/23/2021  Pt will be independent with initial HEP. Baseline: Goal status: MET   2.  Pt will report at least a 40% improvement in symptoms since initial evaluation. Baseline:  Goal status: Goal Met 09/07/21 (reports 50% improvement)     LONG TERM GOALS: Target date: 09/27/2021    Pt will be independent with advanced HEP. Baseline:  Goal status: Ongoing   2.  Pt will increase FOTO to at least 55% to demonstrate improvements in functional mobility. Baseline: 43% Goal status: MET 09/21/21   3.  Pt will increase right hip/knee strength to at least 4+/5 to allow for easier navigation of steps. Baseline: 4/5 Goal status: ONGOING   4.  Pt will report being able to stand/ambulate at least 20 minutes without increased pain to allow her to complete functional and community activities. Baseline: pain 7/10 with standing Goal status: ONGOING   5.  Pt will increase right knee extension to 0 degrees to allow for improved gait pattern. Baseline:  missing 15 degrees from full extension Goal status:  Ongoing (see above)     PLAN: PT FREQUENCY: 2x/week   PT DURATION: 8 weeks   PLANNED INTERVENTIONS: Therapeutic exercises, Therapeutic activity, Neuromuscular re-education, Balance  training, Gait training, Patient/Family education, Self Care,  Joint mobilization, Joint manipulation, Stair training, DME instructions, Aquatic Therapy, Dry Needling, Electrical stimulation, Cryotherapy, Moist heat, Taping, Ultrasound, Ionotophoresis 31m/ml Dexamethasone, Manual therapy, and Re-evaluation   PLAN FOR NEXT SESSION: progress and assess HEP as indicated, Pt did well with closed chain mobilization with movement during sit to stand, strengthening, flexibility     SJuel Burrow PT 09/21/21 12:33 PM   [address] SMarshfield[place] [address]Phone # [phone] [phone]

## 2021-09-23 ENCOUNTER — Ambulatory Visit: Payer: Medicare PPO

## 2021-09-23 DIAGNOSIS — G8929 Other chronic pain: Secondary | ICD-10-CM | POA: Diagnosis not present

## 2021-09-23 DIAGNOSIS — M25561 Pain in right knee: Secondary | ICD-10-CM | POA: Diagnosis not present

## 2021-09-23 DIAGNOSIS — R2689 Other abnormalities of gait and mobility: Secondary | ICD-10-CM | POA: Diagnosis not present

## 2021-09-23 DIAGNOSIS — M6281 Muscle weakness (generalized): Secondary | ICD-10-CM

## 2021-09-23 NOTE — Therapy (Signed)
OUTPATIENT PHYSICAL THERAPY TREATMENT NOTE   Patient Name: ELVERNA CAFFEE MRN: [id number] DOB:March 23, 1930, 88 y.o., female Today's Date: 09/23/2021   Progress Note Reporting Period 08/02/2021 to 09/21/2021  See note below for Objective Data and Assessment of Progress/Goals.        END OF SESSION:   PT End of Session - 09/23/21 1205     Visit Number 11    Number of Visits 19    Date for PT Re-Evaluation 09/27/21    Authorization Type [employer]    Authorization Time Period requeted 2x/week for 8 additional weeks (ending 11/19/21)    Authorization - Visit Number 11    Authorization - Number of Visits 19    Progress Note Due on Visit 20    PT Start Time 1201    PT Stop Time 1232    PT Time Calculation (min) 31 min    Activity Tolerance Patient tolerated treatment well    Behavior During Therapy WFL for tasks assessed/performed              Past Medical History:  Diagnosis Date   Acute gastric ulcer without mention of hemorrhage, perforation, or obstruction 2002   Anemia    Anxiety    Arthritis    Cancer ([place])    CAP (community acquired pneumonia) 07/04/2019   Colon polyp 2010   HYPERPLASTIC POLYP   Depression    Diverticulosis of colon (without mention of hemorrhage)    GERD (gastroesophageal reflux disease)    H/O hiatal hernia 2002   Hypertension    Hypothyroidism    Neuromuscular disorder ([place])    Stricture and stenosis of esophagus 2002   Tuberculosis    Past Surgical History:  Procedure Laterality Date   BREAST SURGERY     ENTEROSCOPY  11/09/2011   Procedure: ENTEROSCOPY;  Surgeon: Inda Castle, MD;  Location: [facility];  Service: Endoscopy;  Laterality: N/A;   EYE SURGERY     JOINT REPLACEMENT Left    knee   MASTECTOMY     TONSILLECTOMY  under age 53   Patient Active Problem List   Diagnosis Date Noted   Edema of foot 09/21/2020   Routine general medical examination at a health care facility 01/05/2018   B12 deficiency 10/06/2015    ANEMIA, IRON DEFICIENCY 04/22/2008   PEPTIC STRICTURE 04/17/2008   CELIAC DISEASE 09/19/2007   ADENOCARCINOMA, BREAST, RIGHT 03/13/2007   Hypothyroidism 03/13/2007   Hyperlipidemia 03/13/2007   Depression 03/13/2007   PERIPHERAL NEUROPATHY 03/13/2007   Essential hypertension 03/13/2007   GASTROESOPHAGEAL REFLUX DISEASE 03/13/2007   Osteoarthritis 03/13/2007    PCP: Hoyt Koch, MD   REFERRING PROVIDER: Gregor Hams, MD   REFERRING DIAG: [id number] (ICD-10-CM) - Chronic pain of right knee R26.9 (ICD-10-CM) - Abnormality of gait    THERAPY DIAG:  Chronic pain of right knee   Muscle weakness (generalized)   Other abnormalities of gait and mobility   Rationale for Evaluation and Treatment Rehabilitation   ONSET DATE: Overall for a year with last couple of months getting worse   SUBJECTIVE:    SUBJECTIVE STATEMENT: Patient was 15 min late.  Reports "I just couldn't get going this morning".   Ankle swelling is fairly severe this morning.     PERTINENT HISTORY: OA, L knee TKA in 1991, Anemia, Breast Cancer, Tuberculosis exposure at 88 years old   PAIN:  Are you having pain? Yes: NPRS scale: 0-6/10 Pain location: right knee Pain description: dull hurt Aggravating factors:  standing Relieving factors: taking pressure off her knee   PRECAUTIONS: Fall   WEIGHT BEARING RESTRICTIONS No   FALLS:  Has patient fallen in last 6 months?  Pt denies falls, but states that she has "caught herself" about 3 times   LIVING ENVIRONMENT: Lives with:  lives alone with her [place], [place] Lives in: House/apartment Stairs: Yes: External: 2 steps; can reach both Has following equipment at home: Single point cane, shower chair, and Grab bars   OCCUPATION: retired   PLOF: Independent and Leisure: Barista, [place], playing on computer   PATIENT GOALS:  To avoid having right knee surgery and to decrease her pain.     OBJECTIVE:    DIAGNOSTIC FINDINGS:  Right knee  radiograph on 07/29/21: IMPRESSION: 1. Severe medial and patellofemoral compartment osteoarthritis. 2. Small joint effusion.   PATIENT SURVEYS:  Eval:  FOTO 43% (projected 55% by visit 12) 09/21/2021:  56%   COGNITION:           Overall cognitive status: Within functional limits for tasks assessed                          SENSATION: WFL   MUSCLE LENGTH: Right hamstring tightness with pain   POSTURE: rounded shoulders   PALPATION: Tender to palpation along right knee   LOWER EXTREMITY ROM:   Active ROM Right eval Right 09/07/2021 Right 09/21/2021 Left eval  Hip flexion        Hip extension        Hip abduction        Hip adduction        Hip internal rotation        Hip external rotation        Knee flexion 120 with pain 111 In supine 115 120  Knee extension 15 9 in supine 9 0  Ankle dorsiflexion        Ankle plantarflexion        Ankle inversion        Ankle eversion         (Blank rows = not tested)   LOWER EXTREMITY MMT:   R hip/knee strength of 4/5, L hip/knee strength 5/5   LOWER EXTREMITY SPECIAL TESTS:  Knee special tests: Anterior drawer test: negative and Posterior drawer test: negative   FUNCTIONAL TESTS:  Eval: 5 times sit to stand: 18.84 Timed up and go (TUG): 15.9 sec  09/21/2021: 5 times sit to stand: 13.5 sec from PT mat Timed up and go (TUG): 13.5 sec without assistive device    GAIT: Distance walked: 50 ft Assistive device utilized: None Level of assistance: Modified independence Comments: Antalgic gait with decreased weight bearing through RLE.       TODAY'S TREATMENT:  09/23/21: Nustep level 3 x 5 min with PT present to discuss status Seated with 4#:  heel/toe raises, marching, LAQ, hip ER.  BLE 2x10 each Hip abduction ball squeeze 2x10 Seated hamstring curl with red band 2 x 10 both  09/21/2021: Nustep level 3 x8 min with PT present to discuss status FOTO, 5 times sit to/from stand, and TUG (see above) Seated with 3#:   heel/toe raises, marching, LAQ, hip ER.  BLE 2x10 each Hip abduction ball squeeze 2x10 Seated hamstring curl with red band 2 x 10 both Amb 3 x 75 ft with SPC with improved stride length noted during session   09/14/2021: Nustep level 3 x8 min with PT present to discuss status Seated  with 3#:  heel/toe raises, marching, LAQ, hip ER.  BLE 2x10 each Hip abduction ball squeeze 2x10 Seated hamstring curl with red band 2 x 10 both Ambulation 2x100 ft with SPC and cuing for sequencing; seated recovery period required after each ambulation trial Standing calf stretch using 2" step 2x20 sec Standing at barre performing high marching x10 reps bilat   09/10/2021: Nustep level 3 x8 min with PT present to discuss status Seated with 3#:  heel/toe raises, marching, LAQ, hip ER.  BLE 2x10 each Hip abduction ball squeeze 2x10 Seated hamstring curl with red band 2 x 10 both Ambulation x100 ft with SPC and cuing for gait pattern and sequencing Supine quad set with slider under heel 2 x 10 bilat Supine heel slide 2 x 10 foot on slider bilat    PATIENT EDUCATION:  Education details: Issued HEP Person educated: Patient Education method: Explanation, Demonstration, and Handouts Education comprehension: verbalized understanding and returned demonstration     HOME EXERCISE PROGRAM: Access Code: [email or web address] URL: [email or web address] Date: 08/02/2021 Prepared by: Juel Burrow   Exercises - Seated Heel Toe Raises  - 2 x daily - 7 x weekly - 2 sets - 10 reps - Seated March  - 2 x daily - 7 x weekly - 2 sets - 10 reps - Seated Long Arc Quad  - 2 x daily - 7 x weekly - 2 sets - 10 reps - Seated Hip Adduction Isometrics with Ball  - 2 x daily - 7 x weekly - 2 sets - 10 reps   ASSESSMENT:   CLINICAL IMPRESSION: Johniece was late to appt.  Therefore treatment was limited.  We focused on basic seated exercises.  Her ankle swelling was quite pronounced today.  We discussed the preventative merits  of using a cane due to her knee being so painful.  Educated patient on common knee instability issues with end stage OA knees.  She understands and will begin using cane anytime she leaves home.    Pt continues to require skilled PT to progress towards goal related activities.     OBJECTIVE IMPAIRMENTS decreased balance, difficulty walking, decreased ROM, decreased strength, impaired flexibility, and pain.    ACTIVITY LIMITATIONS standing, squatting, and stairs   PARTICIPATION LIMITATIONS: laundry, community activity, and yard work   PERSONAL FACTORS Age, Time since onset of injury/illness/exacerbation, and 3+ comorbidities: OA, Hx of breast cancer, Hx of L TKA, HTN  are also affecting patient's functional outcome.    REHAB POTENTIAL: Good   CLINICAL DECISION MAKING: Evolving/moderate complexity   EVALUATION COMPLEXITY: Moderate     GOALS: Goals reviewed with patient? Yes   SHORT TERM GOALS: Target date: 08/23/2021  Pt will be independent with initial HEP. Baseline: Goal status: MET   2.  Pt will report at least a 40% improvement in symptoms since initial evaluation. Baseline:  Goal status: Goal Met 09/07/21 (reports 50% improvement)     LONG TERM GOALS: Target date: 09/27/2021    Pt will be independent with advanced HEP. Baseline:  Goal status: Ongoing   2.  Pt will increase FOTO to at least 55% to demonstrate improvements in functional mobility. Baseline: 43% Goal status: MET 09/21/21   3.  Pt will increase right hip/knee strength to at least 4+/5 to allow for easier navigation of steps. Baseline: 4/5 Goal status: ONGOING   4.  Pt will report being able to stand/ambulate at least 20 minutes without increased pain to allow her to complete functional and community activities. Baseline:  pain 7/10 with standing Goal status: ONGOING   5.  Pt will increase right knee extension to 0 degrees to allow for improved gait pattern. Baseline:  missing 15 degrees from full  extension Goal status:  Ongoing (see above)     PLAN: PT FREQUENCY:  Continue 2x/week   PT DURATION: for an additional 8 weeks ending 11/19/21   PLANNED INTERVENTIONS: Therapeutic exercises, Therapeutic activity, Neuromuscular re-education, Balance training, Gait training, Patient/Family education, Self Care, Joint mobilization, Joint manipulation, Stair training, DME instructions, Aquatic Therapy, Dry Needling, Electrical stimulation, Cryotherapy, Moist heat, Taping, Ultrasound, Ionotophoresis 73m/ml Dexamethasone, Manual therapy, and Re-evaluation   PLAN FOR NEXT SESSION: progress and assess HEP as indicated, Pt did well with closed chain mobilization with movement during sit to stand, strengthening, flexibility     Yoshimi Sarr B. Lurlene Ronda, PT 09/23/21 12:42 PM   [address] SP[place] [address]Phone # [phone] [phone]

## 2021-09-23 NOTE — Addendum Note (Signed)
Addended by: Dellia Nims on: 09/23/2021 12:23 PM   Modules accepted: Orders

## 2021-09-24 ENCOUNTER — Ambulatory Visit: Payer: Medicare PPO | Admitting: Family Medicine

## 2021-09-24 ENCOUNTER — Ambulatory Visit: Payer: Self-pay

## 2021-09-24 DIAGNOSIS — M1711 Unilateral primary osteoarthritis, right knee: Secondary | ICD-10-CM

## 2021-09-24 DIAGNOSIS — G8929 Other chronic pain: Secondary | ICD-10-CM | POA: Diagnosis not present

## 2021-09-24 DIAGNOSIS — M25561 Pain in right knee: Secondary | ICD-10-CM | POA: Diagnosis not present

## 2021-09-24 MED ORDER — HYALURONAN 30 MG/2ML IX SOSY
30.0000 mg | PREFILLED_SYRINGE | Freq: Once | INTRA_ARTICULAR | Status: AC
  Administered 2021-09-24: 30 mg via INTRA_ARTICULAR

## 2021-09-24 NOTE — Progress Notes (Signed)
Daisy Bryant presents to clinic today for Orthovisc injection right knee 2/3  Procedure: Real-time Ultrasound Guided Injection of right knee superior lateral patellar space Device: Philips Affiniti 50G Images permanently stored and available for review in PACS Verbal informed consent obtained.  Discussed risks and benefits of procedure. Warned about infection, bleeding, damage to structures among others. Patient expresses understanding and agreement Time-out conducted.   Noted no overlying erythema, induration, or other signs of local infection.   Skin prepped in a sterile fashion.   Local anesthesia: Topical Ethyl chloride.   With sterile technique and under real time ultrasound guidance: Orthovisc 30 mg injected into knee joint. Fluid seen entering the joint capsule.   Completed without difficulty   Spinal needle used Advised to call if fevers/chills, erythema, induration, drainage, or persistent bleeding.   Images permanently stored and available for review in the ultrasound unit.  Impression: Technically successful ultrasound guided injection.   Lot number: [id number]  Return in 1 week for Orthovisc injection right knee 3/3

## 2021-09-24 NOTE — Patient Instructions (Signed)
Thank you for coming in today.   You received an injection today. Seek immediate medical attention if the joint becomes red, extremely painful, or is oozing fluid.   We will see you next week for the 3rd Orthovisc injection. 

## 2021-09-29 ENCOUNTER — Ambulatory Visit: Payer: Medicare PPO

## 2021-10-01 ENCOUNTER — Ambulatory Visit: Payer: Self-pay

## 2021-10-01 ENCOUNTER — Ambulatory Visit (INDEPENDENT_AMBULATORY_CARE_PROVIDER_SITE_OTHER): Payer: Medicare PPO | Admitting: Family Medicine

## 2021-10-01 DIAGNOSIS — M1711 Unilateral primary osteoarthritis, right knee: Secondary | ICD-10-CM

## 2021-10-01 DIAGNOSIS — G8929 Other chronic pain: Secondary | ICD-10-CM | POA: Diagnosis not present

## 2021-10-01 MED ORDER — HYALURONAN 30 MG/2ML IX SOSY
30.0000 mg | PREFILLED_SYRINGE | Freq: Once | INTRA_ARTICULAR | Status: AC
  Administered 2021-10-01: 30 mg via INTRA_ARTICULAR

## 2021-10-01 NOTE — Patient Instructions (Signed)
Thank you for coming in today.   You received an injection today. Seek immediate medical attention if the joint becomes red, extremely painful, or is oozing fluid.   You completed the Orthovisc series in your knee.  Check back as needed

## 2021-10-01 NOTE — Progress Notes (Signed)
Daisy Bryant presents to clinic today for Orthovisc injection right knee 3/3  Procedure: Real-time Ultrasound Guided Injection of right knee superior lateral patellar space Device: Philips Affiniti 50G Images permanently stored and available for review in PACS Verbal informed consent obtained.  Discussed risks and benefits of procedure. Warned about infection, bleeding, damage to structures among others. Patient expresses understanding and agreement Time-out conducted.   Noted no overlying erythema, induration, or other signs of local infection.   Skin prepped in a sterile fashion.   Local anesthesia: Topical Ethyl chloride.   With sterile technique and under real time ultrasound guidance: Orthovisc 30 mg injected into knee joint. Fluid seen entering the joint capsule.   Completed without difficulty   Advised to call if fevers/chills, erythema, induration, drainage, or persistent bleeding.   Images permanently stored and available for review in the ultrasound unit.  Impression: Technically successful ultrasound guided injection.  Lot number: [id number] Return as needed

## 2021-10-06 ENCOUNTER — Ambulatory Visit: Payer: Medicare PPO | Attending: Family Medicine | Admitting: Rehabilitative and Restorative Service Providers"

## 2021-10-06 ENCOUNTER — Encounter: Payer: Self-pay | Admitting: Rehabilitative and Restorative Service Providers"

## 2021-10-06 DIAGNOSIS — R2689 Other abnormalities of gait and mobility: Secondary | ICD-10-CM | POA: Diagnosis not present

## 2021-10-06 DIAGNOSIS — M6281 Muscle weakness (generalized): Secondary | ICD-10-CM

## 2021-10-06 DIAGNOSIS — R252 Cramp and spasm: Secondary | ICD-10-CM | POA: Insufficient documentation

## 2021-10-06 DIAGNOSIS — R262 Difficulty in walking, not elsewhere classified: Secondary | ICD-10-CM | POA: Insufficient documentation

## 2021-10-06 DIAGNOSIS — G8929 Other chronic pain: Secondary | ICD-10-CM | POA: Diagnosis not present

## 2021-10-06 DIAGNOSIS — M25561 Pain in right knee: Secondary | ICD-10-CM | POA: Insufficient documentation

## 2021-10-06 NOTE — Therapy (Signed)
OUTPATIENT PHYSICAL THERAPY TREATMENT NOTE   Patient Name: Daisy Bryant MRN: [id number] DOB:[birth year], 88 y.o., female Today's Date: 10/06/2021   Progress Note Reporting Period 08/02/2021 to 09/21/2021  See note below for Objective Data and Assessment of Progress/Goals.        END OF SESSION:   PT End of Session - 10/06/21 1146     Visit Number 12    Date for PT Re-Evaluation 11/19/21    Authorization Type [employer]    Authorization Time Period 09/27/21-11/19/2021    Authorization - Visit Number 1    Authorization - Number of Visits 16    Progress Note Due on Visit 79    PT Start Time 2263    PT Stop Time 1225    PT Time Calculation (min) 40 min    Activity Tolerance Patient tolerated treatment well    Behavior During Therapy WFL for tasks assessed/performed              Past Medical History:  Diagnosis Date   Acute gastric ulcer without mention of hemorrhage, perforation, or obstruction 2002   Anemia    Anxiety    Arthritis    Cancer ([place])    CAP (community acquired pneumonia) 07/04/2019   Colon polyp 2010   HYPERPLASTIC POLYP   Depression    Diverticulosis of colon (without mention of hemorrhage)    GERD (gastroesophageal reflux disease)    H/O hiatal hernia 2002   Hypertension    Hypothyroidism    Neuromuscular disorder ([place])    Stricture and stenosis of esophagus 2002   Tuberculosis    Past Surgical History:  Procedure Laterality Date   BREAST SURGERY     ENTEROSCOPY  11/09/2011   Procedure: ENTEROSCOPY;  Surgeon: Inda Castle, MD;  Location: [facility];  Service: Endoscopy;  Laterality: N/A;   EYE SURGERY     JOINT REPLACEMENT Left    knee   MASTECTOMY     TONSILLECTOMY  under age 68   Patient Active Problem List   Diagnosis Date Noted   Edema of foot 09/21/2020   Routine general medical examination at a health care facility 01/05/2018   B12 deficiency 10/06/2015   ANEMIA, IRON DEFICIENCY 04/22/2008   PEPTIC STRICTURE  04/17/2008   CELIAC DISEASE 09/19/2007   ADENOCARCINOMA, BREAST, RIGHT 03/13/2007   Hypothyroidism 03/13/2007   Hyperlipidemia 03/13/2007   Depression 03/13/2007   PERIPHERAL NEUROPATHY 03/13/2007   Essential hypertension 03/13/2007   GASTROESOPHAGEAL REFLUX DISEASE 03/13/2007   Osteoarthritis 03/13/2007    PCP: Hoyt Koch, MD   REFERRING PROVIDER: Gregor Hams, MD   REFERRING DIAG: [phone] (ICD-10-CM) - Chronic pain of right knee R26.9 (ICD-10-CM) - Abnormality of gait    THERAPY DIAG:  Chronic pain of right knee   Muscle weakness (generalized)   Other abnormalities of gait and mobility   Rationale for Evaluation and Treatment Rehabilitation   ONSET DATE: Overall for a year with last couple of months getting worse   SUBJECTIVE:    SUBJECTIVE STATEMENT: Patient states that she got a right knee injection from Dr Georgina Snell and while she still has pain, she is says her her pain is slightly less.    PERTINENT HISTORY: OA, L knee TKA in 1991, Anemia, Breast Cancer, Tuberculosis exposure at 88 years old   PAIN:  Are you having pain? Yes: NPRS scale: 5/10 Pain location: right knee Pain description: dull hurt Aggravating factors: standing Relieving factors: taking pressure off her knee   PRECAUTIONS:  Fall   WEIGHT BEARING RESTRICTIONS No   FALLS:  Has patient fallen in last 6 months?  Pt denies falls, but states that she has "caught herself" about 3 times   LIVING ENVIRONMENT: Lives with:  lives alone with her [place], [place] Lives in: House/apartment Stairs: Yes: External: 2 steps; can reach both Has following equipment at home: Single point cane, shower chair, and Grab bars   OCCUPATION: retired   PLOF: Independent and Leisure: Barista, [place], playing on computer   PATIENT GOALS:  To avoid having right knee surgery and to decrease her pain.     OBJECTIVE:    DIAGNOSTIC FINDINGS:  Right knee radiograph on 07/29/21: IMPRESSION: 1. Severe  medial and patellofemoral compartment osteoarthritis. 2. Small joint effusion.   PATIENT SURVEYS:  Eval:  FOTO 43% (projected 55% by visit 12) 09/21/2021:  56%   COGNITION:           Overall cognitive status: Within functional limits for tasks assessed                          SENSATION: WFL   MUSCLE LENGTH: Right hamstring tightness with pain   POSTURE: rounded shoulders   PALPATION: Tender to palpation along right knee   LOWER EXTREMITY ROM:   Active ROM Right eval Right 09/07/2021 Right 09/21/2021 Left eval  Hip flexion        Hip extension        Hip abduction        Hip adduction        Hip internal rotation        Hip external rotation        Knee flexion 120 with pain 111 In supine 115 120  Knee extension 15 9 in supine 9 0  Ankle dorsiflexion        Ankle plantarflexion        Ankle inversion        Ankle eversion         (Blank rows = not tested)   LOWER EXTREMITY MMT:   R hip/knee strength of 4/5, L hip/knee strength 5/5   LOWER EXTREMITY SPECIAL TESTS:  Knee special tests: Anterior drawer test: negative and Posterior drawer test: negative   FUNCTIONAL TESTS:  Eval: 5 times sit to stand: 18.84 Timed up and go (TUG): 15.9 sec  09/21/2021: 5 times sit to stand: 13.5 sec from PT mat Timed up and go (TUG): 13.5 sec without assistive device    GAIT: Distance walked: 50 ft Assistive device utilized: None Level of assistance: Modified independence Comments: Antalgic gait with decreased weight bearing through RLE.       TODAY'S TREATMENT:  10/06/2021: Seated with 4#:  heel/toe raises, marching, LAQ, hip ER.  BLE 2x10 each Nustep level 3 x 6 min with PT present to discuss status Hip abduction ball squeeze 2x10 Seated hamstring curl with red band 2 x 10 both Supine:  SLR, heel slide, hip abduction.  BLE 2x10 each   09/23/21: Nustep level 3 x 5 min with PT present to discuss status Seated with 4#:  heel/toe raises, marching, LAQ, hip ER.  BLE  2x10 each Hip abduction ball squeeze 2x10 Seated hamstring curl with red band 2 x 10 both   09/21/2021: Nustep level 3 x8 min with PT present to discuss status FOTO, 5 times sit to/from stand, and TUG (see above) Seated with 3#:  heel/toe raises, marching, LAQ, hip ER.  BLE 2x10  each Hip abduction ball squeeze 2x10 Seated hamstring curl with red band 2 x 10 both Amb 3 x 75 ft with SPC with improved stride length noted during session     PATIENT EDUCATION:  Education details: Issued HEP Person educated: Patient Education method: Explanation, Demonstration, and Handouts Education comprehension: verbalized understanding and returned demonstration     HOME EXERCISE PROGRAM: Access Code: [email or web address] URL: [email or web address] Date: 08/02/2021 Prepared by: Juel Burrow   Exercises - Seated Heel Toe Raises  - 2 x daily - 7 x weekly - 2 sets - 10 reps - Seated March  - 2 x daily - 7 x weekly - 2 sets - 10 reps - Seated Long Arc Quad  - 2 x daily - 7 x weekly - 2 sets - 10 reps - Seated Hip Adduction Isometrics with Ball  - 2 x daily - 7 x weekly - 2 sets - 10 reps   ASSESSMENT:   CLINICAL IMPRESSION: Ms Splinter presents to skilled PT today following Orthovisc injection 2/2 on 09/24/21 and 3/3 on 10/01/2021.  Pt reports that she is still having some improvements, but still having pain.  Pt able to progress with exercises and states that she can feel her muscle working.  Pt continues to progress towards goal related activities and able to perform supine straight leg raise without assistance.  Pt continues to require skilled PT to progress towards goal related activities.     OBJECTIVE IMPAIRMENTS decreased balance, difficulty walking, decreased ROM, decreased strength, impaired flexibility, and pain.    ACTIVITY LIMITATIONS standing, squatting, and stairs   PARTICIPATION LIMITATIONS: laundry, community activity, and yard work   PERSONAL FACTORS Age, Time since onset of  injury/illness/exacerbation, and 3+ comorbidities: OA, Hx of breast cancer, Hx of L TKA, HTN  are also affecting patient's functional outcome.    REHAB POTENTIAL: Good   CLINICAL DECISION MAKING: Evolving/moderate complexity   EVALUATION COMPLEXITY: Moderate     GOALS: Goals reviewed with patient? Yes   SHORT TERM GOALS: Target date: 08/23/2021  Pt will be independent with initial HEP. Baseline: Goal status: MET   2.  Pt will report at least a 40% improvement in symptoms since initial evaluation. Baseline:  Goal status: Goal Met 09/07/21 (reports 50% improvement)     LONG TERM GOALS: Target date: 09/27/2021    Pt will be independent with advanced HEP. Baseline:  Goal status: Ongoing   2.  Pt will increase FOTO to at least 55% to demonstrate improvements in functional mobility. Baseline: 43% Goal status: MET 09/21/21   3.  Pt will increase right hip/knee strength to at least 4+/5 to allow for easier navigation of steps. Baseline: 4/5 Goal status: ONGOING   4.  Pt will report being able to stand/ambulate at least 20 minutes without increased pain to allow her to complete functional and community activities. Baseline: pain 7/10 with standing Goal status: ONGOING   5.  Pt will increase right knee extension to 0 degrees to allow for improved gait pattern. Baseline:  missing 15 degrees from full extension Goal status:  Ongoing (see above)     PLAN: PT FREQUENCY:  Continue 2x/week   PT DURATION: for an additional 8 weeks ending 11/19/21   PLANNED INTERVENTIONS: Therapeutic exercises, Therapeutic activity, Neuromuscular re-education, Balance training, Gait training, Patient/Family education, Self Care, Joint mobilization, Joint manipulation, Stair training, DME instructions, Aquatic Therapy, Dry Needling, Electrical stimulation, Cryotherapy, Moist heat, Taping, Ultrasound, Ionotophoresis 59m/ml Dexamethasone, Manual therapy, and Re-evaluation   PLAN FOR NEXT  SESSION: progress  and assess HEP as indicated, Pt did well with closed chain mobilization with movement during sit to stand, strengthening, flexibility     Juel Burrow, PT 10/06/21 12:27 PM   [facility] [address] [place], [place] [address] Phone # [phone] Fax [phone]

## 2021-10-13 ENCOUNTER — Ambulatory Visit: Payer: Medicare PPO

## 2021-10-19 ENCOUNTER — Ambulatory Visit: Payer: Medicare PPO

## 2021-10-19 DIAGNOSIS — G8929 Other chronic pain: Secondary | ICD-10-CM | POA: Diagnosis not present

## 2021-10-19 DIAGNOSIS — R262 Difficulty in walking, not elsewhere classified: Secondary | ICD-10-CM

## 2021-10-19 DIAGNOSIS — R2689 Other abnormalities of gait and mobility: Secondary | ICD-10-CM | POA: Diagnosis not present

## 2021-10-19 DIAGNOSIS — M6281 Muscle weakness (generalized): Secondary | ICD-10-CM

## 2021-10-19 DIAGNOSIS — R252 Cramp and spasm: Secondary | ICD-10-CM

## 2021-10-19 DIAGNOSIS — M25561 Pain in right knee: Secondary | ICD-10-CM | POA: Diagnosis not present

## 2021-10-19 NOTE — Therapy (Signed)
OUTPATIENT PHYSICAL THERAPY TREATMENT NOTE   Patient Name: Daisy Bryant MRN: [id number] DOB:09-05-1930, 88 y.o., female Today's Date: 10/19/2021    END OF SESSION:   PT End of Session - 10/19/21 1153     Visit Number 13    Number of Visits 19    Date for PT Re-Evaluation 11/19/21    Authorization Type [employer]    Authorization Time Period 09/27/21-11/19/2021    Authorization - Visit Number 71    Authorization - Number of Visits 16    Progress Note Due on Visit 20    PT Start Time 1150    PT Stop Time 1230    PT Time Calculation (min) 40 min    Activity Tolerance Patient tolerated treatment well    Behavior During Therapy [facility] for tasks assessed/performed              Past Medical History:  Diagnosis Date   Acute gastric ulcer without mention of hemorrhage, perforation, or obstruction 2002   Anemia    Anxiety    Arthritis    Cancer ([place])    CAP (community acquired pneumonia) 07/04/2019   Colon polyp 2010   HYPERPLASTIC POLYP   Depression    Diverticulosis of colon (without mention of hemorrhage)    GERD (gastroesophageal reflux disease)    H/O hiatal hernia 2002   Hypertension    Hypothyroidism    Neuromuscular disorder ([place])    Stricture and stenosis of esophagus 2002   Tuberculosis    Past Surgical History:  Procedure Laterality Date   BREAST SURGERY     ENTEROSCOPY  11/09/2011   Procedure: ENTEROSCOPY;  Surgeon: Inda Castle, MD;  Location: [facility];  Service: Endoscopy;  Laterality: N/A;   EYE SURGERY     JOINT REPLACEMENT Left    knee   MASTECTOMY     TONSILLECTOMY  under age 4   Patient Active Problem List   Diagnosis Date Noted   Edema of foot 09/21/2020   Routine general medical examination at a health care facility 01/05/2018   B12 deficiency 10/06/2015   ANEMIA, IRON DEFICIENCY 04/22/2008   PEPTIC STRICTURE 04/17/2008   CELIAC DISEASE 09/19/2007   ADENOCARCINOMA, BREAST, RIGHT 03/13/2007   Hypothyroidism 03/13/2007    Hyperlipidemia 03/13/2007   Depression 03/13/2007   PERIPHERAL NEUROPATHY 03/13/2007   Essential hypertension 03/13/2007   GASTROESOPHAGEAL REFLUX DISEASE 03/13/2007   Osteoarthritis 03/13/2007    PCP: Hoyt Koch, MD   REFERRING PROVIDER: Gregor Hams, MD   REFERRING DIAG: [phone] (ICD-10-CM) - Chronic pain of right knee R26.9 (ICD-10-CM) - Abnormality of gait    THERAPY DIAG:  Chronic pain of right knee   Muscle weakness (generalized)   Other abnormalities of gait and mobility   Rationale for Evaluation and Treatment Rehabilitation   ONSET DATE: Overall for a year with last couple of months getting worse   SUBJECTIVE:    SUBJECTIVE STATEMENT: Patient states she was not feeling well last week.  She admits she has fallen in the yard a couple of times in the past few weeks.  She did not have her cane.  She has decided she should not go outside without her cane any longer.      PERTINENT HISTORY: OA, L knee TKA in 1991, Anemia, Breast Cancer, Tuberculosis exposure at 88 years old   PAIN:  Are you having pain? Yes: NPRS scale: 5/10 Pain location: right knee Pain description: dull hurt Aggravating factors: standing Relieving factors: taking pressure  off her knee   PRECAUTIONS: Fall   WEIGHT BEARING RESTRICTIONS No   FALLS:  Has patient fallen in last 6 months?  Pt denies falls, but states that she has "caught herself" about 3 times   LIVING ENVIRONMENT: Lives with:  lives alone with her [place], [place] Lives in: House/apartment Stairs: Yes: External: 2 steps; can reach both Has following equipment at home: Single point cane, shower chair, and Grab bars   OCCUPATION: retired   PLOF: Independent and Leisure: Barista, [place], playing on computer   PATIENT GOALS:  To avoid having right knee surgery and to decrease her pain.     OBJECTIVE:    DIAGNOSTIC FINDINGS:  Right knee radiograph on 07/29/21: IMPRESSION: 1. Severe medial and  patellofemoral compartment osteoarthritis. 2. Small joint effusion.   PATIENT SURVEYS:  Eval:  FOTO 43% (projected 55% by visit 12) 09/21/2021:  56%   COGNITION:           Overall cognitive status: Within functional limits for tasks assessed                          SENSATION: WFL   MUSCLE LENGTH: Right hamstring tightness with pain   POSTURE: rounded shoulders   PALPATION: Tender to palpation along right knee   LOWER EXTREMITY ROM:   Active ROM Right eval Right 09/07/2021 Right 09/21/2021 Left eval  Hip flexion        Hip extension        Hip abduction        Hip adduction        Hip internal rotation        Hip external rotation        Knee flexion 120 with pain 111 In supine 115 120  Knee extension 15 9 in supine 9 0  Ankle dorsiflexion        Ankle plantarflexion        Ankle inversion        Ankle eversion         (Blank rows = not tested)   LOWER EXTREMITY MMT:   R hip/knee strength of 4/5, L hip/knee strength 5/5   LOWER EXTREMITY SPECIAL TESTS:  Knee special tests: Anterior drawer test: negative and Posterior drawer test: negative   FUNCTIONAL TESTS:  Eval: 5 times sit to stand: 18.84 Timed up and go (TUG): 15.9 sec  09/21/2021: 5 times sit to stand: 13.5 sec from PT mat Timed up and go (TUG): 13.5 sec without assistive device    GAIT: Distance walked: 50 ft Assistive device utilized: None Level of assistance: Modified independence Comments: Antalgic gait with decreased weight bearing through RLE.       TODAY'S TREATMENT:  10/19/2021: Nustep level 3 x 6 min with PT present to discuss status Seated with 4#:  heel/toe raises, marching, LAQ, hip ER.  BLE 2x10 each Seated hamstring curl with red band 2 x 10 both Clamshell with green loop 2 x 10 Hip adduction ball squeeze 2x10 Supine:  Quad set, TKE with small green noodle, SAQ, SLR,  BLE 2x10 each Lengthy discussion about proper a.d. due to the level of degeneration of her right knee and its  susceptibility to buckle.  She does not want to do TKA.  Suggested rollator walker.    10/06/2021: Seated with 4#:  heel/toe raises, marching, LAQ, hip ER.  BLE 2x10 each Nustep level 3 x 6 min with PT present to discuss status Hip abduction  ball squeeze 2x10 Seated hamstring curl with red band 2 x 10 both Supine:  SLR, heel slide, hip abduction.  BLE 2x10 each   09/23/21: Nustep level 3 x 5 min with PT present to discuss status Seated with 4#:  heel/toe raises, marching, LAQ, hip ER.  BLE 2x10 each Hip abduction ball squeeze 2x10 Seated hamstring curl with red band 2 x 10 both    PATIENT EDUCATION:  Education details: Issued HEP Person educated: Patient Education method: Explanation, Demonstration, and Handouts Education comprehension: verbalized understanding and returned demonstration     HOME EXERCISE PROGRAM: Access Code: [email or web address] URL: [email or web address] Date: 08/02/2021 Prepared by: Juel Burrow   Exercises - Seated Heel Toe Raises  - 2 x daily - 7 x weekly - 2 sets - 10 reps - Seated March  - 2 x daily - 7 x weekly - 2 sets - 10 reps - Seated Long Arc Quad  - 2 x daily - 7 x weekly - 2 sets - 10 reps - Seated Hip Adduction Isometrics with Ball  - 2 x daily - 7 x weekly - 2 sets - 10 reps   ASSESSMENT:   CLINICAL IMPRESSION: Ms Gambrell has end stage right knee OA.  She is quite functional but safety awareness is limited.  She fell in her yard last week.  Was not using her cane.  She is unable to achieve full extension despite instructions to prop for extension at home.  She has good quad activity in available range.  When walking with a walker today, she demonstrates less antalgic gait with increased step length and improved gait speed.  She would benefit from transitioning to rollator walker when leaving home for safety and improved gait.     OBJECTIVE IMPAIRMENTS decreased balance, difficulty walking, decreased ROM, decreased strength, impaired  flexibility, and pain.    ACTIVITY LIMITATIONS standing, squatting, and stairs   PARTICIPATION LIMITATIONS: laundry, community activity, and yard work   PERSONAL FACTORS Age, Time since onset of injury/illness/exacerbation, and 3+ comorbidities: OA, Hx of breast cancer, Hx of L TKA, HTN  are also affecting patient's functional outcome.    REHAB POTENTIAL: Good   CLINICAL DECISION MAKING: Evolving/moderate complexity   EVALUATION COMPLEXITY: Moderate     GOALS: Goals reviewed with patient? Yes   SHORT TERM GOALS: Target date: 08/23/2021  Pt will be independent with initial HEP. Baseline: Goal status: MET   2.  Pt will report at least a 40% improvement in symptoms since initial evaluation. Baseline:  Goal status: Goal Met 09/07/21 (reports 50% improvement)     LONG TERM GOALS: Target date: 09/27/2021    Pt will be independent with advanced HEP. Baseline:  Goal status: Ongoing   2.  Pt will increase FOTO to at least 55% to demonstrate improvements in functional mobility. Baseline: 43% Goal status: MET 09/21/21   3.  Pt will increase right hip/knee strength to at least 4+/5 to allow for easier navigation of steps. Baseline: 4/5 Goal status: ONGOING   4.  Pt will report being able to stand/ambulate at least 20 minutes without increased pain to allow her to complete functional and community activities. Baseline: pain 7/10 with standing Goal status: ONGOING   5.  Pt will increase right knee extension to 0 degrees to allow for improved gait pattern. Baseline:  missing 15 degrees from full extension Goal status:  Ongoing (see above)     PLAN: PT FREQUENCY:  Continue 2x/week   PT DURATION: for an additional 8  weeks ending 11/19/21   PLANNED INTERVENTIONS: Therapeutic exercises, Therapeutic activity, Neuromuscular re-education, Balance training, Gait training, Patient/Family education, Self Care, Joint mobilization, Joint manipulation, Stair training, DME instructions, Aquatic  Therapy, Dry Needling, Electrical stimulation, Cryotherapy, Moist heat, Taping, Ultrasound, Ionotophoresis 58m/ml Dexamethasone, Manual therapy, and Re-evaluation   PLAN FOR NEXT SESSION: progress HEP as indicated, inquire as to whether patient obtained rollator walker.      JAnderson MaltaB. Kamare Caspers, PT 10/19/21 4:17 PM   [facility] [address]idge ManorGM[place] [address]Phone # [phone] [phone]

## 2021-10-21 ENCOUNTER — Ambulatory Visit: Payer: Medicare PPO

## 2021-10-21 DIAGNOSIS — R252 Cramp and spasm: Secondary | ICD-10-CM

## 2021-10-21 DIAGNOSIS — R2689 Other abnormalities of gait and mobility: Secondary | ICD-10-CM

## 2021-10-21 DIAGNOSIS — G8929 Other chronic pain: Secondary | ICD-10-CM | POA: Diagnosis not present

## 2021-10-21 DIAGNOSIS — M6281 Muscle weakness (generalized): Secondary | ICD-10-CM | POA: Diagnosis not present

## 2021-10-21 DIAGNOSIS — M25561 Pain in right knee: Secondary | ICD-10-CM | POA: Diagnosis not present

## 2021-10-21 DIAGNOSIS — R262 Difficulty in walking, not elsewhere classified: Secondary | ICD-10-CM

## 2021-10-21 NOTE — Therapy (Signed)
OUTPATIENT PHYSICAL THERAPY TREATMENT NOTE   Patient Name: Daisy Bryant MRN: [id number] DOB:10-Jun-1930, 88 y.o., female Today's Date: 10/21/2021    END OF SESSION:   PT End of Session - 10/21/21 1219     Visit Number 14    Number of Visits 19    Date for PT Re-Evaluation 11/19/21    Authorization Type [employer]    Authorization Time Period 09/27/21-11/19/2021    Authorization - Visit Number 80    Authorization - Number of Visits 16    Progress Note Due on Visit 20    PT Start Time 1150    PT Stop Time 1230    PT Time Calculation (min) 40 min    Activity Tolerance Patient tolerated treatment well    Behavior During Therapy [facility] for tasks assessed/performed              Past Medical History:  Diagnosis Date   Acute gastric ulcer without mention of hemorrhage, perforation, or obstruction 2002   Anemia    Anxiety    Arthritis    Cancer ([place])    CAP (community acquired pneumonia) 07/04/2019   Colon polyp 2010   HYPERPLASTIC POLYP   Depression    Diverticulosis of colon (without mention of hemorrhage)    GERD (gastroesophageal reflux disease)    H/O hiatal hernia 2002   Hypertension    Hypothyroidism    Neuromuscular disorder ([place])    Stricture and stenosis of esophagus 2002   Tuberculosis    Past Surgical History:  Procedure Laterality Date   BREAST SURGERY     ENTEROSCOPY  11/09/2011   Procedure: ENTEROSCOPY;  Surgeon: Inda Castle, MD;  Location: [facility];  Service: Endoscopy;  Laterality: N/A;   EYE SURGERY     JOINT REPLACEMENT Left    knee   MASTECTOMY     TONSILLECTOMY  under age 54   Patient Active Problem List   Diagnosis Date Noted   Edema of foot 09/21/2020   Routine general medical examination at a health care facility 01/05/2018   B12 deficiency 10/06/2015   ANEMIA, IRON DEFICIENCY 04/22/2008   PEPTIC STRICTURE 04/17/2008   CELIAC DISEASE 09/19/2007   ADENOCARCINOMA, BREAST, RIGHT 03/13/2007   Hypothyroidism 03/13/2007    Hyperlipidemia 03/13/2007   Depression 03/13/2007   PERIPHERAL NEUROPATHY 03/13/2007   Essential hypertension 03/13/2007   GASTROESOPHAGEAL REFLUX DISEASE 03/13/2007   Osteoarthritis 03/13/2007    PCP: Hoyt Koch, MD   REFERRING PROVIDER: Gregor Hams, MD   REFERRING DIAG: [phone] (ICD-10-CM) - Chronic pain of right knee R26.9 (ICD-10-CM) - Abnormality of gait    THERAPY DIAG:  Chronic pain of right knee   Muscle weakness (generalized)   Other abnormalities of gait and mobility   Rationale for Evaluation and Treatment Rehabilitation   ONSET DATE: Overall for a year with last couple of months getting worse   SUBJECTIVE:    SUBJECTIVE STATEMENT: Patient states she is having more right knee pain today.       PERTINENT HISTORY: OA, L knee TKA in 1991, Anemia, Breast Cancer, Tuberculosis exposure at 88 years old   PAIN:  Are you having pain? Yes: NPRS scale: 5/10 Pain location: right knee Pain description: dull hurt Aggravating factors: standing Relieving factors: taking pressure off her knee   PRECAUTIONS: Fall   WEIGHT BEARING RESTRICTIONS No   FALLS:  Has patient fallen in last 6 months?  Pt denies falls, but states that she has "caught herself" about 3  times   LIVING ENVIRONMENT: Lives with:  lives alone with her [place], Alison Murray Lives in: House/apartment Stairs: Yes: External: 2 steps; can reach both Has following equipment at home: Single point cane, shower chair, and Grab bars   OCCUPATION: retired   PLOF: Independent and Leisure: Barista, quilting, playing on computer   PATIENT GOALS:  To avoid having right knee surgery and to decrease her pain.     OBJECTIVE:    DIAGNOSTIC FINDINGS:  Right knee radiograph on 07/29/21: IMPRESSION: 1. Severe medial and patellofemoral compartment osteoarthritis. 2. Small joint effusion.   PATIENT SURVEYS:  Eval:  FOTO 43% (projected 55% by visit 12) 09/21/2021:  56%   COGNITION:            Overall cognitive status: Within functional limits for tasks assessed                          SENSATION: WFL   MUSCLE LENGTH: Right hamstring tightness with pain   POSTURE: rounded shoulders   PALPATION: Tender to palpation along right knee   LOWER EXTREMITY ROM:   Active ROM Right eval Right 09/07/2021 Right 09/21/2021 Left eval  Hip flexion        Hip extension        Hip abduction        Hip adduction        Hip internal rotation        Hip external rotation        Knee flexion 120 with pain 111 In supine 115 120  Knee extension 15 9 in supine 9 0  Ankle dorsiflexion        Ankle plantarflexion        Ankle inversion        Ankle eversion         (Blank rows = not tested)   LOWER EXTREMITY MMT:   R hip/knee strength of 4/5, L hip/knee strength 5/5   LOWER EXTREMITY SPECIAL TESTS:  Knee special tests: Anterior drawer test: negative and Posterior drawer test: negative   FUNCTIONAL TESTS:  Eval: 5 times sit to stand: 18.84 Timed up and go (TUG): 15.9 sec  09/21/2021: 5 times sit to stand: 13.5 sec from PT mat Timed up and go (TUG): 13.5 sec without assistive device    GAIT: Distance walked: 50 ft Assistive device utilized: None Level of assistance: Modified independence Comments: Antalgic gait with decreased weight bearing through RLE.       TODAY'S TREATMENT: 10/21/2021: Patient had questions about rollator walker: she had looked at some in the store but wasn't sure about weight, wheel size, width:  searched images and made suggestions about wheel size, width and weight limits for heavy duty vs normal weight sized rollators.  Printed out pictures of 2 walkers that would be appropriate for her.   Nustep level 3 x 6 min with PT present to discuss status Seated with 4#:  heel/toe raises, marching, LAQ, hip ER.  BLE 2x10 each Seated hamstring curl with red band 2 x 10 both Clamshell with green loop 2 x 10 Hip adduction ball squeeze 2x10     10/19/2021: Nustep level 3 x 6 min with PT present to discuss status Seated with 4#:  heel/toe raises, marching, LAQ, hip ER.  BLE 2x10 each Seated hamstring curl with red band 2 x 10 both Clamshell with green loop 2 x 10 Hip adduction ball squeeze 2x10 Supine:  Quad set, TKE with small  green noodle, SAQ, SLR,  BLE 2x10 each Lengthy discussion about proper a.d. due to the level of degeneration of her right knee and its susceptibility to buckle.  She does not want to do TKA.  Suggested rollator walker.    10/06/2021: Seated with 4#:  heel/toe raises, marching, LAQ, hip ER.  BLE 2x10 each Nustep level 3 x 6 min with PT present to discuss status Hip abduction ball squeeze 2x10 Seated hamstring curl with red band 2 x 10 both Supine:  SLR, heel slide, hip abduction.  BLE 2x10 each   PATIENT EDUCATION:  Education details: Issued HEP Person educated: Patient Education method: Explanation, Demonstration, and Handouts Education comprehension: verbalized understanding and returned demonstration     HOME EXERCISE PROGRAM: Access Code: [email or web address] URL: [email or web address] Date: 08/02/2021 Prepared by: Juel Burrow   Exercises - Seated Heel Toe Raises  - 2 x daily - 7 x weekly - 2 sets - 10 reps - Seated March  - 2 x daily - 7 x weekly - 2 sets - 10 reps - Seated Long Arc Quad  - 2 x daily - 7 x weekly - 2 sets - 10 reps - Seated Hip Adduction Isometrics with Ball  - 2 x daily - 7 x weekly - 2 sets - 10 reps   ASSESSMENT:   CLINICAL IMPRESSION: Ms Crossland has end stage right knee OA.  She is having increased pain today and bilateral LE edema appears more pronounced today.  She was able to complete all exercises and return understanding of correct assistive device to order.   She would benefit from transitioning to rollator walker when leaving home for safety and improved gait.     OBJECTIVE IMPAIRMENTS decreased balance, difficulty walking, decreased ROM, decreased  strength, impaired flexibility, and pain.    ACTIVITY LIMITATIONS standing, squatting, and stairs   PARTICIPATION LIMITATIONS: laundry, community activity, and yard work   PERSONAL FACTORS Age, Time since onset of injury/illness/exacerbation, and 3+ comorbidities: OA, Hx of breast cancer, Hx of L TKA, HTN  are also affecting patient's functional outcome.    REHAB POTENTIAL: Good   CLINICAL DECISION MAKING: Evolving/moderate complexity   EVALUATION COMPLEXITY: Moderate     GOALS: Goals reviewed with patient? Yes   SHORT TERM GOALS: Target date: 08/23/2021  Pt will be independent with initial HEP. Baseline: Goal status: MET   2.  Pt will report at least a 40% improvement in symptoms since initial evaluation. Baseline:  Goal status: Goal Met 09/07/21 (reports 50% improvement)     LONG TERM GOALS: Target date: 09/27/2021    Pt will be independent with advanced HEP. Baseline:  Goal status: Ongoing   2.  Pt will increase FOTO to at least 55% to demonstrate improvements in functional mobility. Baseline: 43% Goal status: MET 09/21/21   3.  Pt will increase right hip/knee strength to at least 4+/5 to allow for easier navigation of steps. Baseline: 4/5 Goal status: ONGOING   4.  Pt will report being able to stand/ambulate at least 20 minutes without increased pain to allow her to complete functional and community activities. Baseline: pain 7/10 with standing Goal status: ONGOING   5.  Pt will increase right knee extension to 0 degrees to allow for improved gait pattern. Baseline:  missing 15 degrees from full extension Goal status:  Ongoing (see above)     PLAN: PT FREQUENCY:  Continue 2x/week   PT DURATION: for an additional 8 weeks ending 11/19/21   PLANNED INTERVENTIONS: Therapeutic exercises,  Therapeutic activity, Neuromuscular re-education, Balance training, Gait training, Patient/Family education, Self Care, Joint mobilization, Joint manipulation, Stair training, DME  instructions, Aquatic Therapy, Dry Needling, Electrical stimulation, Cryotherapy, Moist heat, Taping, Ultrasound, Ionotophoresis 65m/ml Dexamethasone, Manual therapy, and Re-evaluation   PLAN FOR NEXT SESSION: progress HEP as indicated, inquire as to whether patient obtained rollator walker.      JAnderson MaltaB. Infantof Villagomez, PT 10/21/21 2:03 PM   [address] SLincoln ParkGTontogany [place] [address]Phone # [phone] [phone]

## 2021-10-26 ENCOUNTER — Ambulatory Visit: Payer: Medicare PPO

## 2021-10-26 DIAGNOSIS — R252 Cramp and spasm: Secondary | ICD-10-CM | POA: Diagnosis not present

## 2021-10-26 DIAGNOSIS — G8929 Other chronic pain: Secondary | ICD-10-CM | POA: Diagnosis not present

## 2021-10-26 DIAGNOSIS — R262 Difficulty in walking, not elsewhere classified: Secondary | ICD-10-CM

## 2021-10-26 DIAGNOSIS — M6281 Muscle weakness (generalized): Secondary | ICD-10-CM

## 2021-10-26 DIAGNOSIS — R2689 Other abnormalities of gait and mobility: Secondary | ICD-10-CM

## 2021-10-26 DIAGNOSIS — M25561 Pain in right knee: Secondary | ICD-10-CM | POA: Diagnosis not present

## 2021-10-26 NOTE — Therapy (Signed)
OUTPATIENT PHYSICAL THERAPY TREATMENT NOTE   Patient Name: Daisy Bryant MRN: [id number] DOB:1930-06-12, 88 y.o., female Today's Date: 10/26/2021    END OF SESSION:   PT End of Session - 10/26/21 1204     Visit Number 15    Number of Visits 19    Date for PT Re-Evaluation 11/19/21    Authorization Type [employer]    Authorization Time Period 09/27/21-11/19/2021    Authorization - Visit Number 15    Authorization - Number of Visits 19    Progress Note Due on Visit 45    PT Start Time 1203    PT Stop Time 1230    PT Time Calculation (min) 27 min    Activity Tolerance Patient tolerated treatment well    Behavior During Therapy [facility] for tasks assessed/performed              Past Medical History:  Diagnosis Date   Acute gastric ulcer without mention of hemorrhage, perforation, or obstruction 2002   Anemia    Anxiety    Arthritis    Cancer ([place])    CAP (community acquired pneumonia) 07/04/2019   Colon polyp 2010   HYPERPLASTIC POLYP   Depression    Diverticulosis of colon (without mention of hemorrhage)    GERD (gastroesophageal reflux disease)    H/O hiatal hernia 2002   Hypertension    Hypothyroidism    Neuromuscular disorder ([place])    Stricture and stenosis of esophagus 2002   Tuberculosis    Past Surgical History:  Procedure Laterality Date   BREAST SURGERY     ENTEROSCOPY  11/09/2011   Procedure: ENTEROSCOPY;  Surgeon: Inda Castle, MD;  Location: [facility];  Service: Endoscopy;  Laterality: N/A;   EYE SURGERY     JOINT REPLACEMENT Left    knee   MASTECTOMY     TONSILLECTOMY  under age 10   Patient Active Problem List   Diagnosis Date Noted   Edema of foot 09/21/2020   Routine general medical examination at a health care facility 01/05/2018   B12 deficiency 10/06/2015   ANEMIA, IRON DEFICIENCY 04/22/2008   PEPTIC STRICTURE 04/17/2008   CELIAC DISEASE 09/19/2007   ADENOCARCINOMA, BREAST, RIGHT 03/13/2007   Hypothyroidism 03/13/2007    Hyperlipidemia 03/13/2007   Depression 03/13/2007   PERIPHERAL NEUROPATHY 03/13/2007   Essential hypertension 03/13/2007   GASTROESOPHAGEAL REFLUX DISEASE 03/13/2007   Osteoarthritis 03/13/2007    PCP: Hoyt Koch, MD   REFERRING PROVIDER: Gregor Hams, MD   REFERRING DIAG: [phone] (ICD-10-CM) - Chronic pain of right knee R26.9 (ICD-10-CM) - Abnormality of gait    THERAPY DIAG:  Chronic pain of right knee   Muscle weakness (generalized)   Other abnormalities of gait and mobility   Rationale for Evaluation and Treatment Rehabilitation   ONSET DATE: Overall for a year with last couple of months getting worse   SUBJECTIVE:    SUBJECTIVE STATEMENT: Patient 18 min late for appt.  She states "everything went wrong this morning and then I had to stop and get gas".  She confirms that her new walker came in but it is still in the box and she will put that together today.        PERTINENT HISTORY: OA, L knee TKA in 1991, Anemia, Breast Cancer, Tuberculosis exposure at 88 years old   PAIN:  Are you having pain? Yes: NPRS scale: 5/10 Pain location: right knee Pain description: dull hurt Aggravating factors: standing Relieving factors: taking pressure  off her knee   PRECAUTIONS: Fall   WEIGHT BEARING RESTRICTIONS No   FALLS:  Has patient fallen in last 6 months?  Pt denies falls, but states that she has "caught herself" about 3 times   LIVING ENVIRONMENT: Lives with:  lives alone with her [place], [place] Lives in: House/apartment Stairs: Yes: External: 2 steps; can reach both Has following equipment at home: Single point cane, shower chair, and Grab bars   OCCUPATION: retired   PLOF: Independent and Leisure: Barista, [place], playing on computer   PATIENT GOALS:  To avoid having right knee surgery and to decrease her pain.     OBJECTIVE:    DIAGNOSTIC FINDINGS:  Right knee radiograph on 07/29/21: IMPRESSION: 1. Severe medial and patellofemoral  compartment osteoarthritis. 2. Small joint effusion.   PATIENT SURVEYS:  Eval:  FOTO 43% (projected 55% by visit 12) 09/21/2021:  56%   COGNITION:           Overall cognitive status: Within functional limits for tasks assessed                          SENSATION: WFL   MUSCLE LENGTH: Right hamstring tightness with pain   POSTURE: rounded shoulders   PALPATION: Tender to palpation along right knee   LOWER EXTREMITY ROM:   Active ROM Right eval Right 09/07/2021 Right 09/21/2021 Left eval  Hip flexion        Hip extension        Hip abduction        Hip adduction        Hip internal rotation        Hip external rotation        Knee flexion 120 with pain 111 In supine 115 120  Knee extension 15 9 in supine 9 0  Ankle dorsiflexion        Ankle plantarflexion        Ankle inversion        Ankle eversion         (Blank rows = not tested)   LOWER EXTREMITY MMT:   R hip/knee strength of 4/5, L hip/knee strength 5/5   LOWER EXTREMITY SPECIAL TESTS:  Knee special tests: Anterior drawer test: negative and Posterior drawer test: negative   FUNCTIONAL TESTS:  Eval: 5 times sit to stand: 18.84 Timed up and go (TUG): 15.9 sec  09/21/2021: 5 times sit to stand: 13.5 sec from PT mat Timed up and go (TUG): 13.5 sec without assistive device    GAIT: Distance walked: 50 ft Assistive device utilized: None Level of assistance: Modified independence Comments: Antalgic gait with decreased weight bearing through RLE.       TODAY'S TREATMENT: 10/26/2021: Patient was 18 min late for appt.    Nustep level 3 x 6 min with PT present to discuss status Seated with 3# (4# being used):  heel/toe raises, marching, LAQ, hip ER.  BLE 2x10 each Seated hamstring curl with green band 2 x 10 both Clamshell with green loop 2 x 10 Hip adduction ball squeeze 2x10   Patient reminded of need to prop for extension to avoid knee instability.  Her extension looked worse today.  We also discussed  how use of cane and walker will help with this.  Her gait pattern should improve and allow her to work on her extension more.   Supine quad set, TKE and LAQ x 20 with 3 lb  10/21/2021: Patient had questions  about rollator walker: she had looked at some in the store but wasn't sure about weight, wheel size, width:  searched images and made suggestions about wheel size, width and weight limits for heavy duty vs normal weight sized rollators.  Printed out pictures of 2 walkers that would be appropriate for her.   Nustep level 3 x 6 min with PT present to discuss status Seated with 4#:  heel/toe raises, marching, LAQ, hip ER.  BLE 2x10 each Seated hamstring curl with red band 2 x 10 both Clamshell with green loop 2 x 10 Hip adduction ball squeeze 2x10    10/19/2021: Nustep level 3 x 6 min with PT present to discuss status Seated with 4#:  heel/toe raises, marching, LAQ, hip ER.  BLE 2x10 each Seated hamstring curl with red band 2 x 10 both Clamshell with green loop 2 x 10 Hip adduction ball squeeze 2x10 Supine:  Quad set, TKE with small green noodle, SAQ, SLR,  BLE 2x10 each Lengthy discussion about proper a.d. due to the level of degeneration of her right knee and its susceptibility to buckle.  She does not want to do TKA.  Suggested rollator walker.     PATIENT EDUCATION:  Education details: Issued HEP Person educated: Patient Education method: Explanation, Media planner, and Handouts Education comprehension: verbalized understanding and returned demonstration     HOME EXERCISE PROGRAM: Access Code: [email or web address] URL: [email or web address] Date: 08/02/2021 Prepared by: Juel Burrow   Exercises - Seated Heel Toe Raises  - 2 x daily - 7 x weekly - 2 sets - 10 reps - Seated March  - 2 x daily - 7 x weekly - 2 sets - 10 reps - Seated Long Arc Quad  - 2 x daily - 7 x weekly - 2 sets - 10 reps - Seated Hip Adduction Isometrics with Ball  - 2 x daily - 7 x weekly - 2 sets - 10  reps   ASSESSMENT:   CLINICAL IMPRESSION: Hadyn continues to have increased pain in right knee and bilateral LE edema.  She was able to complete all exercises and has ordered her walker.   She would benefit from transitioning to rollator walker when leaving home for safety and improved gait.     OBJECTIVE IMPAIRMENTS decreased balance, difficulty walking, decreased ROM, decreased strength, impaired flexibility, and pain.    ACTIVITY LIMITATIONS standing, squatting, and stairs   PARTICIPATION LIMITATIONS: laundry, community activity, and yard work   PERSONAL FACTORS Age, Time since onset of injury/illness/exacerbation, and 3+ comorbidities: OA, Hx of breast cancer, Hx of L TKA, HTN  are also affecting patient's functional outcome.    REHAB POTENTIAL: Good   CLINICAL DECISION MAKING: Evolving/moderate complexity   EVALUATION COMPLEXITY: Moderate     GOALS: Goals reviewed with patient? Yes   SHORT TERM GOALS: Target date: 08/23/2021  Pt will be independent with initial HEP. Baseline: Goal status: MET   2.  Pt will report at least a 40% improvement in symptoms since initial evaluation. Baseline:  Goal status: Goal Met 09/07/21 (reports 50% improvement)     LONG TERM GOALS: Target date: 09/27/2021    Pt will be independent with advanced HEP. Baseline:  Goal status: Ongoing   2.  Pt will increase FOTO to at least 55% to demonstrate improvements in functional mobility. Baseline: 43% Goal status: MET 09/21/21   3.  Pt will increase right hip/knee strength to at least 4+/5 to allow for easier navigation of steps. Baseline: 4/5 Goal status: ONGOING  4.  Pt will report being able to stand/ambulate at least 20 minutes without increased pain to allow her to complete functional and community activities. Baseline: pain 7/10 with standing Goal status: ONGOING   5.  Pt will increase right knee extension to 0 degrees to allow for improved gait pattern. Baseline:  missing 15 degrees  from full extension Goal status:  Ongoing (see above)     PLAN: PT FREQUENCY:  Continue 2x/week   PT DURATION: for an additional 8 weeks ending 11/19/21   PLANNED INTERVENTIONS: Therapeutic exercises, Therapeutic activity, Neuromuscular re-education, Balance training, Gait training, Patient/Family education, Self Care, Joint mobilization, Joint manipulation, Stair training, DME instructions, Aquatic Therapy, Dry Needling, Electrical stimulation, Cryotherapy, Moist heat, Taping, Ultrasound, Ionotophoresis 83m/ml Dexamethasone, Manual therapy, and Re-evaluation   PLAN FOR NEXT SESSION: progress HEP as indicated, inquire as to whether patient obtained rollator walker.      JAnderson MaltaB. Reyanne Hussar, PT 10/26/21 1:19 PM   [facility] [address] SM[place] CityG[place]ne[place] [address]Phone # [phone] [phone]

## 2021-10-28 ENCOUNTER — Ambulatory Visit: Payer: Medicare PPO

## 2021-10-28 DIAGNOSIS — R2689 Other abnormalities of gait and mobility: Secondary | ICD-10-CM

## 2021-10-28 DIAGNOSIS — R262 Difficulty in walking, not elsewhere classified: Secondary | ICD-10-CM | POA: Diagnosis not present

## 2021-10-28 DIAGNOSIS — R252 Cramp and spasm: Secondary | ICD-10-CM | POA: Diagnosis not present

## 2021-10-28 DIAGNOSIS — M6281 Muscle weakness (generalized): Secondary | ICD-10-CM | POA: Diagnosis not present

## 2021-10-28 DIAGNOSIS — M25561 Pain in right knee: Secondary | ICD-10-CM | POA: Diagnosis not present

## 2021-10-28 DIAGNOSIS — G8929 Other chronic pain: Secondary | ICD-10-CM

## 2021-10-28 NOTE — Therapy (Signed)
OUTPATIENT PHYSICAL THERAPY TREATMENT NOTE   Patient Name: Daisy Bryant MRN: [id number] DOB:June 05, 1930, 88 y.o., female Today's Date: 10/28/2021    END OF SESSION:   PT End of Session - 10/28/21 1155     Visit Number 16    Number of Visits 19    Date for PT Re-Evaluation 11/19/21    Authorization Type [employer]    Authorization Time Period 09/27/21-11/19/2021    Authorization - Visit Number 27    Authorization - Number of Visits 19    Progress Note Due on Visit 20    PT Start Time 1148    PT Stop Time 1226    PT Time Calculation (min) 38 min    Activity Tolerance Patient tolerated treatment well    Behavior During Therapy WFL for tasks assessed/performed              Past Medical History:  Diagnosis Date   Acute gastric ulcer without mention of hemorrhage, perforation, or obstruction 2002   Anemia    Anxiety    Arthritis    Cancer ([place])    CAP (community acquired pneumonia) 07/04/2019   Colon polyp 2010   HYPERPLASTIC POLYP   Depression    Diverticulosis of colon (without mention of hemorrhage)    GERD (gastroesophageal reflux disease)    H/O hiatal hernia 2002   Hypertension    Hypothyroidism    Neuromuscular disorder ([place])    Stricture and stenosis of esophagus 2002   Tuberculosis    Past Surgical History:  Procedure Laterality Date   BREAST SURGERY     ENTEROSCOPY  11/09/2011   Procedure: ENTEROSCOPY;  Surgeon: Inda Castle, MD;  Location: [facility];  Service: Endoscopy;  Laterality: N/A;   EYE SURGERY     JOINT REPLACEMENT Left    knee   MASTECTOMY     TONSILLECTOMY  under age 53   Patient Active Problem List   Diagnosis Date Noted   Edema of foot 09/21/2020   Routine general medical examination at a health care facility 01/05/2018   B12 deficiency 10/06/2015   ANEMIA, IRON DEFICIENCY 04/22/2008   PEPTIC STRICTURE 04/17/2008   CELIAC DISEASE 09/19/2007   ADENOCARCINOMA, BREAST, RIGHT 03/13/2007   Hypothyroidism 03/13/2007    Hyperlipidemia 03/13/2007   Depression 03/13/2007   PERIPHERAL NEUROPATHY 03/13/2007   Essential hypertension 03/13/2007   GASTROESOPHAGEAL REFLUX DISEASE 03/13/2007   Osteoarthritis 03/13/2007    PCP: Hoyt Koch, MD   REFERRING PROVIDER: Gregor Hams, MD   REFERRING DIAG: [phone] (ICD-10-CM) - Chronic pain of right knee R26.9 (ICD-10-CM) - Abnormality of gait    THERAPY DIAG:  Chronic pain of right knee   Muscle weakness (generalized)   Other abnormalities of gait and mobility   Rationale for Evaluation and Treatment Rehabilitation   ONSET DATE: Overall for a year with last couple of months getting worse   SUBJECTIVE:    SUBJECTIVE STATEMENT: Patient states she is hurting in her right shoulder and right knee today.          PERTINENT HISTORY: OA, L knee TKA in 1991, Anemia, Breast Cancer, Tuberculosis exposure at 88 years old   PAIN:  Are you having pain? Yes: NPRS scale: 5/10 Pain location: right knee Pain description: dull hurt Aggravating factors: standing Relieving factors: taking pressure off her knee   PRECAUTIONS: Fall   WEIGHT BEARING RESTRICTIONS No   FALLS:  Has patient fallen in last 6 months?  Pt denies falls, but states that  she has "caught herself" about 3 times   LIVING ENVIRONMENT: Lives with:  lives alone with her [place], [place] Lives in: House/apartment Stairs: Yes: External: 2 steps; can reach both Has following equipment at home: Single point cane, shower chair, and Grab bars   OCCUPATION: retired   PLOF: Independent and Leisure: Barista, [place], playing on computer   PATIENT GOALS:  To avoid having right knee surgery and to decrease her pain.     OBJECTIVE:    DIAGNOSTIC FINDINGS:  Right knee radiograph on 07/29/21: IMPRESSION: 1. Severe medial and patellofemoral compartment osteoarthritis. 2. Small joint effusion.   PATIENT SURVEYS:  Eval:  FOTO 43% (projected 55% by visit 12) 09/21/2021:  56%    COGNITION:           Overall cognitive status: Within functional limits for tasks assessed                          SENSATION: WFL   MUSCLE LENGTH: Right hamstring tightness with pain   POSTURE: rounded shoulders   PALPATION: Tender to palpation along right knee   LOWER EXTREMITY ROM:   Active ROM Right eval Right 09/07/2021 Right 09/21/2021 Left eval  Hip flexion        Hip extension        Hip abduction        Hip adduction        Hip internal rotation        Hip external rotation        Knee flexion 120 with pain 111 In supine 115 120  Knee extension 15 9 in supine 9 0  Ankle dorsiflexion        Ankle plantarflexion        Ankle inversion        Ankle eversion         (Blank rows = not tested)   LOWER EXTREMITY MMT:   R hip/knee strength of 4/5, L hip/knee strength 5/5   LOWER EXTREMITY SPECIAL TESTS:  Knee special tests: Anterior drawer test: negative and Posterior drawer test: negative   FUNCTIONAL TESTS:  Eval: 5 times sit to stand: 18.84 Timed up and go (TUG): 15.9 sec  09/21/2021: 5 times sit to stand: 13.5 sec from PT mat Timed up and go (TUG): 13.5 sec without assistive device    GAIT: Distance walked: 50 ft Assistive device utilized: None Level of assistance: Modified independence Comments: Antalgic gait with decreased weight bearing through RLE.       TODAY'S TREATMENT: 10/28/2021:   Nustep level 4 x 6 min with PT present to discuss status Seated with 5#:  heel/toe raises, marching, LAQ, hip ER.  BLE 2x10 each Seated hamstring curl with green band 2 x 10 both Clamshell with yellow loop 2 x 10 Hip adduction ball squeeze 2x10   Up and over hurdle x 10 each LE Sit to stand 2 x 5  Standing TKE 2 x 10 with green band  10/26/2021: Patient was 18 min late for appt.    Nustep level 3 x 6 min with PT present to discuss status Seated with 3# (4# being used):  heel/toe raises, marching, LAQ, hip ER.  BLE 2x10 each Seated hamstring curl with  green band 2 x 10 both Clamshell with green loop 2 x 10 Hip adduction ball squeeze 2x10   Patient reminded of need to prop for extension to avoid knee instability.  Her extension looked worse today.  We also discussed how use of cane and walker will help with this.  Her gait pattern should improve and allow her to work on her extension more.   Supine quad set, TKE and LAQ x 20 with 3 lb  10/21/2021: Patient had questions about rollator walker: she had looked at some in the store but wasn't sure about weight, wheel size, width:  searched images and made suggestions about wheel size, width and weight limits for heavy duty vs normal weight sized rollators.  Printed out pictures of 2 walkers that would be appropriate for her.   Nustep level 3 x 6 min with PT present to discuss status Seated with 4#:  heel/toe raises, marching, LAQ, hip ER.  BLE 2x10 each Seated hamstring curl with red band 2 x 10 both Clamshell with green loop 2 x 10 Hip adduction ball squeeze 2x10    PATIENT EDUCATION:  Education details: Issued HEP Person educated: Patient Education method: Explanation, Demonstration, and Handouts Education comprehension: verbalized understanding and returned demonstration     HOME EXERCISE PROGRAM: Access Code: [email or web address] URL: [email or web address] Date: 08/02/2021 Prepared by: Juel Burrow   Exercises - Seated Heel Toe Raises  - 2 x daily - 7 x weekly - 2 sets - 10 reps - Seated March  - 2 x daily - 7 x weekly - 2 sets - 10 reps - Seated Long Arc Quad  - 2 x daily - 7 x weekly - 2 sets - 10 reps - Seated Hip Adduction Isometrics with Ball  - 2 x daily - 7 x weekly - 2 sets - 10 reps   ASSESSMENT:   CLINICAL IMPRESSION: Aybree was able to complete all tasks with minimal pain and moderate fatigue.  She has developed slight deformity in right knee but this does not seem to be worsening.     She still has not put her rollator walker together.  She would benefit from  transitioning to rollator walker when leaving home for safety and improved gait.     OBJECTIVE IMPAIRMENTS decreased balance, difficulty walking, decreased ROM, decreased strength, impaired flexibility, and pain.    ACTIVITY LIMITATIONS standing, squatting, and stairs   PARTICIPATION LIMITATIONS: laundry, community activity, and yard work   PERSONAL FACTORS Age, Time since onset of injury/illness/exacerbation, and 3+ comorbidities: OA, Hx of breast cancer, Hx of L TKA, HTN  are also affecting patient's functional outcome.    REHAB POTENTIAL: Good   CLINICAL DECISION MAKING: Evolving/moderate complexity   EVALUATION COMPLEXITY: Moderate     GOALS: Goals reviewed with patient? Yes   SHORT TERM GOALS: Target date: 08/23/2021  Pt will be independent with initial HEP. Baseline: Goal status: MET   2.  Pt will report at least a 40% improvement in symptoms since initial evaluation. Baseline:  Goal status: Goal Met 09/07/21 (reports 50% improvement)     LONG TERM GOALS: Target date: 09/27/2021    Pt will be independent with advanced HEP. Baseline:  Goal status: Ongoing   2.  Pt will increase FOTO to at least 55% to demonstrate improvements in functional mobility. Baseline: 43% Goal status: MET 09/21/21   3.  Pt will increase right hip/knee strength to at least 4+/5 to allow for easier navigation of steps. Baseline: 4/5 Goal status: ONGOING   4.  Pt will report being able to stand/ambulate at least 20 minutes without increased pain to allow her to complete functional and community activities. Baseline: pain 7/10 with standing Goal status: ONGOING   5.  Pt will increase right knee extension to 0 degrees to allow for improved gait pattern. Baseline:  missing 15 degrees from full extension Goal status:  Ongoing (see above)     PLAN: PT FREQUENCY:  Continue 2x/week   PT DURATION: for an additional 8 weeks ending 11/19/21   PLANNED INTERVENTIONS: Therapeutic exercises,  Therapeutic activity, Neuromuscular re-education, Balance training, Gait training, Patient/Family education, Self Care, Joint mobilization, Joint manipulation, Stair training, DME instructions, Aquatic Therapy, Dry Needling, Electrical stimulation, Cryotherapy, Moist heat, Taping, Ultrasound, Ionotophoresis 95m/ml Dexamethasone, Manual therapy, and Re-evaluation   PLAN FOR NEXT SESSION: progress HEP as indicated, inquire as to whether patient obtained rollator walker.      JAnderson MaltaB. Stephaun Million, PT 10/28/21 12:28 PM   [address] Creek[place]ancock [place] [address]Phone # [phone] [phone]

## 2021-11-02 ENCOUNTER — Ambulatory Visit: Payer: Medicare PPO

## 2021-11-04 ENCOUNTER — Ambulatory Visit: Payer: Medicare PPO | Attending: Family Medicine

## 2021-11-04 DIAGNOSIS — M25561 Pain in right knee: Secondary | ICD-10-CM | POA: Diagnosis not present

## 2021-11-04 DIAGNOSIS — R2689 Other abnormalities of gait and mobility: Secondary | ICD-10-CM

## 2021-11-04 DIAGNOSIS — G8929 Other chronic pain: Secondary | ICD-10-CM

## 2021-11-04 DIAGNOSIS — M6281 Muscle weakness (generalized): Secondary | ICD-10-CM

## 2021-11-04 DIAGNOSIS — R252 Cramp and spasm: Secondary | ICD-10-CM | POA: Diagnosis not present

## 2021-11-04 DIAGNOSIS — R262 Difficulty in walking, not elsewhere classified: Secondary | ICD-10-CM | POA: Diagnosis not present

## 2021-11-04 NOTE — Therapy (Signed)
OUTPATIENT PHYSICAL THERAPY TREATMENT NOTE   Patient Name: Daisy Bryant MRN: [id number] DOB:14-Aug-1930, 88 y.o., female Today's Date: 11/04/2021    END OF SESSION:   PT End of Session - 11/04/21 1152     Visit Number 17    Number of Visits 19    Date for PT Re-Evaluation 11/19/21    Authorization Type [employer]    Authorization Time Period 09/27/21-11/19/2021    Authorization - Visit Number 87    Authorization - Number of Visits 19    Progress Note Due on Visit 20    PT Start Time 2353    PT Stop Time 1230    PT Time Calculation (min) 45 min    Activity Tolerance Patient tolerated treatment well    Behavior During Therapy [facility] for tasks assessed/performed              Past Medical History:  Diagnosis Date   Acute gastric ulcer without mention of hemorrhage, perforation, or obstruction 2002   Anemia    Anxiety    Arthritis    Cancer ([place])    CAP (community acquired pneumonia) 07/04/2019   Colon polyp 2010   HYPERPLASTIC POLYP   Depression    Diverticulosis of colon (without mention of hemorrhage)    GERD (gastroesophageal reflux disease)    H/O hiatal hernia 2002   Hypertension    Hypothyroidism    Neuromuscular disorder ([place])    Stricture and stenosis of esophagus 2002   Tuberculosis    Past Surgical History:  Procedure Laterality Date   BREAST SURGERY     ENTEROSCOPY  11/09/2011   Procedure: ENTEROSCOPY;  Surgeon: Inda Castle, MD;  Location: [facility];  Service: Endoscopy;  Laterality: N/A;   EYE SURGERY     JOINT REPLACEMENT Left    knee   MASTECTOMY     TONSILLECTOMY  under age 89   Patient Active Problem List   Diagnosis Date Noted   Edema of foot 09/21/2020   Routine general medical examination at a health care facility 01/05/2018   B12 deficiency 10/06/2015   ANEMIA, IRON DEFICIENCY 04/22/2008   PEPTIC STRICTURE 04/17/2008   CELIAC DISEASE 09/19/2007   ADENOCARCINOMA, BREAST, RIGHT 03/13/2007   Hypothyroidism 03/13/2007    Hyperlipidemia 03/13/2007   Depression 03/13/2007   PERIPHERAL NEUROPATHY 03/13/2007   Essential hypertension 03/13/2007   GASTROESOPHAGEAL REFLUX DISEASE 03/13/2007   Osteoarthritis 03/13/2007    PCP: Hoyt Koch, MD   REFERRING PROVIDER: Gregor Hams, MD   REFERRING DIAG: [phone] (ICD-10-CM) - Chronic pain of right knee R26.9 (ICD-10-CM) - Abnormality of gait    THERAPY DIAG:  Chronic pain of right knee   Muscle weakness (generalized)   Other abnormalities of gait and mobility   Rationale for Evaluation and Treatment Rehabilitation   ONSET DATE: Overall for a year with last couple of months getting worse   SUBJECTIVE:    SUBJECTIVE STATEMENT: Patient states she woke up with some mild pain in right shoulder but feeling ok now.  Right knee is about the same.           PERTINENT HISTORY: OA, L knee TKA in 1991, Anemia, Breast Cancer, Tuberculosis exposure at 88 years old   PAIN:  Are you having pain? Yes: NPRS scale: 5/10 Pain location: right knee Pain description: dull hurt Aggravating factors: standing Relieving factors: taking pressure off her knee   PRECAUTIONS: Fall   WEIGHT BEARING RESTRICTIONS No   FALLS:  Has patient fallen  in last 6 months?  Pt denies falls, but states that she has "caught herself" about 3 times   LIVING ENVIRONMENT: Lives with:  lives alone with her [place], [place] Lives in: House/apartment Stairs: Yes: External: 2 steps; can reach both Has following equipment at home: Single point cane, shower chair, and Grab bars   OCCUPATION: retired   PLOF: Independent and Leisure: Barista, [place], playing on computer   PATIENT GOALS:  To avoid having right knee surgery and to decrease her pain.     OBJECTIVE:    DIAGNOSTIC FINDINGS:  Right knee radiograph on 07/29/21: IMPRESSION: 1. Severe medial and patellofemoral compartment osteoarthritis. 2. Small joint effusion.   PATIENT SURVEYS:  Eval:  FOTO 43% (projected 55%  by visit 12) 09/21/2021:  56%   COGNITION:           Overall cognitive status: Within functional limits for tasks assessed                          SENSATION: WFL   MUSCLE LENGTH: Right hamstring tightness with pain   POSTURE: rounded shoulders   PALPATION: Tender to palpation along right knee   LOWER EXTREMITY ROM:   Active ROM Right eval Right 09/07/2021 Right 09/21/2021 Left eval  Hip flexion        Hip extension        Hip abduction        Hip adduction        Hip internal rotation        Hip external rotation        Knee flexion 120 with pain 111 In supine 115 120  Knee extension 15 9 in supine 9 0  Ankle dorsiflexion        Ankle plantarflexion        Ankle inversion        Ankle eversion         (Blank rows = not tested)   LOWER EXTREMITY MMT:   R hip/knee strength of 4/5, L hip/knee strength 5/5   LOWER EXTREMITY SPECIAL TESTS:  Knee special tests: Anterior drawer test: negative and Posterior drawer test: negative   FUNCTIONAL TESTS:  Eval: 5 times sit to stand: 18.84 Timed up and go (TUG): 15.9 sec  09/21/2021: 5 times sit to stand: 13.5 sec from PT mat Timed up and go (TUG): 13.5 sec without assistive device    GAIT: Distance walked: 50 ft Assistive device utilized: None Level of assistance: Modified independence Comments: Antalgic gait with decreased weight bearing through RLE.       TODAY'S TREATMENT: 11/04/2021:   Nustep level 4 x 8 min with PT present to discuss status Seated with 5#:  heel/toe raises, marching, LAQ, hip ER.  BLE 2x10 each Seated hamstring curl with green band 2 x 10 both Clamshell with yellow loop 2 x 10 Hip adduction ball squeeze 2x10   Up and over hurdle x 10 each LE Sit to stand 2 x 5  Standing TKE 2 x 10 with green band Lengthy discussion about using the walker that she purchased to be able to walk with improved gait pattern and avoid further kinetic chain issues due to her end stage right knee.    10/28/2021:    Nustep level 4 x 6 min with PT present to discuss status Seated with 5#:  heel/toe raises, marching, LAQ, hip ER.  BLE 2x10 each Seated hamstring curl with green band 2 x 10 both  Clamshell with yellow loop 2 x 10 Hip adduction ball squeeze 2x10   Up and over hurdle x 10 each LE Sit to stand 2 x 5  Standing TKE 2 x 10 with green band  10/26/2021: Patient was 18 min late for appt.    Nustep level 3 x 6 min with PT present to discuss status Seated with 3# (4# being used):  heel/toe raises, marching, LAQ, hip ER.  BLE 2x10 each Seated hamstring curl with green band 2 x 10 both Clamshell with green loop 2 x 10 Hip adduction ball squeeze 2x10   Patient reminded of need to prop for extension to avoid knee instability.  Her extension looked worse today.  We also discussed how use of cane and walker will help with this.  Her gait pattern should improve and allow her to work on her extension more.   Supine quad set, TKE and LAQ x 20 with 3 lb   PATIENT EDUCATION:  Education details: Issued HEP Person educated: Patient Education method: Explanation, Demonstration, and Handouts Education comprehension: verbalized understanding and returned demonstration     HOME EXERCISE PROGRAM: Access Code: [email or web address] URL: [email or web address] Date: 08/02/2021 Prepared by: Juel Burrow   Exercises - Seated Heel Toe Raises  - 2 x daily - 7 x weekly - 2 sets - 10 reps - Seated March  - 2 x daily - 7 x weekly - 2 sets - 10 reps - Seated Long Arc Quad  - 2 x daily - 7 x weekly - 2 sets - 10 reps - Seated Hip Adduction Isometrics with Ball  - 2 x daily - 7 x weekly - 2 sets - 10 reps   ASSESSMENT:   CLINICAL IMPRESSION: Anitra completed all tasks with min fatigue.  She has her walker put together but is using her cane when going out.  She admits she is able to move faster and with less limping with the walker.   She would benefit from transitioning to rollator walker when leaving home for  safety and improved gait.  She would benefit from continuing skilled PT for functional strength and safety awareness.     OBJECTIVE IMPAIRMENTS decreased balance, difficulty walking, decreased ROM, decreased strength, impaired flexibility, and pain.    ACTIVITY LIMITATIONS standing, squatting, and stairs   PARTICIPATION LIMITATIONS: laundry, community activity, and yard work   PERSONAL FACTORS Age, Time since onset of injury/illness/exacerbation, and 3+ comorbidities: OA, Hx of breast cancer, Hx of L TKA, HTN  are also affecting patient's functional outcome.    REHAB POTENTIAL: Good   CLINICAL DECISION MAKING: Evolving/moderate complexity   EVALUATION COMPLEXITY: Moderate     GOALS: Goals reviewed with patient? Yes   SHORT TERM GOALS: Target date: 08/23/2021  Pt will be independent with initial HEP. Baseline: Goal status: MET   2.  Pt will report at least a 40% improvement in symptoms since initial evaluation. Baseline:  Goal status: Goal Met 09/07/21 (reports 50% improvement)     LONG TERM GOALS: Target date: 09/27/2021    Pt will be independent with advanced HEP. Baseline:  Goal status: Ongoing   2.  Pt will increase FOTO to at least 55% to demonstrate improvements in functional mobility. Baseline: 43% Goal status: MET 09/21/21   3.  Pt will increase right hip/knee strength to at least 4+/5 to allow for easier navigation of steps. Baseline: 4/5 Goal status: ONGOING   4.  Pt will report being able to stand/ambulate at least 20 minutes without increased  pain to allow her to complete functional and community activities. Baseline: pain 7/10 with standing Goal status: ONGOING   5.  Pt will increase right knee extension to 0 degrees to allow for improved gait pattern. Baseline:  missing 15 degrees from full extension Goal status:  Ongoing (see above)     PLAN: PT FREQUENCY:  Continue 2x/week   PT DURATION: for an additional 8 weeks ending 11/19/21   PLANNED  INTERVENTIONS: Therapeutic exercises, Therapeutic activity, Neuromuscular re-education, Balance training, Gait training, Patient/Family education, Self Care, Joint mobilization, Joint manipulation, Stair training, DME instructions, Aquatic Therapy, Dry Needling, Electrical stimulation, Cryotherapy, Moist heat, Taping, Ultrasound, Ionotophoresis 66m/ml Dexamethasone, Manual therapy, and Re-evaluation   PLAN FOR NEXT SESSION: progress HEP as indicated, inquire as to whether patient obtained rollator walker.      JAnderson MaltaB. Evin Chirco, PT 11/04/21 1:32 PM   [facility] [address] SRound LakeGPrince [place] [address]Phone # [phone] [phone]

## 2021-11-09 ENCOUNTER — Ambulatory Visit: Payer: Medicare PPO

## 2021-11-09 DIAGNOSIS — R252 Cramp and spasm: Secondary | ICD-10-CM

## 2021-11-09 DIAGNOSIS — R262 Difficulty in walking, not elsewhere classified: Secondary | ICD-10-CM

## 2021-11-09 DIAGNOSIS — G8929 Other chronic pain: Secondary | ICD-10-CM

## 2021-11-09 DIAGNOSIS — M6281 Muscle weakness (generalized): Secondary | ICD-10-CM | POA: Diagnosis not present

## 2021-11-09 DIAGNOSIS — M25561 Pain in right knee: Secondary | ICD-10-CM | POA: Diagnosis not present

## 2021-11-09 DIAGNOSIS — R2689 Other abnormalities of gait and mobility: Secondary | ICD-10-CM | POA: Diagnosis not present

## 2021-11-09 NOTE — Therapy (Signed)
OUTPATIENT PHYSICAL THERAPY TREATMENT NOTE   Patient Name: Daisy Bryant MRN: [id number] DOB:1930-01-27, 88 y.o., female Today's Date: 11/09/2021    END OF SESSION:   PT End of Session - 11/09/21 1203     Visit Number 18    Number of Visits 19    Date for PT Re-Evaluation 11/19/21    Authorization Type [employer]    Authorization Time Period 09/27/21-11/19/2021    Authorization - Visit Number 44    Authorization - Number of Visits 19    Progress Note Due on Visit 20    PT Start Time 1152    PT Stop Time 1230    PT Time Calculation (min) 38 min    Activity Tolerance Patient tolerated treatment well    Behavior During Therapy WFL for tasks assessed/performed              Past Medical History:  Diagnosis Date   Acute gastric ulcer without mention of hemorrhage, perforation, or obstruction 2002   Anemia    Anxiety    Arthritis    Cancer ([place])    CAP (community acquired pneumonia) 07/04/2019   Colon polyp 2010   HYPERPLASTIC POLYP   Depression    Diverticulosis of colon (without mention of hemorrhage)    GERD (gastroesophageal reflux disease)    H/O hiatal hernia 2002   Hypertension    Hypothyroidism    Neuromuscular disorder ([place])    Stricture and stenosis of esophagus 2002   Tuberculosis    Past Surgical History:  Procedure Laterality Date   BREAST SURGERY     ENTEROSCOPY  11/09/2011   Procedure: ENTEROSCOPY;  Surgeon: Inda Castle, MD;  Location: [facility];  Service: Endoscopy;  Laterality: N/A;   EYE SURGERY     JOINT REPLACEMENT Left    knee   MASTECTOMY     TONSILLECTOMY  under age 32   Patient Active Problem List   Diagnosis Date Noted   Edema of foot 09/21/2020   Routine general medical examination at a health care facility 01/05/2018   B12 deficiency 10/06/2015   ANEMIA, IRON DEFICIENCY 04/22/2008   PEPTIC STRICTURE 04/17/2008   CELIAC DISEASE 09/19/2007   ADENOCARCINOMA, BREAST, RIGHT 03/13/2007   Hypothyroidism 03/13/2007    Hyperlipidemia 03/13/2007   Depression 03/13/2007   PERIPHERAL NEUROPATHY 03/13/2007   Essential hypertension 03/13/2007   GASTROESOPHAGEAL REFLUX DISEASE 03/13/2007   Osteoarthritis 03/13/2007    PCP: Hoyt Koch, MD   REFERRING PROVIDER: Gregor Hams, MD   REFERRING DIAG: [phone] (ICD-10-CM) - Chronic pain of right knee R26.9 (ICD-10-CM) - Abnormality of gait    THERAPY DIAG:  Chronic pain of right knee   Muscle weakness (generalized)   Other abnormalities of gait and mobility   Rationale for Evaluation and Treatment Rehabilitation   ONSET DATE: Overall for a year with last couple of months getting worse   SUBJECTIVE:    SUBJECTIVE STATEMENT: Patient states right knee is about the same.     (10 min late for appt)      PERTINENT HISTORY: OA, L knee TKA in 1991, Anemia, Breast Cancer, Tuberculosis exposure at 88 years old   PAIN:  Are you having pain? Yes: NPRS scale: 5/10 Pain location: right knee Pain description: dull hurt Aggravating factors: standing Relieving factors: taking pressure off her knee   PRECAUTIONS: Fall   WEIGHT BEARING RESTRICTIONS No   FALLS:  Has patient fallen in last 6 months?  Pt denies falls, but states that  she has "caught herself" about 3 times   LIVING ENVIRONMENT: Lives with:  lives alone with her [place], [place] Lives in: House/apartment Stairs: Yes: External: 2 steps; can reach both Has following equipment at home: Single point cane, shower chair, and Grab bars   OCCUPATION: retired   PLOF: Independent and Leisure: Barista, [place], playing on computer   PATIENT GOALS:  To avoid having right knee surgery and to decrease her pain.     OBJECTIVE:    DIAGNOSTIC FINDINGS:  Right knee radiograph on 07/29/21: IMPRESSION: 1. Severe medial and patellofemoral compartment osteoarthritis. 2. Small joint effusion.   PATIENT SURVEYS:  Eval:  FOTO 43% (projected 55% by visit 12) 09/21/2021:  56%   COGNITION:            Overall cognitive status: Within functional limits for tasks assessed                          SENSATION: WFL   MUSCLE LENGTH: Right hamstring tightness with pain   POSTURE: rounded shoulders   PALPATION: Tender to palpation along right knee   LOWER EXTREMITY ROM:   Active ROM Right eval Right 09/07/2021 Right 09/21/2021 Left eval  Hip flexion        Hip extension        Hip abduction        Hip adduction        Hip internal rotation        Hip external rotation        Knee flexion 120 with pain 111 In supine 115 120  Knee extension 15 9 in supine 9 0  Ankle dorsiflexion        Ankle plantarflexion        Ankle inversion        Ankle eversion         (Blank rows = not tested)   LOWER EXTREMITY MMT:   R hip/knee strength of 4/5, L hip/knee strength 5/5   LOWER EXTREMITY SPECIAL TESTS:  Knee special tests: Anterior drawer test: negative and Posterior drawer test: negative   FUNCTIONAL TESTS:  Eval: 5 times sit to stand: 18.84 Timed up and go (TUG): 15.9 sec  09/21/2021: 5 times sit to stand: 13.5 sec from PT mat Timed up and go (TUG): 13.5 sec without assistive device    GAIT: Distance walked: 50 ft Assistive device utilized: None Level of assistance: Modified independence Comments: Antalgic gait with decreased weight bearing through RLE.       TODAY'S TREATMENT: 11/09/2021:   Nustep level 4 x 8 min with PT present to discuss status Seated with 5#:  heel/toe raises, marching, LAQ, hip ER, BLE 2x10 each Seated hamstring curl with green band 2 x 10 both Discussed seated knee extension stretch once again, to insure that she is not developing knee flexion contracture. Clamshell with yellow loop 2 x 10 Hip adduction ball squeeze 2x10   Sit to stand 2 x 5 with 5 lb kb   11/04/2021:   Nustep level 4 x 8 min with PT present to discuss status Seated with 5#:  heel/toe raises, marching, LAQ, hip ER.  BLE 2x10 each Seated hamstring curl with green band 2  x 10 both Clamshell with yellow loop 2 x 10 Hip adduction ball squeeze 2x10   Up and over hurdle x 10 each LE Sit to stand 2 x 5  Standing TKE 2 x 10 with green band Lengthy discussion about  using the walker that she purchased to be able to walk with improved gait pattern and avoid further kinetic chain issues due to her end stage right knee.    10/28/2021:   Nustep level 4 x 6 min with PT present to discuss status Seated with 5#:  heel/toe raises, marching, LAQ, hip ER.  BLE 2x10 each Seated hamstring curl with green band 2 x 10 both Clamshell with yellow loop 2 x 10 Hip adduction ball squeeze 2x10   Up and over hurdle x 10 each LE Sit to stand 2 x 5  Standing TKE 2 x 10 with green band  PATIENT EDUCATION:  Education details: Issued HEP Person educated: Patient Education method: Explanation, Demonstration, and Handouts Education comprehension: verbalized understanding and returned demonstration     HOME EXERCISE PROGRAM: Access Code: [email or web address] URL: [email or web address] Date: 08/02/2021 Prepared by: Juel Burrow   Exercises - Seated Heel Toe Raises  - 2 x daily - 7 x weekly - 2 sets - 10 reps - Seated March  - 2 x daily - 7 x weekly - 2 sets - 10 reps - Seated Long Arc Quad  - 2 x daily - 7 x weekly - 2 sets - 10 reps - Seated Hip Adduction Isometrics with Ball  - 2 x daily - 7 x weekly - 2 sets - 10 reps   ASSESSMENT:   CLINICAL IMPRESSION: Marilena is using her walker more at home and feels less concerned about falling.  She says her right knee is about the same.  Compliance with her HEP especially extension propping and sit to stand are questionable.  She does explain that she is taking laps around her home when she gets up to go to the bathroom now that she has her walker.  She has one visit left.   She would benefit from her final visit to address the need and review for a consistent HEP.      OBJECTIVE IMPAIRMENTS decreased balance, difficulty walking,  decreased ROM, decreased strength, impaired flexibility, and pain.    ACTIVITY LIMITATIONS standing, squatting, and stairs   PARTICIPATION LIMITATIONS: laundry, community activity, and yard work   PERSONAL FACTORS Age, Time since onset of injury/illness/exacerbation, and 3+ comorbidities: OA, Hx of breast cancer, Hx of L TKA, HTN  are also affecting patient's functional outcome.    REHAB POTENTIAL: Good   CLINICAL DECISION MAKING: Evolving/moderate complexity   EVALUATION COMPLEXITY: Moderate     GOALS: Goals reviewed with patient? Yes   SHORT TERM GOALS: Target date: 08/23/2021  Pt will be independent with initial HEP. Baseline: Goal status: MET   2.  Pt will report at least a 40% improvement in symptoms since initial evaluation. Baseline:  Goal status: Goal Met 09/07/21 (reports 50% improvement)     LONG TERM GOALS: Target date: 09/27/2021    Pt will be independent with advanced HEP. Baseline:  Goal status: Ongoing   2.  Pt will increase FOTO to at least 55% to demonstrate improvements in functional mobility. Baseline: 43% Goal status: MET 09/21/21   3.  Pt will increase right hip/knee strength to at least 4+/5 to allow for easier navigation of steps. Baseline: 4/5 Goal status: ONGOING   4.  Pt will report being able to stand/ambulate at least 20 minutes without increased pain to allow her to complete functional and community activities. Baseline: pain 7/10 with standing Goal status: ONGOING   5.  Pt will increase right knee extension to 0 degrees to allow for improved  gait pattern. Baseline:  missing 15 degrees from full extension Goal status:  Ongoing (see above)     PLAN: PT FREQUENCY:  Continue 2x/week   PT DURATION: for an additional 8 weeks ending 11/19/21   PLANNED INTERVENTIONS: Therapeutic exercises, Therapeutic activity, Neuromuscular re-education, Balance training, Gait training, Patient/Family education, Self Care, Joint mobilization, Joint  manipulation, Stair training, DME instructions, Aquatic Therapy, Dry Needling, Electrical stimulation, Cryotherapy, Moist heat, Taping, Ultrasound, Ionotophoresis 53m/ml Dexamethasone, Manual therapy, and Re-evaluation   PLAN FOR NEXT SESSION: DC next visit, re-assess and review progression of HEP.       JAnderson MaltaB. Akacia Boltz, PT 11/09/21 1:21 PM   [facility] [address] [place] [place] [address]Phone # [phone] [phone]

## 2021-11-11 ENCOUNTER — Ambulatory Visit: Payer: Medicare PPO

## 2021-11-11 DIAGNOSIS — R262 Difficulty in walking, not elsewhere classified: Secondary | ICD-10-CM

## 2021-11-11 DIAGNOSIS — M6281 Muscle weakness (generalized): Secondary | ICD-10-CM | POA: Diagnosis not present

## 2021-11-11 DIAGNOSIS — R252 Cramp and spasm: Secondary | ICD-10-CM

## 2021-11-11 DIAGNOSIS — M25561 Pain in right knee: Secondary | ICD-10-CM | POA: Diagnosis not present

## 2021-11-11 DIAGNOSIS — G8929 Other chronic pain: Secondary | ICD-10-CM | POA: Diagnosis not present

## 2021-11-11 DIAGNOSIS — R2689 Other abnormalities of gait and mobility: Secondary | ICD-10-CM | POA: Diagnosis not present

## 2021-11-11 NOTE — Therapy (Signed)
OUTPATIENT PHYSICAL THERAPY TREATMENT NOTE   Patient Name: Daisy Bryant MRN: [id number] DOB:1930/12/20, 88 y.o., female Today's Date: 11/11/2021    END OF SESSION:   PT End of Session - 11/11/21 1219     Visit Number 19    Number of Visits 19    Date for PT Re-Evaluation 11/19/21    Authorization Type [employer]    Authorization Time Period 09/27/21-11/19/2021    Authorization - Visit Number 35    Authorization - Number of Visits 19    PT Start Time 1962    PT Stop Time 1230    PT Time Calculation (min) 45 min    Activity Tolerance Patient tolerated treatment well    Behavior During Therapy [facility] for tasks assessed/performed              Past Medical History:  Diagnosis Date   Acute gastric ulcer without mention of hemorrhage, perforation, or obstruction 2002   Anemia    Anxiety    Arthritis    Cancer ([place])    CAP (community acquired pneumonia) 07/04/2019   Colon polyp 2010   HYPERPLASTIC POLYP   Depression    Diverticulosis of colon (without mention of hemorrhage)    GERD (gastroesophageal reflux disease)    H/O hiatal hernia 2002   Hypertension    Hypothyroidism    Neuromuscular disorder ([place])    Stricture and stenosis of esophagus 2002   Tuberculosis    Past Surgical History:  Procedure Laterality Date   BREAST SURGERY     ENTEROSCOPY  11/09/2011   Procedure: ENTEROSCOPY;  Surgeon: Inda Castle, MD;  Location: [facility];  Service: Endoscopy;  Laterality: N/A;   EYE SURGERY     JOINT REPLACEMENT Left    knee   MASTECTOMY     TONSILLECTOMY  under age 61   Patient Active Problem List   Diagnosis Date Noted   Edema of foot 09/21/2020   Routine general medical examination at a health care facility 01/05/2018   B12 deficiency 10/06/2015   ANEMIA, IRON DEFICIENCY 04/22/2008   PEPTIC STRICTURE 04/17/2008   CELIAC DISEASE 09/19/2007   ADENOCARCINOMA, BREAST, RIGHT 03/13/2007   Hypothyroidism 03/13/2007   Hyperlipidemia 03/13/2007   Depression  03/13/2007   PERIPHERAL NEUROPATHY 03/13/2007   Essential hypertension 03/13/2007   GASTROESOPHAGEAL REFLUX DISEASE 03/13/2007   Osteoarthritis 03/13/2007    PCP: Hoyt Koch, MD   REFERRING PROVIDER: Gregor Hams, MD   REFERRING DIAG: [phone] (ICD-10-CM) - Chronic pain of right knee R26.9 (ICD-10-CM) - Abnormality of gait    THERAPY DIAG:  Chronic pain of right knee   Muscle weakness (generalized)   Other abnormalities of gait and mobility   Rationale for Evaluation and Treatment Rehabilitation   ONSET DATE: Overall for a year with last couple of months getting worse   SUBJECTIVE:    SUBJECTIVE STATEMENT: Patient states right knee is about the same with regard to pain but I do feel like I can get around better and I have started going to the bathroom farthest away from me and doing more walking at home.      PERTINENT HISTORY: OA, L knee TKA in 1991, Anemia, Breast Cancer, Tuberculosis exposure at 88 years old   PAIN:  Are you having pain? Yes: NPRS scale: 5/10 Pain location: right knee Pain description: dull hurt Aggravating factors: standing Relieving factors: taking pressure off her knee   PRECAUTIONS: Fall   WEIGHT BEARING RESTRICTIONS No   FALLS:  Has patient fallen in last 6 months?  Pt denies falls, but states that she has "caught herself" about 3 times   LIVING ENVIRONMENT: Lives with:  lives alone with her [place], [place] Lives in: House/apartment Stairs: Yes: External: 2 steps; can reach both Has following equipment at home: Single point cane, shower chair, and Grab bars   OCCUPATION: retired   PLOF: Independent and Leisure: Barista, [place], playing on computer   PATIENT GOALS:  To avoid having right knee surgery and to decrease her pain.     OBJECTIVE:    DIAGNOSTIC FINDINGS:  Right knee radiograph on 07/29/21: IMPRESSION: 1. Severe medial and patellofemoral compartment osteoarthritis. 2. Small joint effusion.   PATIENT  SURVEYS:  Eval:  FOTO 43% (projected 55% by visit 12) 09/21/2021:  56%  (FOTO was deleted when PT went in to have patient complete survey for DC)   COGNITION:           Overall cognitive status: Within functional limits for tasks assessed                          SENSATION: WFL   MUSCLE LENGTH: Right hamstring tightness with pain   POSTURE: rounded shoulders   PALPATION: Tender to palpation along right knee   LOWER EXTREMITY ROM:   Active ROM Right eval Right 09/07/2021 Right 09/21/2021 Right  11/11/2021 Left eval  Hip flexion         Hip extension         Hip abduction         Hip adduction         Hip internal rotation         Hip external rotation         Knee flexion 120 with pain 111 In supine 115 unchanged 120  Knee extension 15 9 in supine 9 8-10 0  Ankle dorsiflexion         Ankle plantarflexion         Ankle inversion         Ankle eversion          (Blank rows = not tested)   LOWER EXTREMITY MMT:   R hip/knee strength of 4/5, L hip/knee strength 5/5 DC: right knee/hip generally 4 to 4+/5   LOWER EXTREMITY SPECIAL TESTS:  Knee special tests: Anterior drawer test: negative and Posterior drawer test: negative   FUNCTIONAL TESTS:  Eval: 5 times sit to stand: 18.84 Timed up and go (TUG): 15.9 sec  09/21/2021: 5 times sit to stand: 13.5 sec from PT mat Timed up and go (TUG): 13.5 sec without assistive device  11/11/2021: 5 times sit to stand: 13.27 sec from PT mat Timed up and go (TUG): 13.3 sec without assistive device    GAIT: Distance walked: 50 ft Assistive device utilized: None Level of assistance: Modified independence Comments: Antalgic gait with decreased weight bearing through RLE.       TODAY'S TREATMENT: 11/11/2021:   Nustep level 4 x 8 min with PT present to discuss status Seated with 5#:  heel/toe raises, marching, LAQ, hip ER, BLE 2x10 each Seated hamstring curl with green band 2 x 10 both Clamshell with yellow loop 2 x 10 Hip  adduction ball squeeze 2x10   Sit to stand x 5 Reviewed and updated HEP DC assessment  11/09/2021:   Nustep level 4 x 8 min with PT present to discuss status Seated with 5#:  heel/toe raises, marching, LAQ,  hip ER, BLE 2x10 each Seated hamstring curl with green band 2 x 10 both Discussed seated knee extension stretch once again, to insure that she is not developing knee flexion contracture. Clamshell with yellow loop 2 x 10 Hip adduction ball squeeze 2x10   Sit to stand 2 x 5 with 5 lb kb   11/04/2021:   Nustep level 4 x 8 min with PT present to discuss status Seated with 5#:  heel/toe raises, marching, LAQ, hip ER.  BLE 2x10 each Seated hamstring curl with green band 2 x 10 both Clamshell with yellow loop 2 x 10 Hip adduction ball squeeze 2x10   Up and over hurdle x 10 each LE Sit to stand 2 x 5  Standing TKE 2 x 10 with green band Lengthy discussion about using the walker that she purchased to be able to walk with improved gait pattern and avoid further kinetic chain issues due to her end stage right knee.     PATIENT EDUCATION:  Education details: Issued HEP Person educated: Patient Education method: Explanation, Media planner, and Handouts Education comprehension: verbalized understanding and returned demonstration     HOME EXERCISE PROGRAM: Access Code: [email or web address] URL: [email or web address] Date: 08/02/2021 Prepared by: Juel Burrow   Exercises - Seated Heel Toe Raises  - 2 x daily - 7 x weekly - 2 sets - 10 reps - Seated March  - 2 x daily - 7 x weekly - 2 sets - 10 reps - Seated Long Arc Quad  - 2 x daily - 7 x weekly - 2 sets - 10 reps - Seated Hip Adduction Isometrics with Ball  - 2 x daily - 7 x weekly - 2 sets - 10 reps   ASSESSMENT:   CLINICAL IMPRESSION: Trinna is using her walker more at home and feels less concerned about falling.  She says her right knee is about the same.  She is able to walk longer distances with the rollator in her home  and feels she is getting around better.  She hopes to continue to live at home independently.  Her right knee continues to lack strength at end range and she continues to lack full extension but she is functional enough to live alone safely using the rollator walker.  She will need to practice caution when leaving home as her right knee deteriorates.  She understands that the knee is end stage and needs to be replaced but she does not want to do this if possible.    OBJECTIVE IMPAIRMENTS decreased balance, difficulty walking, decreased ROM, decreased strength, impaired flexibility, and pain.    ACTIVITY LIMITATIONS standing, squatting, and stairs   PARTICIPATION LIMITATIONS: laundry, community activity, and yard work   PERSONAL FACTORS Age, Time since onset of injury/illness/exacerbation, and 3+ comorbidities: OA, Hx of breast cancer, Hx of L TKA, HTN  are also affecting patient's functional outcome.    REHAB POTENTIAL: Good   CLINICAL DECISION MAKING: Evolving/moderate complexity   EVALUATION COMPLEXITY: Moderate     GOALS: Goals reviewed with patient? Yes   SHORT TERM GOALS: Target date: 08/23/2021  Pt will be independent with initial HEP. Baseline: Goal status: MET   2.  Pt will report at least a 40% improvement in symptoms since initial evaluation. Baseline:  Goal status: Goal Met 09/07/21 (reports 50% improvement)     LONG TERM GOALS: Target date: 09/27/2021    Pt will be independent with advanced HEP. Baseline:  Goal status: MET   2.  Pt will increase FOTO  to at least 55% to demonstrate improvements in functional mobility. Baseline: 43% Goal status: MET 09/21/21   3.  Pt will increase right hip/knee strength to at least 4+/5 to allow for easier navigation of steps. Baseline: 4/5 Goal status: MET   4.  Pt will report being able to stand/ambulate at least 20 minutes without increased pain to allow her to complete functional and community activities. Baseline: pain 7/10  with standing Goal status: Partially met   5.  Pt will increase right knee extension to 0 degrees to allow for improved gait pattern. Baseline:  missing 15 degrees from full extension Goal status:  Partially met (lacking 8-9 degrees)     PLAN: PT FREQUENCY:  Continue 2x/week   PT DURATION: for an additional 8 weeks ending 11/19/21   PLANNED INTERVENTIONS: Therapeutic exercises, Therapeutic activity, Neuromuscular re-education, Balance training, Gait training, Patient/Family education, Self Care, Joint mobilization, Joint manipulation, Stair training, DME instructions, Aquatic Therapy, Dry Needling, Electrical stimulation, Cryotherapy, Moist heat, Taping, Ultrasound, Ionotophoresis 65m/ml Dexamethasone, Manual therapy, and Re-evaluation   PLAN FOR NEXT SESSION: DC at this time.        JAnderson MaltaB. Sinia Antosh, PT 11/11/21 5:31 PM   [place] [place] [address]Phone # [phone] [phone]

## 2021-11-16 ENCOUNTER — Ambulatory Visit: Payer: Medicare PPO

## 2021-11-16 ENCOUNTER — Telehealth: Payer: Self-pay

## 2021-11-16 NOTE — Telephone Encounter (Signed)
Patient did not show.  Called both patient and contact. No answer and no option to leave voicemail.

## 2021-11-18 ENCOUNTER — Ambulatory Visit: Payer: Medicare PPO

## 2022-01-06 ENCOUNTER — Ambulatory Visit: Payer: Medicare PPO | Admitting: Emergency Medicine

## 2022-01-06 ENCOUNTER — Encounter: Payer: Self-pay | Admitting: Emergency Medicine

## 2022-01-06 VITALS — BP 134/80 | HR 90 | Temp 98.2°F | Ht 67.5 in | Wt 228.1 lb

## 2022-01-06 DIAGNOSIS — R6889 Other general symptoms and signs: Secondary | ICD-10-CM

## 2022-01-06 DIAGNOSIS — I10 Essential (primary) hypertension: Secondary | ICD-10-CM | POA: Diagnosis not present

## 2022-01-06 DIAGNOSIS — R051 Acute cough: Secondary | ICD-10-CM

## 2022-01-06 DIAGNOSIS — J22 Unspecified acute lower respiratory infection: Secondary | ICD-10-CM | POA: Diagnosis not present

## 2022-01-06 LAB — POCT RESPIRATORY SYNCYTIAL VIRUS: RSV Rapid Ag: NEGATIVE

## 2022-01-06 LAB — POCT INFLUENZA A/B
Influenza A, POC: NEGATIVE
Influenza B, POC: NEGATIVE

## 2022-01-06 LAB — POC COVID19 BINAXNOW: SARS Coronavirus 2 Ag: NEGATIVE

## 2022-01-06 MED ORDER — AZITHROMYCIN 250 MG PO TABS: ORAL_TABLET | ORAL | 0 refills | Status: DC

## 2022-01-06 NOTE — Progress Notes (Signed)
Daisy Bryant 89 y.o.   Chief Complaint  Patient presents with   Acute Visit    Sore throat, fever, headache, cough, congestion     HISTORY OF PRESENT ILLNESS: This is a 89 y.o. female complaining of flulike symptoms that started last Monday about 4 days ago.  Slowly starting to feel a little better but still has cough and congestion Other family members also sick with the same. No other associated symptoms. No other complaints or medical concerns today.  HPI   Prior to Admission medications   Medication Sig Start Date End Date Taking? Authorizing Provider  amitriptyline (ELAVIL) 25 MG tablet Take 2 tablets (50 mg total) by mouth at bedtime. 07/26/21  Yes Hoyt Koch, MD  Cholecalciferol (VITAMIN D3) 1000 units CAPS Take by mouth.   Yes [provider]  citalopram (CELEXA) 20 MG tablet Take 1 tablet (20 mg total) by mouth daily. 07/26/21  Yes Hoyt Koch, MD  [facility] VITAMIN B12 1000 MCG tablet TAKE 1 TABLET BY MOUTH EVERY DAY 07/10/19  Yes Hoyt Koch, MD  hydrochlorothiazide (HYDRODIURIL) 25 MG tablet Take 1 tablet (25 mg total) by mouth daily. 07/26/21  Yes Hoyt Koch, MD  levothyroxine (SYNTHROID) 50 MCG tablet Take 1 tablet (50 mcg total) by mouth daily before breakfast. 07/26/21  Yes Hoyt Koch, MD  Probiotic Product (PROBIOTIC DAILY PO) Take by mouth daily.   Yes [provider]  simvastatin (ZOCOR) 20 MG tablet TAKE 1 TABLET BY MOUTH EVERY DAY IN THE EVENING 07/26/21  Yes Hoyt Koch, MD    Allergies  Allergen Reactions   Esomeprazole Magnesium     REACTION: causes diarrhea   Iodine     REACTION: itching    Patient Active Problem List   Diagnosis Date Noted   Edema of foot 09/21/2020   Routine general medical examination at a health care facility 01/05/2018   B12 deficiency 10/06/2015   ANEMIA, IRON DEFICIENCY 04/22/2008   PEPTIC STRICTURE 04/17/2008   CELIAC DISEASE 09/19/2007    ADENOCARCINOMA, BREAST, RIGHT 03/13/2007   Hypothyroidism 03/13/2007   Hyperlipidemia 03/13/2007   Depression 03/13/2007   PERIPHERAL NEUROPATHY 03/13/2007   Essential hypertension 03/13/2007   GASTROESOPHAGEAL REFLUX DISEASE 03/13/2007   Osteoarthritis 03/13/2007    Past Medical History:  Diagnosis Date   Acute gastric ulcer without mention of hemorrhage, perforation, or obstruction 2002   Anemia    Anxiety    Arthritis    Cancer ([place])    CAP (community acquired pneumonia) 07/04/2019   Colon polyp 2010   HYPERPLASTIC POLYP   Depression    Diverticulosis of colon (without mention of hemorrhage)    GERD (gastroesophageal reflux disease)    H/O hiatal hernia 2002   Hypertension    Hypothyroidism    Neuromuscular disorder ([place])    Stricture and stenosis of esophagus 2002   Tuberculosis     Past Surgical History:  Procedure Laterality Date   BREAST SURGERY     ENTEROSCOPY  11/09/2011   Procedure: ENTEROSCOPY;  Surgeon: Inda Castle, MD;  Location: [facility];  Service: Endoscopy;  Laterality: N/A;   EYE SURGERY     JOINT REPLACEMENT Left    knee   MASTECTOMY     TONSILLECTOMY  under age 76    Social History   Socioeconomic History   Marital status: Widowed    Spouse name: Not on file   Number of children: 4   Years of education: 82  Highest education level: Not on file  Occupational History   Occupation: counseling -retired  Tobacco Use   Smoking status: Never   Smokeless tobacco: Never  Vaping Use   Vaping Use: Never used  Substance and Sexual Activity   Alcohol use: Yes    Alcohol/week: 1.0 standard drink of alcohol    Types: 1 drink(s) per week    Comment: wine   Drug use: No   Sexual activity: Never  Other Topics Concern   Not on file  Social History Narrative   [employer], [employer] 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 Married '55 - 2 years/divorced, married '65 - 2007/widowed. 2 sons - '55, '69; 2 dtr - '56, '68; 3  grandchildren. LIves alone and is independent all ADLs.  ACP- no CPR,  OK for short term mechanical ventilation, no heroic or futile measure in the face of irreversible disease.    Social Determinants of Health   Financial Resource Strain: Low Risk  (06/12/2017)   Overall Financial Resource Strain (CARDIA)    Difficulty of Paying Living Expenses: Not hard at all  Food Insecurity: No Food Insecurity (06/12/2017)   Hunger Vital Sign    Worried About Running Out of Food in the Last Year: Never true    Ran Out of Food in the Last Year: Never true  Transportation Needs: No Transportation Needs (06/12/2017)   PRAPARE - Hydrologist (Medical): No    Lack of Transportation (Non-Medical): No  Physical Activity: Inactive (06/12/2017)   Exercise Vital Sign    Days of Exercise per Week: 0 days    Minutes of Exercise per Session: 0 min  Stress: No Stress Concern Present (06/12/2017)   [place]    Feeling of Stress : Not at all  Social Connections: Moderately Integrated (06/12/2017)   Social Connection and Isolation Panel [NHANES]    Frequency of Communication with Friends and Family: More than three times a week    Frequency of Social Gatherings with Friends and Family: More than three times a week    Attends Religious Services: More than 4 times per year    Active Member of [employer] or Organizations: Yes    Attends Archivist Meetings: More than 4 times per year    Marital Status: Widowed  Human resources officer Violence: Not on file    Family History  Problem Relation Age of Onset   Pancreatic cancer Father      Review of Systems  Constitutional:  Positive for fever.  HENT:  Positive for sore throat.   Respiratory:  Positive for cough. Negative for shortness of breath.   Cardiovascular: Negative.  Negative for chest pain and palpitations.  Gastrointestinal:  Negative for abdominal pain,  diarrhea, nausea and vomiting.  Musculoskeletal:  Positive for myalgias.  Skin: Negative.  Negative for rash.  Neurological:  Positive for headaches.    Today's Vitals   01/06/22 1451  BP: 134/80  Pulse: 90  Temp: 98.2 F (36.8 C)  TempSrc: Oral  SpO2: 95%  Weight: 228 lb 2 oz (103.5 kg)  Height: 5' 7.5" (1.715 m)   Body mass index is 35.2 kg/m.  Physical Exam Vitals reviewed.  Constitutional:      Appearance: Normal appearance. She is obese.  HENT:     Mouth/Throat:     Mouth: Mucous membranes are moist.     Pharynx: Oropharynx is clear.  Eyes:  Extraocular Movements: Extraocular movements intact.     Conjunctiva/sclera: Conjunctivae normal.     Pupils: Pupils are equal, round, and reactive to light.  Cardiovascular:     Rate and Rhythm: Normal rate and regular rhythm.     Pulses: Normal pulses.     Heart sounds: Normal heart sounds.  Pulmonary:     Effort: Pulmonary effort is normal.     Breath sounds: Normal breath sounds.  Musculoskeletal:     Cervical back: No tenderness.  Lymphadenopathy:     Cervical: No cervical adenopathy.  Skin:    General: Skin is warm and dry.  Neurological:     General: No focal deficit present.     Mental Status: She is alert and oriented to person, place, and time.  Psychiatric:        Mood and Affect: Mood normal.        Behavior: Behavior normal.    Results for orders placed or performed in visit on 01/06/22 (from the past 24 hour(s))  POCT Influenza A/B     Status: Normal   Collection Time: 01/06/22  3:15 PM  Result Value Ref Range   Influenza A, POC Negative Negative   Influenza B, POC Negative Negative  POC COVID-19     Status: Normal   Collection Time: 01/06/22  3:15 PM  Result Value Ref Range   SARS Coronavirus 2 Ag Negative Negative  POCT respiratory syncytial virus     Status: Normal   Collection Time: 01/06/22  3:16 PM  Result Value Ref Range   RSV Rapid Ag negative      ASSESSMENT & PLAN: A total of  42 minutes was spent with the patient and counseling/coordination of care regarding preparing for this visit, review of most recent office visit notes, review of available medical records, review of multiple chronic medical conditions under management, review of all medications, diagnosis of lower respiratory infection and need for antibiotics, cough management, prognosis, documentation, and need for follow-up if no better or worse during the next several days..  Problem List Items Addressed This Visit       Cardiovascular and Mediastinum   Essential hypertension    Well-controlled hypertension. Continue hydrochlorothiazide 25 mg BP Readings from Last 3 Encounters:  01/06/22 134/80  09/15/21 108/70  09/09/21 108/70          Respiratory   Lower respiratory infection - Primary    Viral respiratory infection now with secondary bacterial infection.  Recommend daily azithromycin for 5 days. Advised to rest and stay well-hydrated. Pneumonia precautions given.      Relevant Medications   azithromycin (ZITHROMAX) 250 MG tablet     Other   Acute cough    Continue over-the-counter Mucinex DM and cough drops NyQuil at bedtime Advised to rest and stay well-hydrated.      Flu-like symptoms    Advised to rest and stay well-hydrated Recommend Tylenol every 4-6 hours as needed      Relevant Orders   POCT Influenza A/B (Completed)   POCT respiratory syncytial virus (Completed)   POC COVID-19 (Completed)   Patient Instructions  Viral Respiratory Infection A viral respiratory infection is an illness that affects parts of the body that are used for breathing. These include the lungs, nose, and throat. It is caused by a germ called a virus. Some examples of this kind of infection are: A cold. The flu (influenza). A respiratory syncytial virus (RSV) infection. What are the causes? This condition is caused by a  virus. It spreads from person to person. You can get the virus if: You  breathe in droplets from someone who is sick. You come in contact with people who are sick. You touch mucus or other fluid from a person who is sick. What are the signs or symptoms? Symptoms of this condition include: A stuffy or runny nose. A sore throat. A cough. Shortness of breath. Trouble breathing. Yellow or green fluid in the nose. Other symptoms may include: A fever. Sweating or chills. Tiredness (fatigue). Achy muscles. A headache. How is this treated? This condition may be treated with: Medicines that treat viruses. Medicines that make it easy to breathe. Medicines that are sprayed into the nose. Acetaminophen or NSAIDs, such as ibuprofen, to treat fever. Follow these instructions at home: Managing pain and congestion Take over-the-counter and prescription medicines only as told by your doctor. If you have a sore throat, gargle with salt water. Do this 3-4 times a day or as needed. To make salt water, dissolve -1 tsp (3-6 g) of salt in 1 cup (237 mL) of warm water. Make sure that all the salt dissolves. Use nose drops made from salt water. This helps with stuffiness (congestion). It also helps soften the skin around your nose. Take 2 tsp (10 mL) of honey at bedtime to lessen coughing at night. Do not give honey to children who are younger than 52 year old. Drink enough fluid to keep your pee (urine) pale yellow. General instructions  Rest as much as possible. Do not drink alcohol. Do not smoke or use any products that contain nicotine or tobacco. If you need help quitting, ask your doctor. Keep all follow-up visits. How is this prevented?     Get a flu shot every year. Ask your doctor when you should get your flu shot. Do not let other people get your germs. If you are sick: Wash your hands with soap and water often. Wash your hands after you cough or sneeze. Wash hands for at least 20 seconds. If you cannot use soap and water, use hand sanitizer. Cover your  mouth when you cough. Cover your nose and mouth when you sneeze. Do not share cups or eating utensils. Clean commonly used objects often. Clean commonly touched surfaces. Stay home from work or school. Avoid contact with people who are sick during cold and flu season. This is in fall and winter. Get help if: Your symptoms last for 10 days or longer. Your symptoms get worse over time. You have very bad pain in your face or forehead. Parts of your jaw or neck get very swollen. You have shortness of breath. Get help right away if: You feel pain or pressure in your chest. You have trouble breathing. You faint or feel like you will faint. You keep vomiting and it gets worse. You feel confused. These symptoms may be an emergency. Get help right away. Call your local emergency services (911 in the [place] Do not wait to see if the symptoms will go away. Do not drive yourself to the hospital. Summary A viral respiratory infection is an illness that affects parts of the body that are used for breathing. Examples of this illness include a cold, the flu, and a respiratory syncytial virus (RSV) infection. The infection can cause a runny nose, cough, sore throat, and fever. Follow what your doctor tells you about taking medicines, drinking lots of fluid, washing your hands, resting at home, and avoiding people who are sick. This information is  not intended to replace advice given to you by your health care provider. Make sure you discuss any questions you have with your health care provider. Document Revised: 03/26/2020 Document Reviewed: 03/26/2020 Elsevier Patient Education  [place], MD [facility]

## 2022-01-06 NOTE — Assessment & Plan Note (Addendum)
Viral respiratory infection now with secondary bacterial infection.  Recommend daily azithromycin for 5 days. Advised to rest and stay well-hydrated. Pneumonia precautions given.

## 2022-01-06 NOTE — Assessment & Plan Note (Signed)
Well-controlled hypertension. Continue hydrochlorothiazide 25 mg BP Readings from Last 3 Encounters:  01/06/22 134/80  09/15/21 108/70  09/09/21 108/70

## 2022-01-06 NOTE — Assessment & Plan Note (Signed)
Continue over-the-counter Mucinex DM and cough drops NyQuil at bedtime Advised to rest and stay well-hydrated.

## 2022-01-06 NOTE — Patient Instructions (Signed)

## 2022-01-06 NOTE — Assessment & Plan Note (Signed)
Advised to rest and stay well-hydrated Recommend Tylenol every 4-6 hours as needed

## 2022-04-11 DIAGNOSIS — H31001 Unspecified chorioretinal scars, right eye: Secondary | ICD-10-CM | POA: Diagnosis not present

## 2022-04-11 DIAGNOSIS — H52203 Unspecified astigmatism, bilateral: Secondary | ICD-10-CM | POA: Diagnosis not present

## 2022-06-16 DIAGNOSIS — H903 Sensorineural hearing loss, bilateral: Secondary | ICD-10-CM | POA: Diagnosis not present

## 2022-06-29 ENCOUNTER — Ambulatory Visit (INDEPENDENT_AMBULATORY_CARE_PROVIDER_SITE_OTHER): Payer: Medicare PPO

## 2022-06-29 DIAGNOSIS — Z0000 Encounter for general adult medical examination without abnormal findings: Secondary | ICD-10-CM | POA: Diagnosis not present

## 2022-06-29 NOTE — Progress Notes (Signed)
Subjective:   Daisy Bryant is a 89 y.o. female who presents for Medicare Annual (Subsequent) preventive examination.  Visit Complete: Virtual  I connected with  Daisy Bryant on 06/29/22 by a audio enabled telemedicine application and verified that I am speaking with the correct person using two identifiers.  Patient Location: Home  Provider Location: Home Office  I discussed the limitations of evaluation and management by telemedicine. The patient expressed understanding and agreed to proceed.  Patient Medicare AWV questionnaire was completed by the patient on 06/29/2022; I have confirmed that all information answered by patient is correct and no changes since this date.   Cardiac Risk Factors include: advanced age (>59men, >73 women);hypertension     Objective:    Today's Vitals   There is no height or weight on file to calculate BMI.     06/29/2022   11:04 AM 08/02/2021    2:56 PM 06/20/2019    1:31 PM 06/14/2018    2:18 PM 06/12/2017    2:35 PM 11/08/2011   12:16 AM  Advanced Directives  Does Patient Have a Medical Advance Directive? Yes Yes Yes Yes Yes Patient has advance directive, copy not in chart  Type of Advance Directive Healthcare Power of [place] ;Living will Healthcare Power of [place];Living will  Healthcare Power of [place];Living will Healthcare Power of [place];Living will Healthcare Power of [place];Living will  Does patient want to make changes to medical advance directive?  No - Patient declined No - Patient declined     Copy of Healthcare Power of Attorney in Chart? No - copy requested No - copy requested  No - copy requested No - copy requested Copy requested from family  Pre-existing out of facility DNR order (yellow form or pink MOST form)      No    Current Medications (verified) Outpatient Encounter Medications as of 06/29/2022  Medication Sig   amitriptyline (ELAVIL) 25 MG tablet Take 2 tablets (50 mg total) by mouth at bedtime.    azithromycin (ZITHROMAX) 250 MG tablet Sig as indicated   Cholecalciferol (VITAMIN D3) 1000 units CAPS Take by mouth.   citalopram (CELEXA) 20 MG tablet Take 1 tablet (20 mg total) by mouth daily.   [facility] VITAMIN B12 1000 MCG tablet TAKE 1 TABLET BY MOUTH EVERY DAY   hydrochlorothiazide (HYDRODIURIL) 25 MG tablet Take 1 tablet (25 mg total) by mouth daily.   levothyroxine (SYNTHROID) 50 MCG tablet Take 1 tablet (50 mcg total) by mouth daily before breakfast.   Probiotic Product (PROBIOTIC DAILY PO) Take by mouth daily.   simvastatin (ZOCOR) 20 MG tablet TAKE 1 TABLET BY MOUTH EVERY DAY IN THE EVENING   No facility-administered encounter medications on file as of 06/29/2022.    Allergies (verified) Esomeprazole magnesium and Iodine   History: Past Medical History:  Diagnosis Date   Acute gastric ulcer without mention of hemorrhage, perforation, or obstruction 2002   Anemia    Anxiety    Arthritis    Cancer (HCC)    CAP (community acquired pneumonia) 07/04/2019   Colon polyp 2010   HYPERPLASTIC POLYP   Depression    Diverticulosis of colon (without mention of hemorrhage)    GERD (gastroesophageal reflux disease)    H/O hiatal hernia 2002   Hypertension    Hypothyroidism    Neuromuscular disorder (HCC)    Stricture and stenosis of esophagus 2002   Tuberculosis    Past Surgical History:  Procedure Laterality Date   BREAST SURGERY  ENTEROSCOPY  11/09/2011   Procedure: ENTEROSCOPY;  Surgeon: Louis Meckel, MD;  Location: [facility];  Service: Endoscopy;  Laterality: N/A;   EYE SURGERY     JOINT REPLACEMENT Left    knee   MASTECTOMY     TONSILLECTOMY  under age 48   Family History  Problem Relation Age of Onset   Pancreatic cancer Father    Social History   Socioeconomic History   Marital status: Widowed    Spouse name: Not on file   Number of children: 4   Years of education: 61   Highest education level: Not on file  Occupational History   Occupation:  counseling -retired  Tobacco Use   Smoking status: Never   Smokeless tobacco: Never  Vaping Use   Vaping Use: Never used  Substance and Sexual Activity   Alcohol use: Yes    Alcohol/week: 1.0 standard drink of alcohol    Types: 1 drink(s) per week    Comment: wine   Drug use: No   Sexual activity: Never  Other Topics Concern   Not on file  Social History Narrative   [employer], [employer] 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 Married '55 - 2 years/divorced, married '65 - 2007/widowed. 2 sons - '55, '69; 2 dtr - '56, '68; 3 grandchildren. LIves alone and is independent all ADLs.  ACP- no CPR,  OK for short term mechanical ventilation, no heroic or futile measure in the face of irreversible disease.    Social Determinants of Health   Financial Resource Strain: Low Risk  (06/12/2017)   Overall Financial Resource Strain (CARDIA)    Difficulty of Paying Living Expenses: Not hard at all  Food Insecurity: No Food Insecurity (06/12/2017)   Hunger Vital Sign    Worried About Running Out of Food in the Last Year: Never true    Ran Out of Food in the Last Year: Never true  Transportation Needs: No Transportation Needs (06/12/2017)   PRAPARE - Administrator, Civil Service (Medical): No    Lack of Transportation (Non-Medical): No  Physical Activity: Inactive (06/12/2017)   Exercise Vital Sign    Days of Exercise per Week: 0 days    Minutes of Exercise per Session: 0 min  Stress: No Stress Concern Present (06/12/2017)   [employer] of Occupational Health - Occupational Stress Questionnaire    Feeling of Stress : Not at all  Social Connections: Moderately Integrated (06/12/2017)   Social Connection and Isolation Panel [NHANES]    Frequency of Communication with Friends and Family: More than three times a week    Frequency of Social Gatherings with Friends and Family: More than three times a week    Attends Religious Services: More than 4 times per year     Active Member of [employer] or Organizations: Yes    Attends Banker Meetings: More than 4 times per year    Marital Status: Widowed    Tobacco Counseling Counseling given: Not Answered   Clinical Intake:  Pre-visit preparation completed: No  Pain : No/denies pain     Diabetes: No  How often do you need to have someone help you when you read instructions, pamphlets, or other written materials from your doctor or pharmacy?: 1 - Never What is the last grade level you completed in school?: graduate school  Interpreter Needed?: No      Activities of Daily Living    06/29/2022   11:02  AM  In your present state of health, do you have any difficulty performing the following activities:  Hearing? 1  Vision? 0  Difficulty concentrating or making decisions? 0  Walking or climbing stairs? 0  Dressing or bathing? 0  Doing errands, shopping? 0  Preparing Food and eating ? N  Using the Toilet? N  In the past six months, have you accidently leaked urine? N  Do you have problems with loss of bowel control? N  Managing your Medications? N  Managing your Finances? N  Housekeeping or managing your Housekeeping? N    Patient Care Team: Myrlene Broker, MD as PCP - General (Internal Medicine)  Indicate any recent Medical Services you may have received from other than [facility] providers in the past year (date may be approximate).     Assessment:   This is a routine wellness examination for Jilda.  Hearing/Vision screen No results found.  Dietary issues and exercise activities discussed:     Goals Addressed             This Visit's Progress    to reach 58 like my grandmother        Depression Screen    01/06/2022    2:52 PM 07/26/2021    1:56 PM 06/16/2020    2:53 PM 06/20/2019    1:32 PM 04/29/2019   11:10 AM 06/14/2018    2:19 PM 06/12/2017    2:36 PM  PHQ 2/9 Scores  PHQ - 2 Score 0 0 0 0 0 0 0  PHQ- 9 Score 0 0   0 0 0    Fall Risk    06/29/2022    11:04 AM 01/06/2022    2:51 PM 07/26/2021    1:56 PM 06/16/2020    2:52 PM 06/20/2019    1:31 PM  Fall Risk   Falls in the past year? 0 0 1 1 0  Number falls in past yr: 0 0 0 0 0  Injury with Fall? 0 0 0 0 0  Risk for fall due to : No Fall Risks Impaired balance/gait History of fall(s);Impaired balance/gait No Fall Risks No Fall Risks  Follow up Falls evaluation completed Falls evaluation completed Falls evaluation completed Falls evaluation completed Falls evaluation completed;Education provided    MEDICARE RISK AT HOME:  Medicare Risk at Home - 06/29/22 1105     Any stairs in or around the home? Yes    If so, are there any without handrails? No    Home free of loose throw rugs in walkways, pet beds, electrical cords, etc? Yes    Adequate lighting in your home to reduce risk of falls? Yes    Life alert? No    Use of a cane, walker or w/c? Yes    Grab bars in the bathroom? Yes    Shower chair or bench in shower? No    Elevated toilet seat or a handicapped toilet? Yes             TIMED UP AND GO:  Was the test performed?  No    Cognitive Function:    06/12/2017    3:06 PM  MMSE - Mini Mental State Exam  Orientation to time 5  Orientation to Place 5  Registration 3  Attention/ Calculation 5  Recall 2  Language- name 2 objects 2  Language- repeat 1  Language- follow 3 step command 3  Language- read & follow direction 1  Write a sentence 1  Copy  design 1  Total score 29        06/29/2022   11:06 AM 06/20/2019    1:33 PM  6CIT Screen  What Year? 0 points 0 points  What month? 0 points 0 points  What time? 0 points 0 points  Count back from 20 0 points 0 points  Months in reverse 0 points 0 points  Repeat phrase 0 points 2 points  Total Score 0 points 2 points    Immunizations Immunization History  Administered Date(s) Administered   Fluad Quad(high Dose 65+) 10/29/2018, 10/11/2021   Influenza Whole 09/19/2007, 10/04/2008, 09/24/2009, 11/23/2011    Influenza, High Dose Seasonal PF 10/24/2016, 09/05/2017, 11/17/2020   Influenza,inj,Quad PF,6+ Mos 09/03/2013, 10/09/2014, 08/24/2015   Influenza-Unspecified 10/03/2012   Moderna Covid-19 Vaccine Bivalent Booster 31yrs & up 10/14/2020   Moderna Sars-Covid-2 Vaccination 01/15/2019, 02/12/2019, 10/28/2019, 04/09/2020   PNEUMOCOCCAL CONJUGATE-20 12/08/2020   Pneumococcal Conjugate-13 10/09/2014   Pneumococcal Polysaccharide-23 02/13/2004, 09/24/2009   Respiratory Syncytial Virus Vaccine,Recomb Aduvanted(Arexvy) 11/16/2021   Tdap 04/01/2015   Zoster Recombinat (Shingrix) 06/29/2021, 10/11/2021    TDAP status: Up to date  Flu Vaccine status: Up to date  Pneumococcal vaccine status: Up to date  Covid-19 vaccine status: Completed vaccines  Qualifies for Shingles Vaccine? Yes   Zostavax completed Yes   Shingrix Completed?: Yes  Screening Tests Health Maintenance  Topic Date Due   DEXA SCAN  Never done   COVID-19 Vaccine (6 - 2023-24 season) 09/03/2021   INFLUENZA VACCINE  08/04/2022   Medicare Annual Wellness (AWV)  06/29/2023   DTaP/Tdap/Td (2 - Td or Tdap) 03/31/2025   Pneumonia Vaccine 69+ Years old  Completed   Zoster Vaccines- Shingrix  Completed   HPV VACCINES  Aged Out    Health Maintenance  Health Maintenance Due  Topic Date Due   DEXA SCAN  Never done   COVID-19 Vaccine (6 - 2023-24 season) 09/03/2021    Colorectal cancer screening: No longer required.   Mammogram status: No longer required due to age .    Lung Cancer Screening: (Low Dose CT Chest recommended if Age 11-80 years, 20 pack-year currently smoking OR have quit w/in 15years.) does not qualify.   Lung Cancer Screening Referral: na  Additional Screening:  Hepatitis C Screening: does qualify; Completed yes   Vision Screening: Recommended annual ophthalmology exams for early detection of glaucoma and other disorders of the eye. Is the patient up to date with their annual eye exam?  Yes  Who is the  provider or what is the name of the office in which the patient attends annual eye exams? [facility] ophthalmology  If pt is not established with a provider, would they like to be referred to a provider to establish care?  Established  .   Dental Screening: Recommended annual dental exams for proper oral hygiene  Diabetic Foot Exam:   Community Resource Referral / Chronic Care Management: CRR required this visit?  No   CCM required this visit?  No     Plan:     I have personally reviewed and noted the following in the patient's chart:   Medical and social history Use of alcohol, tobacco or illicit drugs  Current medications and supplements including opioid prescriptions. Patient is not currently taking opioid prescriptions. Functional ability and status Nutritional status Physical activity Advanced directives List of other physicians Hospitalizations, surgeries, and ER visits in previous 12 months Vitals Screenings to include cognitive, depression, and falls Referrals and appointments  In addition, I have  reviewed and discussed with patient certain preventive protocols, quality metrics, and best practice recommendations. A written personalized care plan for preventive services as well as general preventive health recommendations were provided to patient.     Delana Meyer   06/29/2022   After Visit Summary: (MyChart) Due to this being a telephonic visit, the after visit summary with patients personalized plan was offered to patient via MyChart   Nurse Notes:

## 2022-07-01 NOTE — Progress Notes (Signed)
Subjective:   Daisy Bryant is a 89 y.o. female who presents for Medicare Annual (Subsequent) preventive examination.  Visit Complete: Virtual  I connected with  [name] on 06/29/2022 by a audio enabled telemedicine application and verified that I am speaking with the correct person using two identifiers.  Patient Location: Home  Provider Location: Home Office  I discussed the limitations of evaluation and management by telemedicine. The patient expressed understanding and agreed to proceed.  Patient Medicare AWV questionnaire was completed by the patient on 06/29/2022; I have confirmed that all information answered by patient is correct and no changes since this date.   Cardiac Risk Factors include: advanced age (>8men, >36 women);hypertension     Objective:    Today's Vitals   There is no height or weight on file to calculate BMI.     06/29/2022   11:04 AM 08/02/2021    2:56 PM 06/20/2019    1:31 PM 06/14/2018    2:18 PM 06/12/2017    2:35 PM 11/08/2011   12:16 AM  Advanced Directives  Does Patient Have a Medical Advance Directive? Yes Yes Yes Yes Yes Patient has advance directive, copy not in chart  Type of Advance Directive Healthcare Power of [place];Living will Healthcare Power of [place];Living will  Healthcare Power of [place];Living will Healthcare Power of [place];Living will Healthcare Power of [place];Living will  Does patient want to make changes to medical advance directive?  No - Patient declined No - Patient declined     Copy of Healthcare Power of Attorney in Chart? No - copy requested No - copy requested  No - copy requested No - copy requested Copy requested from family  Pre-existing out of facility DNR order (yellow form or pink MOST form)      No    Current Medications (verified) Outpatient Encounter Medications as of 06/29/2022  Medication Sig   amitriptyline (ELAVIL) 25 MG tablet Take 2 tablets (50 mg total) by mouth at bedtime.    azithromycin (ZITHROMAX) 250 MG tablet Sig as indicated   Cholecalciferol (VITAMIN D3) 1000 units CAPS Take by mouth.   citalopram (CELEXA) 20 MG tablet Take 1 tablet (20 mg total) by mouth daily.   [facility] VITAMIN B12 1000 MCG tablet TAKE 1 TABLET BY MOUTH EVERY DAY   hydrochlorothiazide (HYDRODIURIL) 25 MG tablet Take 1 tablet (25 mg total) by mouth daily.   levothyroxine (SYNTHROID) 50 MCG tablet Take 1 tablet (50 mcg total) by mouth daily before breakfast.   Probiotic Product (PROBIOTIC DAILY PO) Take by mouth daily.   simvastatin (ZOCOR) 20 MG tablet TAKE 1 TABLET BY MOUTH EVERY DAY IN THE EVENING   No facility-administered encounter medications on file as of 06/29/2022.    Allergies (verified) Esomeprazole magnesium and Iodine   History: Past Medical History:  Diagnosis Date   Acute gastric ulcer without mention of hemorrhage, perforation, or obstruction 2002   Anemia    Anxiety    Arthritis    Cancer (HCC)    CAP (community acquired pneumonia) 07/04/2019   Colon polyp 2010   HYPERPLASTIC POLYP   Depression    Diverticulosis of colon (without mention of hemorrhage)    GERD (gastroesophageal reflux disease)    H/O hiatal hernia 2002   Hypertension    Hypothyroidism    Neuromuscular disorder (HCC)    Stricture and stenosis of esophagus 2002   Tuberculosis    Past Surgical History:  Procedure Laterality Date   BREAST SURGERY  ENTEROSCOPY  11/09/2011   Procedure: ENTEROSCOPY;  Surgeon: Louis Meckel, MD;  Location: [facility];  Service: Endoscopy;  Laterality: N/A;   EYE SURGERY     JOINT REPLACEMENT Left    knee   MASTECTOMY     TONSILLECTOMY  under age 48   Family History  Problem Relation Age of Onset   Pancreatic cancer Father    Social History   Socioeconomic History   Marital status: Widowed    Spouse name: Not on file   Number of children: 4   Years of education: 61   Highest education level: Not on file  Occupational History   Occupation:  counseling -retired  Tobacco Use   Smoking status: Never   Smokeless tobacco: Never  Vaping Use   Vaping Use: Never used  Substance and Sexual Activity   Alcohol use: Yes    Alcohol/week: 1.0 standard drink of alcohol    Types: 1 drink(s) per week    Comment: wine   Drug use: No   Sexual activity: Never  Other Topics Concern   Not on file  Social History Narrative   [employer], [employer] 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 Married '55 - 2 years/divorced, married '65 - 2007/widowed. 2 sons - '55, '69; 2 dtr - '56, '68; 3 grandchildren. LIves alone and is independent all ADLs.  ACP- no CPR,  OK for short term mechanical ventilation, no heroic or futile measure in the face of irreversible disease.    Social Determinants of Health   Financial Resource Strain: Low Risk  (06/12/2017)   Overall Financial Resource Strain (CARDIA)    Difficulty of Paying Living Expenses: Not hard at all  Food Insecurity: No Food Insecurity (06/12/2017)   Hunger Vital Sign    Worried About Running Out of Food in the Last Year: Never true    Ran Out of Food in the Last Year: Never true  Transportation Needs: No Transportation Needs (06/12/2017)   PRAPARE - Administrator, Civil Service (Medical): No    Lack of Transportation (Non-Medical): No  Physical Activity: Inactive (06/12/2017)   Exercise Vital Sign    Days of Exercise per Week: 0 days    Minutes of Exercise per Session: 0 min  Stress: No Stress Concern Present (06/12/2017)   [employer] of Occupational Health - Occupational Stress Questionnaire    Feeling of Stress : Not at all  Social Connections: Moderately Integrated (06/12/2017)   Social Connection and Isolation Panel [NHANES]    Frequency of Communication with Friends and Family: More than three times a week    Frequency of Social Gatherings with Friends and Family: More than three times a week    Attends Religious Services: More than 4 times per year     Active Member of [employer] or Organizations: Yes    Attends Banker Meetings: More than 4 times per year    Marital Status: Widowed    Tobacco Counseling Counseling given: Not Answered   Clinical Intake:  Pre-visit preparation completed: No  Pain : No/denies pain     Diabetes: No  How often do you need to have someone help you when you read instructions, pamphlets, or other written materials from your doctor or pharmacy?: 1 - Never What is the last grade level you completed in school?: graduate school  Interpreter Needed?: No      Activities of Daily Living    06/29/2022   11:02  AM  In your present state of health, do you have any difficulty performing the following activities:  Hearing? 1  Vision? 0  Difficulty concentrating or making decisions? 0  Walking or climbing stairs? 0  Dressing or bathing? 0  Doing errands, shopping? 0  Preparing Food and eating ? N  Using the Toilet? N  In the past six months, have you accidently leaked urine? N  Do you have problems with loss of bowel control? N  Managing your Medications? N  Managing your Finances? N  Housekeeping or managing your Housekeeping? N    Patient Care Team: Myrlene Broker, MD as PCP - General (Internal Medicine)  Indicate any recent Medical Services you may have received from other than [facility] providers in the past year (date may be approximate).     Assessment:   This is a routine wellness examination for Rodneshia.  Hearing/Vision screen No results found.  Dietary issues and exercise activities discussed:     Goals Addressed             This Visit's Progress    to reach 61 like my grandmother        Depression Screen    07/01/2022    7:16 PM 01/06/2022    2:52 PM 07/26/2021    1:56 PM 06/16/2020    2:53 PM 06/20/2019    1:32 PM 04/29/2019   11:10 AM 06/14/2018    2:19 PM  PHQ 2/9 Scores  PHQ - 2 Score 0 0 0 0 0 0 0  PHQ- 9 Score 0 0 0   0 0    Fall Risk    06/29/2022    11:04 AM 01/06/2022    2:51 PM 07/26/2021    1:56 PM 06/16/2020    2:52 PM 06/20/2019    1:31 PM  Fall Risk   Falls in the past year? 0 0 1 1 0  Number falls in past yr: 0 0 0 0 0  Injury with Fall? 0 0 0 0 0  Risk for fall due to : No Fall Risks Impaired balance/gait History of fall(s);Impaired balance/gait No Fall Risks No Fall Risks  Follow up Falls evaluation completed Falls evaluation completed Falls evaluation completed Falls evaluation completed Falls evaluation completed;Education provided    MEDICARE RISK AT HOME:    TIMED UP AND GO:  Was the test performed?  No    Cognitive Function:    06/12/2017    3:06 PM  MMSE - Mini Mental State Exam  Orientation to time 5  Orientation to Place 5  Registration 3  Attention/ Calculation 5  Recall 2  Language- name 2 objects 2  Language- repeat 1  Language- follow 3 step command 3  Language- read & follow direction 1  Write a sentence 1  Copy design 1  Total score 29        06/29/2022   11:06 AM 06/20/2019    1:33 PM  6CIT Screen  What Year? 0 points 0 points  What month? 0 points 0 points  What time? 0 points 0 points  Count back from 20 0 points 0 points  Months in reverse 0 points 0 points  Repeat phrase 0 points 2 points  Total Score 0 points 2 points    Immunizations Immunization History  Administered Date(s) Administered   Fluad Quad(high Dose 65+) 10/29/2018, 10/11/2021   Influenza Whole 09/19/2007, 10/04/2008, 09/24/2009, 11/23/2011   Influenza, High Dose Seasonal PF 10/24/2016, 09/05/2017, 11/17/2020   Influenza,inj,Quad  PF,6+ Mos 09/03/2013, 10/09/2014, 08/24/2015   Influenza-Unspecified 10/03/2012   Moderna Covid-19 Vaccine Bivalent Booster 69yrs & up 10/14/2020   Moderna Sars-Covid-2 Vaccination 01/15/2019, 02/12/2019, 10/28/2019, 04/09/2020   PNEUMOCOCCAL CONJUGATE-20 12/08/2020   Pneumococcal Conjugate-13 10/09/2014   Pneumococcal Polysaccharide-23 02/13/2004, 09/24/2009   Respiratory Syncytial  Virus Vaccine,Recomb Aduvanted(Arexvy) 11/16/2021   Tdap 04/01/2015   Zoster Recombinant(Shingrix) 06/29/2021, 10/11/2021    TDAP status: Up to date  Flu Vaccine status: Up to date  Pneumococcal vaccine status: Up to date  Covid-19 vaccine status: Completed vaccines  Qualifies for Shingles Vaccine? Yes   Zostavax completed Yes   Shingrix Completed?: Yes  Screening Tests Health Maintenance  Topic Date Due   DEXA SCAN  Never done   COVID-19 Vaccine (6 - 2023-24 season) 09/03/2021   INFLUENZA VACCINE  08/04/2022   Medicare Annual Wellness (AWV)  06/29/2023   DTaP/Tdap/Td (2 - Td or Tdap) 03/31/2025   Pneumonia Vaccine 22+ Years old  Completed   Zoster Vaccines- Shingrix  Completed   HPV VACCINES  Aged Out    Health Maintenance  Health Maintenance Due  Topic Date Due   DEXA SCAN  Never done   COVID-19 Vaccine (6 - 2023-24 season) 09/03/2021    Colorectal cancer screening: No longer required.   Mammogram status: No longer required due to age .    Lung Cancer Screening: (Low Dose CT Chest recommended if Age 26-80 years, 20 pack-year currently smoking OR have quit w/in 15years.) does not qualify.   Lung Cancer Screening Referral: na  Additional Screening:  Hepatitis C Screening: does qualify; Completed yes   Vision Screening: Recommended annual ophthalmology exams for early detection of glaucoma and other disorders of the eye. Is the patient up to date with their annual eye exam?  Yes  Who is the provider or what is the name of the office in which the patient attends annual eye exams? [facility] ophthalmology  If pt is not established with a provider, would they like to be referred to a provider to establish care?  Established  .   Dental Screening: Recommended annual dental exams for proper oral hygiene  Diabetic Foot Exam:   Community Resource Referral / Chronic Care Management: CRR required this visit?  No   CCM required this visit?  No     Plan:      I have personally reviewed and noted the following in the patient's chart:   Medical and social history Use of alcohol, tobacco or illicit drugs  Current medications and supplements including opioid prescriptions. Patient is not currently taking opioid prescriptions. Functional ability and status Nutritional status Physical activity Advanced directives List of other physicians Hospitalizations, surgeries, and ER visits in previous 12 months Vitals Screenings to include cognitive, depression, and falls Referrals and appointments  In addition, I have reviewed and discussed with patient certain preventive protocols, quality metrics, and best practice recommendations. A written personalized care plan for preventive services as well as general preventive health recommendations were provided to patient.     Delana Meyer   06/29/2022   After Visit Summary: (MyChart) Due to this being a telephonic visit, the after visit summary with patients personalized plan was offered to patient via MyChart   Nurse Notes:

## 2022-07-12 ENCOUNTER — Other Ambulatory Visit: Payer: Self-pay | Admitting: Internal Medicine

## 2022-08-15 DIAGNOSIS — I10 Essential (primary) hypertension: Secondary | ICD-10-CM | POA: Diagnosis not present

## 2022-08-15 DIAGNOSIS — Z8249 Family history of ischemic heart disease and other diseases of the circulatory system: Secondary | ICD-10-CM | POA: Diagnosis not present

## 2022-08-15 DIAGNOSIS — M1990 Unspecified osteoarthritis, unspecified site: Secondary | ICD-10-CM | POA: Diagnosis not present

## 2022-08-15 DIAGNOSIS — F325 Major depressive disorder, single episode, in full remission: Secondary | ICD-10-CM | POA: Diagnosis not present

## 2022-08-15 DIAGNOSIS — R269 Unspecified abnormalities of gait and mobility: Secondary | ICD-10-CM | POA: Diagnosis not present

## 2022-08-15 DIAGNOSIS — E669 Obesity, unspecified: Secondary | ICD-10-CM | POA: Diagnosis not present

## 2022-08-15 DIAGNOSIS — R600 Localized edema: Secondary | ICD-10-CM | POA: Diagnosis not present

## 2022-08-15 DIAGNOSIS — E039 Hypothyroidism, unspecified: Secondary | ICD-10-CM | POA: Diagnosis not present

## 2022-08-15 DIAGNOSIS — R32 Unspecified urinary incontinence: Secondary | ICD-10-CM | POA: Diagnosis not present

## 2022-11-29 ENCOUNTER — Telehealth: Payer: Medicare PPO | Admitting: Nurse Practitioner

## 2022-11-29 DIAGNOSIS — J069 Acute upper respiratory infection, unspecified: Secondary | ICD-10-CM

## 2022-11-29 MED ORDER — BENZONATATE 100 MG PO CAPS: 100.0000 mg | ORAL_CAPSULE | Freq: Three times a day (TID) | ORAL | 0 refills | Status: AC | PRN

## 2022-11-29 MED ORDER — FLUTICASONE PROPIONATE 50 MCG/ACT NA SUSP: 2.0000 | Freq: Every day | NASAL | 6 refills | Status: AC

## 2022-11-29 NOTE — Progress Notes (Signed)
Virtual Visit Consent   [name], you are scheduled for a virtual visit with a [facility] provider today. Just as with appointments in the office, your consent must be obtained to participate. Your consent will be active for this visit and any virtual visit you may have with one of our providers in the next 365 days. If you have a MyChart account, a copy of this consent can be sent to you electronically.  As this is a virtual visit, video technology does not allow for your provider to perform a traditional examination. This may limit your provider's ability to fully assess your condition. If your provider identifies any concerns that need to be evaluated in person or the need to arrange testing (such as labs, EKG, etc.), we will make arrangements to do so. Although advances in technology are sophisticated, we cannot ensure that it will always work on either your end or our end. If the connection with a video visit is poor, the visit may have to be switched to a telephone visit. With either a video or telephone visit, we are not always able to ensure that we have a secure connection.  By engaging in this virtual visit, you consent to the provision of healthcare and authorize for your insurance to be billed (if applicable) for the services provided during this visit. Depending on your insurance coverage, you may receive a charge related to this service.  I need to obtain your verbal consent now. Are you willing to proceed with your visit today? Daisy Bryant has provided verbal consent on 11/29/2022 for a virtual visit (video or telephone). Viviano Simas, FNP  Date: 11/29/2022 6:15 PM  Virtual Visit via Video Note   I, Viviano Simas, connected with  Daisy Bryant  ([id number], November 06, 1930) on 11/29/22 at  6:15 PM EST by a video-enabled telemedicine application and verified that I am speaking with the correct person using two identifiers.  Location: Patient: Virtual Visit Location Patient:  Home Provider: Virtual Visit Location Provider: Home Office   I discussed the limitations of evaluation and management by telemedicine and the availability of in person appointments. The patient expressed understanding and agreed to proceed.    History of Present Illness: Daisy Bryant is a 89 y.o. who identifies as a female who was assigned female at birth, and is being seen today for cough and sore throat and nasal congestion.  She does have increased fatigue today denies body aches  Her symptoms started yesterday   Her temperature is elevated 2 above her normal   She did take a COVID test and that was negative   She denies a history of asthma or reactive airway  She has had pneumonia in the past but not recently   She has taken Dayquil today for relief   She does not take allergy medicine regularly   She has had her annual flu vaccine   Problems:  Patient Active Problem List   Diagnosis Date Noted   Lower respiratory infection 01/06/2022   Flu-like symptoms 01/06/2022   Edema of foot 09/21/2020   Routine general medical examination at a health care facility 01/05/2018   B12 deficiency 10/06/2015   ANEMIA, IRON DEFICIENCY 04/22/2008   PEPTIC STRICTURE 04/17/2008   CELIAC DISEASE 09/19/2007   ADENOCARCINOMA, BREAST, RIGHT 03/13/2007   Hypothyroidism 03/13/2007   Hyperlipidemia 03/13/2007   Depression 03/13/2007   PERIPHERAL NEUROPATHY 03/13/2007   Essential hypertension 03/13/2007   GASTROESOPHAGEAL REFLUX DISEASE 03/13/2007   Osteoarthritis 03/13/2007  Acute cough 03/13/2007    Allergies:  Allergies  Allergen Reactions   Esomeprazole Magnesium     REACTION: causes diarrhea   Iodine     REACTION: itching   Medications:  Current Outpatient Medications:    amitriptyline (ELAVIL) 25 MG tablet, TAKE 2 TABLETS BY MOUTH AT BEDTIME., Disp: 180 tablet, Rfl: 3   Cholecalciferol (VITAMIN D3) 1000 units CAPS, Take by mouth., Disp: , Rfl:    citalopram (CELEXA) 20 MG  tablet, TAKE 1 TABLET BY MOUTH EVERY DAY, Disp: 90 tablet, Rfl: 3   [facility]S VITAMIN B12 1000 MCG tablet, TAKE 1 TABLET BY MOUTH EVERY DAY, Disp: 30 tablet, Rfl: 5   hydrochlorothiazide (HYDRODIURIL) 25 MG tablet, TAKE 1 TABLET (25 MG TOTAL) BY MOUTH DAILY., Disp: 90 tablet, Rfl: 3   levothyroxine (SYNTHROID) 50 MCG tablet, TAKE 1 TABLET BY MOUTH DAILY BEFORE BREAKFAST, Disp: 90 tablet, Rfl: 3   simvastatin (ZOCOR) 20 MG tablet, TAKE 1 TABLET BY MOUTH EVERY DAY IN THE EVENING, Disp: 90 tablet, Rfl: 3    Observations/Objective: Patient is well-developed, well-nourished in no acute distress.  Resting comfortably  at home.  Head is normocephalic, atraumatic.  No labored breathing.  Speech is clear and coherent with logical content.  Patient is alert and oriented at baseline.    Assessment and Plan:  1. Viral upper respiratory tract infection  Push fluids, increase protein intake and continue management of congestion with OTC medications as discussed   May also add:  - benzonatate (TESSALON) 100 MG capsule; Take 1 capsule (100 mg total) by mouth 3 (three) times daily as needed.  Dispense: 30 capsule; Refill: 0  - fluticasone (FLONASE) 50 MCG/ACT nasal spray; Place 2 sprays into both nostrils daily.  Dispense: 16 g; Refill: 6    Discussed viral process with patient, when to follow up and and centrifuged diligent management of symptoms   Follow Up Instructions: I discussed the assessment and treatment plan with the patient. The patient was provided an opportunity to ask questions and all were answered. The patient agreed with the plan and demonstrated an understanding of the instructions.  A copy of instructions were sent to the patient via MyChart unless otherwise noted below.    The patient was advised to call back or seek an in-person evaluation if the symptoms worsen or if the condition fails to improve as anticipated.    Viviano Simas, FNP

## 2022-12-08 DIAGNOSIS — J069 Acute upper respiratory infection, unspecified: Secondary | ICD-10-CM | POA: Diagnosis not present

## 2022-12-08 DIAGNOSIS — R051 Acute cough: Secondary | ICD-10-CM | POA: Diagnosis not present

## 2022-12-08 DIAGNOSIS — J029 Acute pharyngitis, unspecified: Secondary | ICD-10-CM | POA: Diagnosis not present

## 2022-12-08 DIAGNOSIS — R0981 Nasal congestion: Secondary | ICD-10-CM | POA: Diagnosis not present

## 2023-01-05 ENCOUNTER — Other Ambulatory Visit: Payer: Self-pay | Admitting: Nurse Practitioner

## 2023-01-05 ENCOUNTER — Ambulatory Visit: Payer: Self-pay | Admitting: Internal Medicine

## 2023-01-05 DIAGNOSIS — Z20828 Contact with and (suspected) exposure to other viral communicable diseases: Secondary | ICD-10-CM

## 2023-01-05 NOTE — Telephone Encounter (Signed)
  Chief Complaint: exposure to flu A Symptoms: none  Additional Notes:  Patient and daughter called in stating that they were exposed to daughters husband who has Flu A and is currently hospitalized. Patient and daughter report that they do not currently have any symptoms. Daughter wants to know if she can get a prescription for Tamiflu  for patient as a precaution. This RN advised that she would forward information to appropriate person for follow-up. Patient advised to call back with symptoms. Patient and daughter verbalized understanding.    Copied from CRM [id number]. Topic: Clinical - Medical Advice >> Jan 05, 2023 11:14 AM Joanell NOVAK wrote: Reason for CRM: Pt daughter Elvie called and would like to request a callback to speak with a nurse regarding what she should do regarding her and the pt being exposed to Influenza A. Neither are experiencing any symptoms at the moment. Reason for Disposition  Health Information question, no triage required and triager able to answer question  Answer Assessment - Initial Assessment Questions 1. REASON FOR CALL or QUESTION: What is your reason for calling today? or How can I best help you? or What question do you have that I can help answer?     Patient and daughter called in stating that they were exposed to daughters husband who has Flu A. Patient and daughter report that they do not currently have any symptoms. Daughter wants to know if she can get a prescription for Tamiflu  for patient as a precaution.  Protocols used: Information Only Call - No Triage-A-AH

## 2023-01-05 NOTE — Telephone Encounter (Signed)
 Voicemail is full unable to leave VS. Both patient and daughter are not showing symptoms but would like to be prescribed tamiflu

## 2023-01-06 NOTE — Telephone Encounter (Signed)
 Would agree with BMP but since I have not seen her since 2023 I would be comfortable sending this in after labs but she needs to schedule f/u with me shortly

## 2023-01-06 NOTE — Telephone Encounter (Signed)
 Patient has been scheduled to come in to be seen and will aslo call back if she test positive for the flu

## 2023-01-17 ENCOUNTER — Ambulatory Visit: Payer: Medicare PPO | Admitting: Internal Medicine

## 2023-05-23 ENCOUNTER — Other Ambulatory Visit: Payer: Self-pay | Admitting: Nurse Practitioner

## 2023-05-23 DIAGNOSIS — R051 Acute cough: Secondary | ICD-10-CM | POA: Diagnosis not present

## 2023-05-23 DIAGNOSIS — D508 Other iron deficiency anemias: Secondary | ICD-10-CM | POA: Diagnosis not present

## 2023-05-23 DIAGNOSIS — E538 Deficiency of other specified B group vitamins: Secondary | ICD-10-CM | POA: Diagnosis not present

## 2023-05-23 DIAGNOSIS — F3289 Other specified depressive episodes: Secondary | ICD-10-CM | POA: Diagnosis not present

## 2023-05-23 DIAGNOSIS — E782 Mixed hyperlipidemia: Secondary | ICD-10-CM | POA: Diagnosis not present

## 2023-05-23 DIAGNOSIS — R600 Localized edema: Secondary | ICD-10-CM | POA: Diagnosis not present

## 2023-05-23 DIAGNOSIS — I10 Essential (primary) hypertension: Secondary | ICD-10-CM | POA: Diagnosis not present

## 2023-05-23 DIAGNOSIS — K219 Gastro-esophageal reflux disease without esophagitis: Secondary | ICD-10-CM | POA: Diagnosis not present

## 2023-05-23 DIAGNOSIS — J069 Acute upper respiratory infection, unspecified: Secondary | ICD-10-CM

## 2023-05-23 DIAGNOSIS — E039 Hypothyroidism, unspecified: Secondary | ICD-10-CM | POA: Diagnosis not present

## 2023-06-24 ENCOUNTER — Other Ambulatory Visit: Payer: Self-pay | Admitting: Internal Medicine

## 2023-06-25 ENCOUNTER — Other Ambulatory Visit: Payer: Self-pay | Admitting: Internal Medicine

## 2023-08-17 ENCOUNTER — Other Ambulatory Visit: Payer: Self-pay | Admitting: Internal Medicine

## 2023-08-21 ENCOUNTER — Other Ambulatory Visit: Payer: Self-pay | Admitting: Internal Medicine
